# Patient Record
Sex: Female | Born: 1935 | Race: White | Hispanic: No | Smoking: Never smoker
Health system: Southern US, Community
[De-identification: ages and names within clinical notes are randomized; demographics above are authoritative.]

## PROBLEM LIST (undated history)

## (undated) DIAGNOSIS — I471 Supraventricular tachycardia: Secondary | ICD-10-CM

## (undated) DIAGNOSIS — E785 Hyperlipidemia, unspecified: Secondary | ICD-10-CM

## (undated) DIAGNOSIS — N390 Urinary tract infection, site not specified: Secondary | ICD-10-CM

## (undated) DIAGNOSIS — F419 Anxiety disorder, unspecified: Secondary | ICD-10-CM

## (undated) DIAGNOSIS — M19049 Primary osteoarthritis, unspecified hand: Secondary | ICD-10-CM

## (undated) DIAGNOSIS — E039 Hypothyroidism, unspecified: Secondary | ICD-10-CM

## (undated) DIAGNOSIS — C719 Malignant neoplasm of brain, unspecified: Secondary | ICD-10-CM

## (undated) DIAGNOSIS — K635 Polyp of colon: Secondary | ICD-10-CM

## (undated) DIAGNOSIS — A419 Sepsis, unspecified organism: Secondary | ICD-10-CM

## (undated) DIAGNOSIS — M1711 Unilateral primary osteoarthritis, right knee: Secondary | ICD-10-CM

## (undated) HISTORY — PX: KNEE ARTHROSCOPY: SUR90

## (undated) HISTORY — PX: ABDOMINAL HYSTERECTOMY: SHX81

## (undated) HISTORY — PX: CHOLECYSTECTOMY: SHX55

---

## 1999-02-16 ENCOUNTER — Other Ambulatory Visit: Admission: RE | Admit: 1999-02-16 | Discharge: 1999-02-16 | Payer: Self-pay | Admitting: Family Medicine

## 1999-12-04 ENCOUNTER — Encounter: Payer: Self-pay | Admitting: Family Medicine

## 1999-12-04 ENCOUNTER — Ambulatory Visit (HOSPITAL_COMMUNITY): Admission: RE | Admit: 1999-12-04 | Discharge: 1999-12-04 | Payer: Self-pay | Admitting: Family Medicine

## 2000-02-26 ENCOUNTER — Other Ambulatory Visit: Admission: RE | Admit: 2000-02-26 | Discharge: 2000-02-26 | Payer: Self-pay | Admitting: Family Medicine

## 2000-04-10 ENCOUNTER — Encounter: Admission: RE | Admit: 2000-04-10 | Discharge: 2000-04-21 | Payer: Self-pay | Admitting: Orthopedic Surgery

## 2001-01-22 ENCOUNTER — Encounter: Payer: Self-pay | Admitting: Internal Medicine

## 2001-01-22 ENCOUNTER — Ambulatory Visit (HOSPITAL_COMMUNITY): Admission: RE | Admit: 2001-01-22 | Discharge: 2001-01-22 | Payer: Self-pay | Admitting: Internal Medicine

## 2001-04-30 ENCOUNTER — Emergency Department (HOSPITAL_COMMUNITY): Admission: EM | Admit: 2001-04-30 | Discharge: 2001-04-30 | Payer: Self-pay | Admitting: Emergency Medicine

## 2001-05-19 ENCOUNTER — Other Ambulatory Visit: Admission: RE | Admit: 2001-05-19 | Discharge: 2001-05-19 | Payer: Self-pay | Admitting: Internal Medicine

## 2001-05-22 ENCOUNTER — Encounter: Admission: RE | Admit: 2001-05-22 | Discharge: 2001-05-22 | Payer: Self-pay | Admitting: Internal Medicine

## 2001-05-22 ENCOUNTER — Encounter: Payer: Self-pay | Admitting: Internal Medicine

## 2002-03-30 ENCOUNTER — Encounter: Payer: Self-pay | Admitting: Internal Medicine

## 2002-03-30 ENCOUNTER — Ambulatory Visit (HOSPITAL_COMMUNITY): Admission: RE | Admit: 2002-03-30 | Discharge: 2002-03-30 | Payer: Self-pay | Admitting: Internal Medicine

## 2002-08-10 ENCOUNTER — Ambulatory Visit (HOSPITAL_COMMUNITY): Admission: RE | Admit: 2002-08-10 | Discharge: 2002-08-10 | Payer: Self-pay | Admitting: Gastroenterology

## 2002-08-10 ENCOUNTER — Encounter (INDEPENDENT_AMBULATORY_CARE_PROVIDER_SITE_OTHER): Payer: Self-pay | Admitting: Specialist

## 2003-10-20 ENCOUNTER — Ambulatory Visit (HOSPITAL_COMMUNITY): Admission: RE | Admit: 2003-10-20 | Discharge: 2003-10-20 | Payer: Self-pay | Admitting: Internal Medicine

## 2004-05-28 ENCOUNTER — Other Ambulatory Visit: Admission: RE | Admit: 2004-05-28 | Discharge: 2004-05-28 | Payer: Self-pay | Admitting: Internal Medicine

## 2005-07-19 ENCOUNTER — Ambulatory Visit (HOSPITAL_COMMUNITY): Admission: RE | Admit: 2005-07-19 | Discharge: 2005-07-19 | Payer: Self-pay | Admitting: Internal Medicine

## 2006-07-31 ENCOUNTER — Ambulatory Visit (HOSPITAL_COMMUNITY): Admission: RE | Admit: 2006-07-31 | Discharge: 2006-07-31 | Payer: Self-pay | Admitting: Internal Medicine

## 2007-06-19 ENCOUNTER — Other Ambulatory Visit: Admission: RE | Admit: 2007-06-19 | Discharge: 2007-06-19 | Payer: Self-pay | Admitting: Internal Medicine

## 2007-08-04 ENCOUNTER — Ambulatory Visit (HOSPITAL_COMMUNITY): Admission: RE | Admit: 2007-08-04 | Discharge: 2007-08-04 | Payer: Self-pay | Admitting: Internal Medicine

## 2008-08-05 ENCOUNTER — Ambulatory Visit (HOSPITAL_COMMUNITY): Admission: RE | Admit: 2008-08-05 | Discharge: 2008-08-05 | Payer: Self-pay | Admitting: Internal Medicine

## 2009-08-11 ENCOUNTER — Ambulatory Visit (HOSPITAL_COMMUNITY): Admission: RE | Admit: 2009-08-11 | Discharge: 2009-08-11 | Payer: Self-pay | Admitting: Internal Medicine

## 2009-08-16 ENCOUNTER — Encounter: Admission: RE | Admit: 2009-08-16 | Discharge: 2009-08-16 | Payer: Self-pay | Admitting: Internal Medicine

## 2010-08-17 ENCOUNTER — Encounter: Admission: RE | Admit: 2010-08-17 | Discharge: 2010-08-17 | Payer: Self-pay | Admitting: Internal Medicine

## 2010-11-11 ENCOUNTER — Encounter: Payer: Self-pay | Admitting: Internal Medicine

## 2011-03-08 NOTE — Op Note (Signed)
   NAMEHALSEY, PERSAUD                           ACCOUNT NO.:  [id number]   MEDICAL RECORD NO.:  [id number]                   PATIENT TYPE:  AMB   LOCATION:  ENDO                                 FACILITY:  [facility]   PHYSICIAN:  Danise Edge, M.D.                DATE OF BIRTH:  1932-03-17   DATE OF PROCEDURE:  08/10/2002  DATE OF DISCHARGE:                                 OPERATIVE REPORT   PROCEDURE:  Screening colonoscopy.   PROCEDURE INDICATION:  The patient is a 77 year old female born 08/23/1932.  The  patient is scheduled to undergo her first screening colonoscopy with  polypectomy to prevent colon cancer.  She has undergone screening flexible  proctosigmoidoscopies in the past and has no past history of colorectal  neoplasia.   I discussed with the patient the complications associated with colonoscopy  and polypectomy including a 15:1000 risk of bleeding a 4:1000 risk of colon  perforation requiring surgical repair.  The patient has signed the operative  permit.   ENDOSCOPIST:  Danise Edge, M.D.   PREMEDICATION:  Versed 10 mg, fentanyl 50 mcg.   ENDOSCOPE:  Olympus pediatric colonoscope.   DESCRIPTION OF PROCEDURE:  After obtaining informed consent, the patient was  placed in the left lateral decubitus position.  I administered intravenous  Versed and intravenous fentanyl to achieve conscious sedation for the  procedure.  The patient's blood pressure, oxygen saturation, and cardiac  rhythm were monitored throughout the procedure and documented in the medical  record.   Anal inspection was normal.  Digital rectal exam was normal.  The Olympus  pediatric videocolonoscope was introduced into the rectum and advanced to  the cecum.  Colonic preparation for the exam today was excellent.   Rectum:  Normal.  Sigmoid colon and descending colon:  Normal.  Splenic flexure:  Normal.  Transverse colon:  Normal.  Hepatic flexure:  Normal  Ascending colon:  From the proximal  ascending colon, a 0.5-mm sessile polyp  was removed with the cold biopsy forceps.  Cecum and ileocecal valve:  Normal.    ASSESSMENT:  From the proximal ascending colon, a diminutive polyp was  removed with the cold biopsy forceps; otherwise normal proctocolonoscopy to  the cecum.                                                Danise Edge, M.D.    MJ/MEDQ  D:  08/10/2002  T:  08/10/2002  Job:  [id number]   cc:   Georgann Housekeeper, M.D.  [address]  [place]  [place] [address]  Fax: [phone]

## 2011-07-19 ENCOUNTER — Other Ambulatory Visit: Payer: Self-pay | Admitting: Internal Medicine

## 2011-07-19 DIAGNOSIS — Z1231 Encounter for screening mammogram for malignant neoplasm of breast: Secondary | ICD-10-CM

## 2011-08-19 ENCOUNTER — Ambulatory Visit
Admission: RE | Admit: 2011-08-19 | Discharge: 2011-08-19 | Disposition: A | Discharge status: Home / Self Care | Payer: Medicare Other | Source: Ambulatory Visit | Attending: Internal Medicine | Admitting: Internal Medicine

## 2011-08-19 ENCOUNTER — Ambulatory Visit: Payer: Self-pay

## 2011-08-19 DIAGNOSIS — Z1231 Encounter for screening mammogram for malignant neoplasm of breast: Secondary | ICD-10-CM

## 2012-07-29 ENCOUNTER — Other Ambulatory Visit (HOSPITAL_COMMUNITY): Payer: Self-pay | Admitting: Internal Medicine

## 2012-07-29 DIAGNOSIS — Z1231 Encounter for screening mammogram for malignant neoplasm of breast: Secondary | ICD-10-CM

## 2012-08-20 ENCOUNTER — Ambulatory Visit (HOSPITAL_COMMUNITY)
Admission: RE | Admit: 2012-08-20 | Discharge: 2012-08-20 | Disposition: A | Discharge status: Home / Self Care | Payer: Medicare Other | Source: Ambulatory Visit | Attending: Internal Medicine | Admitting: Internal Medicine

## 2012-08-20 DIAGNOSIS — Z1231 Encounter for screening mammogram for malignant neoplasm of breast: Secondary | ICD-10-CM

## 2012-10-21 DIAGNOSIS — N390 Urinary tract infection, site not specified: Secondary | ICD-10-CM

## 2012-10-21 DIAGNOSIS — I471 Supraventricular tachycardia: Secondary | ICD-10-CM

## 2012-10-21 HISTORY — DX: Supraventricular tachycardia: I47.1

## 2012-10-21 HISTORY — DX: Urinary tract infection, site not specified: N39.0

## 2013-03-26 ENCOUNTER — Inpatient Hospital Stay (HOSPITAL_COMMUNITY): Payer: Medicare Other

## 2013-03-26 ENCOUNTER — Inpatient Hospital Stay (HOSPITAL_COMMUNITY)
Admission: EM | Admit: 2013-03-26 | Discharge: 2013-04-02 | DRG: 025 | Disposition: A | Discharge status: Rehabilitation Facility | Payer: Medicare Other | Attending: Internal Medicine | Admitting: Internal Medicine

## 2013-03-26 ENCOUNTER — Other Ambulatory Visit: Payer: Self-pay | Admitting: Neurological Surgery

## 2013-03-26 ENCOUNTER — Emergency Department (HOSPITAL_COMMUNITY): Payer: Medicare Other

## 2013-03-26 ENCOUNTER — Encounter (HOSPITAL_COMMUNITY): Payer: Self-pay | Admitting: *Deleted

## 2013-03-26 DIAGNOSIS — R4182 Altered mental status, unspecified: Secondary | ICD-10-CM

## 2013-03-26 DIAGNOSIS — D496 Neoplasm of unspecified behavior of brain: Secondary | ICD-10-CM

## 2013-03-26 DIAGNOSIS — R000 Tachycardia, unspecified: Secondary | ICD-10-CM | POA: Diagnosis not present

## 2013-03-26 DIAGNOSIS — E785 Hyperlipidemia, unspecified: Secondary | ICD-10-CM | POA: Diagnosis present

## 2013-03-26 DIAGNOSIS — G9389 Other specified disorders of brain: Secondary | ICD-10-CM

## 2013-03-26 DIAGNOSIS — G8190 Hemiplegia, unspecified affecting unspecified side: Secondary | ICD-10-CM | POA: Diagnosis present

## 2013-03-26 DIAGNOSIS — E039 Hypothyroidism, unspecified: Secondary | ICD-10-CM

## 2013-03-26 DIAGNOSIS — R34 Anuria and oliguria: Secondary | ICD-10-CM | POA: Diagnosis not present

## 2013-03-26 DIAGNOSIS — G936 Cerebral edema: Secondary | ICD-10-CM | POA: Diagnosis present

## 2013-03-26 DIAGNOSIS — F411 Generalized anxiety disorder: Secondary | ICD-10-CM | POA: Diagnosis present

## 2013-03-26 DIAGNOSIS — R7309 Other abnormal glucose: Secondary | ICD-10-CM | POA: Diagnosis not present

## 2013-03-26 DIAGNOSIS — K219 Gastro-esophageal reflux disease without esophagitis: Secondary | ICD-10-CM | POA: Diagnosis present

## 2013-03-26 DIAGNOSIS — D320 Benign neoplasm of cerebral meninges: Principal | ICD-10-CM | POA: Diagnosis present

## 2013-03-26 DIAGNOSIS — T380X5A Adverse effect of glucocorticoids and synthetic analogues, initial encounter: Secondary | ICD-10-CM | POA: Diagnosis not present

## 2013-03-26 DIAGNOSIS — G939 Disorder of brain, unspecified: Secondary | ICD-10-CM

## 2013-03-26 DIAGNOSIS — R471 Dysarthria and anarthria: Secondary | ICD-10-CM | POA: Diagnosis present

## 2013-03-26 DIAGNOSIS — I10 Essential (primary) hypertension: Secondary | ICD-10-CM | POA: Diagnosis present

## 2013-03-26 DIAGNOSIS — D649 Anemia, unspecified: Secondary | ICD-10-CM | POA: Diagnosis not present

## 2013-03-26 HISTORY — DX: Unilateral primary osteoarthritis, right knee: M17.11

## 2013-03-26 HISTORY — DX: Hypothyroidism, unspecified: E03.9

## 2013-03-26 HISTORY — DX: Polyp of colon: K63.5

## 2013-03-26 HISTORY — DX: Hyperlipidemia, unspecified: E78.5

## 2013-03-26 HISTORY — DX: Primary osteoarthritis, unspecified hand: M19.049

## 2013-03-26 HISTORY — DX: Anxiety disorder, unspecified: F41.9

## 2013-03-26 LAB — POCT I-STAT, CHEM 8
BUN: 18 mg/dL (ref 6–23)
Calcium, Ion: 1.17 mmol/L (ref 1.13–1.30)
Glucose, Bld: 189 mg/dL — ABNORMAL HIGH (ref 70–99)
HCT: 43 % (ref 36.0–46.0)
Hemoglobin: 14.6 g/dL (ref 12.0–15.0)
Potassium: 4 mEq/L (ref 3.5–5.1)
Sodium: 138 mEq/L (ref 135–145)

## 2013-03-26 LAB — CBC WITH DIFFERENTIAL/PLATELET
Eosinophils Relative: 0 % (ref 0–5)
HCT: 40 % (ref 36.0–46.0)
Lymphocytes Relative: 8 % — ABNORMAL LOW (ref 12–46)
Lymphs Abs: 1 10*3/uL (ref 0.7–4.0)
MCHC: 35 g/dL (ref 30.0–36.0)
Neutro Abs: 10.7 10*3/uL — ABNORMAL HIGH (ref 1.7–7.7)
Neutrophils Relative %: 91 % — ABNORMAL HIGH (ref 43–77)
Platelets: 218 10*3/uL (ref 150–400)
RBC: 4.65 MIL/uL (ref 3.87–5.11)
RDW: 13.2 % (ref 11.5–15.5)

## 2013-03-26 LAB — URINE MICROSCOPIC-ADD ON

## 2013-03-26 LAB — COMPREHENSIVE METABOLIC PANEL
ALT: 26 U/L (ref 0–35)
Alkaline Phosphatase: 64 U/L (ref 39–117)
Chloride: 95 mEq/L — ABNORMAL LOW (ref 96–112)
GFR calc Af Amer: >90 mL/min (ref >90)
GFR calc non Af Amer: 83 mL/min — ABNORMAL LOW (ref >90)
Total Bilirubin: 0.6 mg/dL (ref 0.3–1.2)

## 2013-03-26 LAB — URINALYSIS, ROUTINE W REFLEX MICROSCOPIC
Glucose, UA: 100 mg/dL — AB
Leukocytes, UA: NEGATIVE
Protein, ur: >300 mg/dL — AB
Urobilinogen, UA: 0.2 mg/dL (ref 0.0–1.0)
pH: 7.5 (ref 5.0–8.0)

## 2013-03-26 LAB — TSH: TSH: 1.135 u[IU]/mL (ref 0.350–4.500)

## 2013-03-26 LAB — TROPONIN I: Troponin I: <0.3 ng/mL (ref <0.30)

## 2013-03-26 MED ORDER — PANTOPRAZOLE SODIUM 40 MG PO TBEC
40.0000 mg | DELAYED_RELEASE_TABLET | Freq: Every day | ORAL | Status: DC
  Administered 2013-03-26 – 2013-03-29 (×4): 40 mg via ORAL
  Filled 2013-03-26 (×4): qty 1

## 2013-03-26 MED ORDER — HYDROMORPHONE HCL PF 1 MG/ML IJ SOLN: 1.0000 mg | INTRAMUSCULAR | Status: DC | PRN

## 2013-03-26 MED ORDER — GADOBENATE DIMEGLUMINE 529 MG/ML IV SOLN
13.0000 mL | Freq: Once | INTRAVENOUS | Status: AC
  Administered 2013-03-26: 13 mL via INTRAVENOUS

## 2013-03-26 MED ORDER — ACETAMINOPHEN 325 MG PO TABS
650.0000 mg | ORAL_TABLET | Freq: Four times a day (QID) | ORAL | Status: DC | PRN
  Filled 2013-03-26: qty 2

## 2013-03-26 MED ORDER — SIMVASTATIN 20 MG PO TABS
20.0000 mg | ORAL_TABLET | Freq: Every evening | ORAL | Status: DC
  Administered 2013-03-26 – 2013-03-31 (×6): 20 mg via ORAL
  Filled 2013-03-26 (×7): qty 1

## 2013-03-26 MED ORDER — SODIUM CHLORIDE 0.9 % IJ SOLN
3.0000 mL | Freq: Two times a day (BID) | INTRAMUSCULAR | Status: DC
  Administered 2013-03-27 – 2013-04-01 (×8): 3 mL via INTRAVENOUS

## 2013-03-26 MED ORDER — ONDANSETRON HCL 4 MG/2ML IJ SOLN: 4.0000 mg | Freq: Four times a day (QID) | INTRAMUSCULAR | Status: DC | PRN

## 2013-03-26 MED ORDER — LEVOTHYROXINE SODIUM 75 MCG PO TABS
75.0000 ug | ORAL_TABLET | Freq: Every day | ORAL | Status: DC
  Administered 2013-03-27 – 2013-04-01 (×5): 75 ug via ORAL
  Filled 2013-03-26 (×7): qty 1

## 2013-03-26 MED ORDER — MAGNESIUM CHLORIDE 64 MG PO TBEC
1.0000 | DELAYED_RELEASE_TABLET | Freq: Every day | ORAL | Status: DC
  Administered 2013-03-27 – 2013-04-01 (×5): 64 mg via ORAL
  Filled 2013-03-26 (×7): qty 1

## 2013-03-26 MED ORDER — DEXAMETHASONE SODIUM PHOSPHATE 4 MG/ML IJ SOLN
12.0000 mg | Freq: Four times a day (QID) | INTRAMUSCULAR | Status: DC
  Administered 2013-03-27 – 2013-03-29 (×13): 12 mg via INTRAVENOUS
  Administered 2013-03-30: 10 mg via INTRAVENOUS
  Administered 2013-03-30: 12 mg via INTRAVENOUS
  Filled 2013-03-26 (×15): qty 3
  Filled 2013-03-26: qty 1
  Filled 2013-03-26 (×2): qty 3

## 2013-03-26 MED ORDER — LABETALOL HCL 5 MG/ML IV SOLN
10.0000 mg | INTRAVENOUS | Status: DC | PRN
  Administered 2013-03-30 (×6): 5 mg via INTRAVENOUS
  Administered 2013-03-30: 10 mg via INTRAVENOUS

## 2013-03-26 MED ORDER — ONDANSETRON HCL 4 MG PO TABS: 4.0000 mg | ORAL_TABLET | Freq: Four times a day (QID) | ORAL | Status: DC | PRN

## 2013-03-26 MED ORDER — ONDANSETRON HCL 4 MG/2ML IJ SOLN
4.0000 mg | Freq: Three times a day (TID) | INTRAMUSCULAR | Status: DC | PRN
  Administered 2013-03-26: 4 mg via INTRAVENOUS
  Filled 2013-03-26 (×2): qty 2

## 2013-03-26 MED ORDER — DEXAMETHASONE SODIUM PHOSPHATE 10 MG/ML IJ SOLN
10.0000 mg | Freq: Once | INTRAMUSCULAR | Status: AC
  Administered 2013-03-26: 10 mg via INTRAVENOUS
  Filled 2013-03-26: qty 1

## 2013-03-26 MED ORDER — SODIUM CHLORIDE 0.9 % IV SOLN
INTRAVENOUS | Status: AC
  Administered 2013-03-26: 06:00:00 via INTRAVENOUS

## 2013-03-26 MED ORDER — SODIUM CHLORIDE 0.9 % IV SOLN
INTRAVENOUS | Status: AC
  Administered 2013-03-26: 12:00:00 via INTRAVENOUS

## 2013-03-26 MED ORDER — SODIUM CHLORIDE 0.9 % IV SOLN
500.0000 mg | Freq: Two times a day (BID) | INTRAVENOUS | Status: DC
  Administered 2013-03-26 – 2013-03-30 (×9): 500 mg via INTRAVENOUS
  Filled 2013-03-26 (×10): qty 5

## 2013-03-26 MED ORDER — DEXAMETHASONE SODIUM PHOSPHATE 4 MG/ML IJ SOLN
4.0000 mg | Freq: Four times a day (QID) | INTRAMUSCULAR | Status: DC
  Administered 2013-03-26 (×2): 4 mg via INTRAVENOUS
  Filled 2013-03-26 (×4): qty 1

## 2013-03-26 MED ORDER — SODIUM CHLORIDE 0.9 % IV BOLUS (SEPSIS)
500.0000 mL | Freq: Once | INTRAVENOUS | Status: AC
  Administered 2013-03-26: 500 mL via INTRAVENOUS

## 2013-03-26 MED ORDER — HYDROMORPHONE HCL PF 1 MG/ML IJ SOLN: 0.5000 mg | INTRAMUSCULAR | Status: DC | PRN

## 2013-03-26 MED ORDER — OMEGA-3-ACID ETHYL ESTERS 1 G PO CAPS
2.0000 g | ORAL_CAPSULE | Freq: Every day | ORAL | Status: DC
  Filled 2013-03-26: qty 2

## 2013-03-26 MED ORDER — ALPRAZOLAM 0.5 MG PO TABS
1.0000 mg | ORAL_TABLET | Freq: Two times a day (BID) | ORAL | Status: DC
  Administered 2013-03-26: 1 mg via ORAL
  Filled 2013-03-26 (×2): qty 1

## 2013-03-26 MED ORDER — FAMOTIDINE IN NACL 20-0.9 MG/50ML-% IV SOLN
20.0000 mg | Freq: Two times a day (BID) | INTRAVENOUS | Status: DC
  Administered 2013-03-26 – 2013-03-27 (×2): 20 mg via INTRAVENOUS
  Filled 2013-03-26 (×3): qty 50

## 2013-03-26 MED ORDER — FISH OIL 1000 MG PO CAPS: 1000.0000 mg | ORAL_CAPSULE | Freq: Two times a day (BID) | ORAL | Status: DC

## 2013-03-26 MED ORDER — ACETAMINOPHEN 650 MG RE SUPP: 650.0000 mg | Freq: Four times a day (QID) | RECTAL | Status: DC | PRN

## 2013-03-26 MED ORDER — OMEGA-3-ACID ETHYL ESTERS 1 G PO CAPS
1.0000 g | ORAL_CAPSULE | Freq: Every day | ORAL | Status: DC
  Filled 2013-03-26: qty 1

## 2013-03-26 NOTE — H&P (Signed)
[facility] History and Physical  Earlee Herald NWG:[id number] DOB: January 19, 1932 DOA: 03/26/2013  Referring physician: ER physician. PCP: No primary provider on file. Dr. Tyson Dense.  Chief Complaint: Right extremity weakness and vomiting.  HPI: Meghan Yu is a 79 y.o. female with known history of hyperlipidemia and hypothyroidism was noticed by family that patient was not using her right side since yesterday morning. Patient also had vomiting with headache. Patient had 2-3 falls also. By evening patient's family noticed that patient's symptoms were getting more persistent and was brought to the ER. CT head shows large frontotemporal brain mass with vasogenic edema and midline shift. This time on-call neurosurgeon Dr. Yetta Barre was consulted by ER physician. Dr. Laural Benes as requested Decadron 10 mg IV stat followed by 4 mg IV every 6 and MRI brain with and without contrast. Patient will be admitted for further management.  Review of Systems: As presented in the history of presenting illness, rest negative.  Past Medical History  Diagnosis Date  . Hypothyroidism   . Hyperlipidemia    Past Surgical History  Procedure Laterality Date  . Knee arthroscopy    . Cholecystectomy    . Abdominal hysterectomy     Social History:  reports that she has never smoked. She does not have any smokeless tobacco history on file. She reports that she does not drink alcohol or use illicit drugs. At home. where does patient live-- Can do ADLs. Can patient participate in ADLs?  No Known Allergies  Family History  Problem Relation Age of Onset  . Stroke Mother       Prior to Admission medications   Medication Sig Start Date End Date Taking? Authorizing Provider  ALPRAZolam Prudy Feeler) 1 MG tablet Take 1 mg by mouth 2 (two) times daily.   Yes Historical Provider, MD  calcium carbonate (OS-CAL) 600 MG TABS Take 600 mg by mouth 2 (two) times daily with a meal.   Yes Historical Provider, MD   Cholecalciferol (VITAMIN D) 2000 UNITS CAPS Take 1 capsule by mouth daily.   Yes Historical Provider, MD  esomeprazole (NEXIUM) 40 MG capsule Take 40 mg by mouth daily before breakfast.   Yes Historical Provider, MD  estradiol (ESTRACE) 1 MG tablet Take 1 mg by mouth daily.   Yes Historical Provider, MD  levothyroxine (SYNTHROID, LEVOTHROID) 75 MCG tablet Take 75 mcg by mouth daily before breakfast.   Yes Historical Provider, MD  magnesium chloride (SLOW-MAG) 64 MG TBEC Take 1 tablet by mouth daily.   Yes Historical Provider, MD  Multiple Vitamin (MULTIVITAMIN WITH MINERALS) TABS Take 1 tablet by mouth daily.   Yes Historical Provider, MD  Omega-3 Fatty Acids (FISH OIL) 1000 MG CAPS Take 1,000-2,000 mg by mouth 2 (two) times daily. Takes 2000 mg in the morning and 1000 mg in the evening   Yes Historical Provider, MD  simvastatin (ZOCOR) 20 MG tablet Take 20 mg by mouth every evening.   Yes Historical Provider, MD  sulindac (CLINORIL) 200 MG tablet Take 200 mg by mouth daily.   Yes Historical Provider, MD   Physical Exam: Filed Vitals:   03/26/13 0308 03/26/13 0328 03/26/13 0549 03/26/13 0600  BP: 183/101 161/90 162/92 150/75  Pulse: 137 125 121 119  Temp: 98.7 F (37.1 C)     TempSrc: Oral     Resp:  29 28 27   Height:  5\' 1"  (1.549 m)    Weight:  58.968 kg (130 lb)    SpO2: 95% 95% 95% 93%  General:  Well-developed and nourished.  Eyes: Anicteric no pallor.  ENT: No discharge from the ears eyes nose mouth.  Neck: No mass felt.  Cardiovascular: S1-S2 heard tachycardic.  Respiratory: No rhonchi or crepitations.  Abdomen: Soft nontender bowel sounds present.  Skin: No rash.  Musculoskeletal: No edema.  Psychiatric: Appears normal.  Neurologic: Alert awake oriented to time place and person. Mild weakness of the right upper and lower extremities. Right facial droop. Tongue is midline. PERRLA positive.  Labs on Admission:  Basic Metabolic Panel:  Recent Labs Lab  03/26/13 0327  NA 138  K 4.0  CL 100  GLUCOSE 189*  BUN 18  CREATININE 0.70   Liver Function Tests: No results found for this basename: AST, ALT, ALKPHOS, BILITOT, PROT, ALBUMIN,  in the last 168 hours No results found for this basename: LIPASE, AMYLASE,  in the last 168 hours No results found for this basename: AMMONIA,  in the last 168 hours CBC:  Recent Labs Lab 03/26/13 0320 03/26/13 0327  WBC 11.8*  --   NEUTROABS 10.7*  --   HGB 14.0 14.6  HCT 40.0 43.0  MCV 86.0  --   PLT 218  --    Cardiac Enzymes:  Recent Labs Lab 03/26/13 0340  TROPONINI <0.30    BNP (last 3 results) No results found for this basename: PROBNP,  in the last 8760 hours CBG: No results found for this basename: GLUCAP,  in the last 168 hours  Radiological Exams on Admission: Dg Chest 2 View  03/26/2013   *RADIOLOGY REPORT*  Clinical Data: Nausea and vomiting.  Confusion and right-sided facial droop.  CHEST - 2 VIEW  Comparison: None.  Findings: The lungs are mildly hypoexpanded; minimal left-sided atelectasis is seen.  No definite pleural effusion or pneumothorax is seen.  The heart is normal in size; the mediastinal contour is within normal limits.  No acute osseous abnormalities are seen.  Clips are noted within the right upper quadrant, reflecting prior cholecystectomy.  IMPRESSION: Lungs mildly hypoexpanded, with minimal left-sided atelectasis.   Original Report Authenticated By: Tonia Ghent, M.D.   Ct Head Wo Contrast  03/26/2013   *RADIOLOGY REPORT*  Clinical Data: Nausea and vomiting.  History of recent falls for 2 weeks.  Right-sided weakness, confusion and right-sided facial droop.  CT HEAD WITHOUT CONTRAST  Technique:  Contiguous axial images were obtained from the base of the skull through the vertex without contrast.  Comparison: None.  Findings:   There is a large mildly hyperdense and slightly heterogeneous mass noted at the high left frontoparietal region, measuring approximately 5.0 x  4.4 cm, abutting the left anterior falx cerebri.  Marked surrounding vasogenic edema is noted, extending inferiorly along the left frontal lobe.  The appearance is suspicious for a large meningioma, though MRI would be helpful for further evaluation.  1.3 cm of rightward midline shift is noted, with likely subfalcine herniation, and there is mass effect on the frontal horn of the left lateral ventricle.  No definite hemorrhagic transformation is seen.  Small chronic lacunar infarcts are seen within the left basal ganglia.  The posterior fossa, including the cerebellum, brainstem and fourth ventricle, is within normal limits.  There is no evidence of hydrocephalus.  There is no evidence of fracture; visualized osseous structures are unremarkable in appearance.  The orbits are within normal limits. The paranasal sinuses and mastoid air cells are well-aerated.  No significant soft tissue abnormalities are seen.  IMPRESSION:  1.  Large 5.0 x  4.4 cm mildly hyperdense mass at the high left frontoparietal region, abutting the left anterior falx cerebri. Marked surrounding vasogenic edema noted.  This is suspicious for a large meningioma, though MRI is recommended for further evaluation.  1.3 cm of rightward midline shift noted, with likely subfalcine herniation, and mass effect on the frontal horn of the left lateral ventricle.  No definite hemorrhagic transformation seen.  2.  Small chronic lacunar infarcts within the left basal ganglia.  These results were called by telephone on 03/26/2013 at 04:59 a.m. to Dr. Geoffery Lyons, who verbally acknowledged these results.   Original Report Authenticated By: Tonia Ghent, M.D.     Assessment/Plan Principal Problem:   Brain tumor Active Problems:   Hypothyroidism   Hyperlipidemia   1. Brain tumor with vasogenic edema and midline shift - neurosurgeon on call Dr. Yetta Barre has been consulted and patient has been placed on Decadron IV with neuro checks. MRI brain with and  without contrast has been ordered. Family has requested Dr. Danielle Dess neurosurgeon and have conveyed this message to Dr. Yetta Barre. 2. History of hypothyroidism and hyperlipidemia - continue home medications.    Code Status: Full code.  Family Communication: Patient's husband and daughter at the bedside.  Disposition Plan: Admit to inpatient.    Averyana Pillars N. [facility] Pager [phone].  If 7PM-7AM, please contact night-coverage [email or web address] Password [facility] 03/26/2013, 6:26 AM

## 2013-03-26 NOTE — ED Notes (Signed)
Pt returned from MRI. Failed swallow screen per previous RN. Called 3 W, reporting ready for patient. Will transport patient upstairs.

## 2013-03-26 NOTE — Consult Note (Addendum)
PULMONARY  / CRITICAL CARE MEDICINE  Name: Meghan Yu MRN: [id number] DOB: 12-08-1931    ADMISSION DATE:  03/26/2013 CONSULTATION DATE:  03/26/13 / [name] MD :  Barnett Abu PRIMARY SERVICE: Neurosurgery  CHIEF COMPLAINT:  Right hemiparesis and lethargy   BRIEF PATIENT DESCRIPTION:  79 years old female with PMH relevant for hypothyroidism and dyslipidemia. Admitted with right hemiparesis and lethargy. Brain imaging showed a large left frontotemporal mass with vasogenic edema and midline shift.  SIGNIFICANT EVENTS / STUDIES:  MRI of the brain Findings consistent with a left hemispheric frontal and posterior  frontal extra-axial mass, likely meningioma. There is invasion of  the superior sagittal sinus without occlusion. Moderate associated  edema in the left frontal and posterior frontal white matter with  left to right shift of approximately 1 cm.   LINES / TUBES: Peripheral IV's  CULTURES: Not ordered  ANTIBIOTICS: No antibiotics  HISTORY OF PRESENT ILLNESS:   79 years old female with PMH relevant for hypothyroidism and dyslipidemia. Admitted with right hemiparesis and lethargy. Brain imaging showed a large left frontotemporal mass with vasogenic edema and midline shift. Started on IV decadron by neurosurgery. Over the course of the day she has worsened lethargy and right hemiparesis. Her vital signs are relevant for HTN and tachycardia. At the time of my exam the patient is lethargic but arousable, she is oriented  X 3 and is saturating fine on RA. Per Neurosurgery note, the patient is scheduled for surgical resection on Tuesday.  PAST MEDICAL HISTORY :  Past Medical History  Diagnosis Date  . Hypothyroidism   . Hyperlipidemia    Past Surgical History  Procedure Laterality Date  . Knee arthroscopy    . Cholecystectomy    . Abdominal hysterectomy     Prior to Admission medications   Medication Sig Start Date End Date Taking? Authorizing Provider  ALPRAZolam  Prudy Feeler) 1 MG tablet Take 1 mg by mouth 2 (two) times daily.   Yes Historical Provider, MD  calcium carbonate (OS-CAL) 600 MG TABS Take 600 mg by mouth 2 (two) times daily with a meal.   Yes Historical Provider, MD  Cholecalciferol (VITAMIN D) 2000 UNITS CAPS Take 1 capsule by mouth daily.   Yes Historical Provider, MD  esomeprazole (NEXIUM) 40 MG capsule Take 40 mg by mouth daily before breakfast.   Yes Historical Provider, MD  estradiol (ESTRACE) 1 MG tablet Take 1 mg by mouth daily.   Yes Historical Provider, MD  levothyroxine (SYNTHROID, LEVOTHROID) 75 MCG tablet Take 75 mcg by mouth daily before breakfast.   Yes Historical Provider, MD  magnesium chloride (SLOW-MAG) 64 MG TBEC Take 1 tablet by mouth daily.   Yes Historical Provider, MD  Multiple Vitamin (MULTIVITAMIN WITH MINERALS) TABS Take 1 tablet by mouth daily.   Yes Historical Provider, MD  Omega-3 Fatty Acids (FISH OIL) 1000 MG CAPS Take 1,000-2,000 mg by mouth 2 (two) times daily. Takes 2000 mg in the morning and 1000 mg in the evening   Yes Historical Provider, MD  simvastatin (ZOCOR) 20 MG tablet Take 20 mg by mouth every evening.   Yes Historical Provider, MD  sulindac (CLINORIL) 200 MG tablet Take 200 mg by mouth daily.   Yes Historical Provider, MD   No Known Allergies  FAMILY HISTORY:  Family History  Problem Relation Age of Onset  . Stroke Mother    SOCIAL HISTORY:  reports that she has never smoked. She does not have any smokeless tobacco history on file. She reports  that she does not drink alcohol or use illicit drugs.  REVIEW OF SYSTEMS:   All systems reviewed and found negative except for what I mentioned in the HPI.  SUBJECTIVE:   VITAL SIGNS: Temp:  [98.5 F (36.9 C)-99.8 F (37.7 C)] 99.6 F (37.6 C) (06/06 2000) Pulse Rate:  [117-137] 120 (06/06 2000) Resp:  [16-29] 16 (06/06 2000) BP: (150-183)/(75-101) 170/101 mmHg (06/06 2000) SpO2:  [92 %-96 %] 92 % (06/06 2000) Weight:  [130 lb (58.968 kg)-134 lb  (60.782 kg)] 134 lb (60.782 kg) (06/06 0835) HEMODYNAMICS:   VENTILATOR SETTINGS:   INTAKE / OUTPUT: Intake/Output     06/06 0701 - 06/07 0700   P.O. 360   Total Intake(mL/kg) 360 (5.9)   Urine (mL/kg/hr) 475 (0.5)   Total Output 475   Net -115         PHYSICAL EXAMINATION: General: No apparent distress. Lethargic but arousable Eyes: Anicteric sclerae. ENT: Oropharynx clear. Moist mucous membranes. No thrush Lymph: No cervical, supraclavicular, or axillary lymphadenopathy. Heart: Normal S1, S2. No murmurs, rubs, or gallops appreciated. No bruits, equal pulses. Lungs: Normal excursion, no dullness to percussion. Good air movement bilaterally, without wheezes or crackles. Normal upper airway sounds without evidence of stridor. Abdomen: Abdomen soft, non-tender and not distended, normoactive bowel sounds. No hepatosplenomegaly or masses. Musculoskeletal: No clubbing or synovitis. Skin: No rashes or lesions Neuro: Right facial droop, Dense right hemiparesis. Lethargic. Able to protect her airway. Oriented x 3  LABS:  Recent Labs Lab 03/26/13 0320 03/26/13 0327 03/26/13 0340 03/26/13 1052  HGB 14.0 14.6  --   --   WBC 11.8*  --   --   --   PLT 218  --   --   --   NA  --  138  --  136  K  --  4.0  --  3.5  CL  --  100  --  95*  CO2  --   --   --  26  GLUCOSE  --  189*  --  202*  BUN  --  18  --  15  CREATININE  --  0.70  --  0.64  CALCIUM  --   --   --  9.8  AST  --   --   --  28  ALT  --   --   --  26  ALKPHOS  --   --   --  64  BILITOT  --   --   --  0.6  PROT  --   --   --  8.4*  ALBUMIN  --   --   --  4.6  TROPONINI  --   --  <0.30  --    No results found for this basename: GLUCAP,  in the last 168 hours  CXR:  No acute lung infiltrates.  EKG: Sinus tachycardia. QTc 391  ASSESSMENT / PLAN:  PULMONARY A: 1) No issues. Saturating 92 to 96% on RA.  2) Adequate airway protection P:   - Close monitoring in the ICU for inability to protect her  airway.  CARDIOVASCULAR A:  1) Hypertension and tachycardia of unclear etiology P:  - We will allow SBP up to 180 to maintain brain perfusion.  RENAL A:   1) No issues P:   - Will follow BMP  GASTROINTESTINAL A:   1) No issues. Passed swallow evaluation. P:   - Continue protonix  HEMATOLOGIC A:   1) No issues P:  -  Will follow CBC  INFECTIOUS A:   1) No issues   ENDOCRINE A:   1) Hypothyroidism  P:   - Continue synthroid  NEUROLOGIC A:   1) Right frontal mass with vasogenic edema and midline shift P:   - Continue decadron per neurosurgery - Plan for surgery next Tuesday - AMS  likely related to high dose steroids + mass effect + benzos. Will treat with Haldol PRN.  - Will avoid benzodiazepines    I have personally obtained a history, examined the patient, evaluated laboratory and imaging results, formulated the assessment and plan and placed orders. CRITICAL CARE: Critical Care Time devoted to patient care services described in this note is 45 minutes.   Overton Mam, MD Pulmonary and Critical Care Medicine [facility] Pager: [phone]  03/26/2013, 9:47 PM

## 2013-03-26 NOTE — Progress Notes (Signed)
Patient ID: Meghan Yu, female   DOB: [birth year], 79 y.o.   MRN: [id number] Patient was evaluated today and demonstrates that she has decreased level of consciousness. She is any paretic on the right worse than she was this morning. She is hypertensive and tachycardic. I discussed situation Dr. Pete Glatter and I am transferring the patient to the intensive care unit will ask critical care medicine to see the patient and evaluate her. Surgery is planned for Tuesday. I discussed situation with the family and they're agreeable.

## 2013-03-26 NOTE — Progress Notes (Signed)
Utilization review completed. Nawaf Strange, RN, BSN. 

## 2013-03-26 NOTE — ED Provider Notes (Signed)
History     CSN: [id number]  Arrival date & time 03/26/13  0258   First MD Initiated Contact with Patient 03/26/13 [phone]      Chief Complaint  Patient presents with  . Extremity Weakness  . Stroke Symptoms  . Emesis    (Consider location/radiation/quality/duration/timing/severity/associated sxs/prior treatment) HPI Comments: Patient brought by family for evaluation of progressive weakness, falling for the past three weeks.  It became worse today and she has apparently fallen three times.  She seemed more confused this evening and vomited once.  Family reports a right sided facial droop, unsteady gait, and difficulty speaking that was noticed prior to coming here.  She was last seen without this at 20:30.  Patient is a 79 y.o. female presenting with extremity weakness. The history is provided by the patient.  Extremity Weakness This is a new problem. Episode onset: 3 weeks ago. The problem occurs constantly. The problem has been gradually worsening. Pertinent negatives include no chest pain and no shortness of breath. Nothing aggravates the symptoms. Nothing relieves the symptoms. She has tried nothing for the symptoms. The treatment provided no relief.    History reviewed. No pertinent past medical history.  Past Surgical History  Procedure Laterality Date  . Knee arthroscopy    . Cholecystectomy    . Abdominal hysterectomy      No family history on file.  History  Substance Use Topics  . Smoking status: Never Smoker   . Smokeless tobacco: Not on file  . Alcohol Use: No    OB History   Grav Para Term Preterm Abortions TAB SAB Ect Mult Living                  Review of Systems  Respiratory: Negative for shortness of breath.   Cardiovascular: Negative for chest pain.  Musculoskeletal: Positive for extremity weakness.  All other systems reviewed and are negative.    Allergies  Review of patient's allergies indicates no known allergies.  Home Medications  No  current outpatient prescriptions on file.  BP 161/90  Pulse 125  Temp(Src) 98.7 F (37.1 C) (Oral)  Resp 29  Ht 5\' 1"  (1.549 m)  Wt 130 lb (58.968 kg)  BMI 24.58 kg/m2  SpO2 95%  Physical Exam  Nursing note and vitals reviewed. Constitutional: She is oriented to person, place, and time.  Elderly female, no acute distress.    HENT:  Head: Normocephalic and atraumatic.  Mouth/Throat: Oropharynx is clear and moist.  Eyes: EOM are normal. Pupils are equal, round, and reactive to light.  Neck: Normal range of motion. Neck supple.  Cardiovascular: Normal rate and regular rhythm.   No murmur heard. Pulmonary/Chest: Effort normal and breath sounds normal. No respiratory distress. She has no wheezes.  Abdominal: Soft. Bowel sounds are normal. She exhibits no distension. There is no tenderness.  Musculoskeletal: Normal range of motion. She exhibits no edema.  Lymphadenopathy:    She has no cervical adenopathy.  Neurological: She is alert and oriented to person, place, and time.  There is a right-sided facial droop noted.  There is weakness in the right hand grip.  She is slow to respond to questions and appears to have a fine tremor noted with rest.  Skin: Skin is warm and dry.    ED Course  Procedures (including critical care time)  Labs Reviewed  CBC WITH DIFFERENTIAL - Abnormal; Notable for the following:    WBC 11.8 (*)    Neutrophils Relative % 91 (*)  Neutro Abs 10.7 (*)    Lymphocytes Relative 8 (*)    Monocytes Relative 1 (*)    All other components within normal limits  POCT I-STAT, CHEM 8 - Abnormal; Notable for the following:    Glucose, Bld 189 (*)    All other components within normal limits  TROPONIN I  URINALYSIS, ROUTINE W REFLEX MICROSCOPIC   No results found.   No diagnosis found.   Date: 03/26/2013  Rate: 136  Rhythm: sinus tachycardia  QRS Axis: normal  Intervals: normal  ST/T Wave abnormalities: normal  Conduction Disutrbances:none  Narrative  Interpretation:   Old EKG Reviewed: none available    MDM  The workup unfortunately reveals a large frontal meningioma with surrounding edema and midline shift.  I have spoken with Dr. Yetta Barre from neurosurgery who recommends decadron and admission to medicine.  Decadron and mri ordered.  Dr. Toniann Fail to see and admit patient.        Geoffery Lyons, MD 03/26/13 [phone]

## 2013-03-26 NOTE — ED Notes (Signed)
Pt. Woke up n/v.  Pt. Took xanax ~ MN, pain pill 2000, and ibuprofen 30 minutes ago. Pt. Falling frequently x 2 weeks ago. Inc. Weakness on rt. Side.  Received cortisone shot yesterday. Inc. Confusion. Rt. Facial droop, slight rt. Tongue deviation, weak rt. Grip, unsteady gait, dysarthric speech;

## 2013-03-26 NOTE — Progress Notes (Signed)
Addressed BP 170/101 HR 120 with Dr. Pete Glatter and Dr. Danielle Dess.  Awaiting orders for hypertension and ICU transfer.  Patient resting comfortably at this time.  Meghan Yu 9:12 PM 03/26/2013

## 2013-03-26 NOTE — Consult Note (Signed)
Reason for Consult: Brain tumor, meningioma left frontal Referring Physician: Leanne Sisler is an 79 y.o. female.  HPI: Patient is 79 year old lady who's had 2 weeks of progressive weakness in the right upper extremity with facial weakness when her daughter noted that she thought she had a stroke the weakness continued it worse to the point that she couldn't walk she also had apraxia and right upper extremity which is brought into the emergency department or CT and MRI demonstrated the presence of is large frontal tumor that appears en plaque to the dura and is consistent with meningioma there is significant shift and mass effect and significant edema in the left peritumoral region.  Past Medical History  Diagnosis Date  . Hypothyroidism   . Hyperlipidemia     Past Surgical History  Procedure Laterality Date  . Knee arthroscopy    . Cholecystectomy    . Abdominal hysterectomy      Family History  Problem Relation Age of Onset  . Stroke Mother     Social History:  reports that she has never smoked. She does not have any smokeless tobacco history on file. She reports that she does not drink alcohol or use illicit drugs.  Allergies: No Known Allergies  Medications: I have reviewed the patient's current medications.  Results for orders placed during the hospital encounter of 03/26/13 (from the past 48 hour(s))  CBC WITH DIFFERENTIAL     Status: Abnormal   Collection Time    03/26/13  3:20 AM      Result Value Range   WBC 11.8 (*) 4.0 - 10.5 K/uL   RBC 4.65  3.87 - 5.11 MIL/uL   Hemoglobin 14.0  12.0 - 15.0 g/dL   HCT 16.1  09.6 - 04.5 %   MCV 86.0  78.0 - 100.0 fL   MCH 30.1  26.0 - 34.0 pg   MCHC 35.0  30.0 - 36.0 g/dL   RDW 40.9  81.1 - 91.4 %   Platelets 218  150 - 400 K/uL   Neutrophils Relative % 91 (*) 43 - 77 %   Neutro Abs 10.7 (*) 1.7 - 7.7 K/uL   Lymphocytes Relative 8 (*) 12 - 46 %   Lymphs Abs 1.0  0.7 - 4.0 K/uL   Monocytes Relative 1 (*) 3 - 12 %    Monocytes Absolute 0.1  0.1 - 1.0 K/uL   Eosinophils Relative 0  0 - 5 %   Eosinophils Absolute 0.0  0.0 - 0.7 K/uL   Basophils Relative 0  0 - 1 %   Basophils Absolute 0.0  0.0 - 0.1 K/uL  POCT I-STAT, CHEM 8     Status: Abnormal   Collection Time    03/26/13  3:27 AM      Result Value Range   Sodium 138  135 - 145 mEq/L   Potassium 4.0  3.5 - 5.1 mEq/L   Chloride 100  96 - 112 mEq/L   BUN 18  6 - 23 mg/dL   Creatinine, Ser 7.82  0.50 - 1.10 mg/dL   Glucose, Bld 956 (*) 70 - 99 mg/dL   Calcium, Ion 2.13  0.86 - 1.30 mmol/L   TCO2 29  0 - 100 mmol/L   Hemoglobin 14.6  12.0 - 15.0 g/dL   HCT 57.8  46.9 - 62.9 %  TROPONIN I     Status: None   Collection Time    03/26/13  3:40 AM      Result  Value Range   Troponin I <0.30  <0.30 ng/mL   Comment:            Due to the release kinetics of cTnI,     a negative result within the first hours     of the onset of symptoms does not rule out     myocardial infarction with certainty.     If myocardial infarction is still suspected,     repeat the test at appropriate intervals.  URINALYSIS, ROUTINE W REFLEX MICROSCOPIC     Status: Abnormal   Collection Time    03/26/13  5:03 AM      Result Value Range   Color, Urine YELLOW  YELLOW   APPearance CLEAR  CLEAR   Specific Gravity, Urine 1.016  1.005 - 1.030   pH 7.5  5.0 - 8.0   Glucose, UA 100 (*) NEGATIVE mg/dL   Hgb urine dipstick MODERATE (*) NEGATIVE   Bilirubin Urine NEGATIVE  NEGATIVE   Ketones, ur 15 (*) NEGATIVE mg/dL   Protein, ur >161 (*) NEGATIVE mg/dL   Urobilinogen, UA 0.2  0.0 - 1.0 mg/dL   Nitrite NEGATIVE  NEGATIVE   Leukocytes, UA NEGATIVE  NEGATIVE  URINE MICROSCOPIC-ADD ON     Status: Abnormal   Collection Time    03/26/13  5:03 AM      Result Value Range   Squamous Epithelial / LPF RARE  RARE   WBC, UA 0-2  <3 WBC/hpf   RBC / HPF 11-20  <3 RBC/hpf   Bacteria, UA RARE  RARE   Casts HYALINE CASTS (*) NEGATIVE   Urine-Other MUCOUS PRESENT      Dg Chest 2  View  03/26/2013   *RADIOLOGY REPORT*  Clinical Data: Nausea and vomiting.  Confusion and right-sided facial droop.  CHEST - 2 VIEW  Comparison: None.  Findings: The lungs are mildly hypoexpanded; minimal left-sided atelectasis is seen.  No definite pleural effusion or pneumothorax is seen.  The heart is normal in size; the mediastinal contour is within normal limits.  No acute osseous abnormalities are seen.  Clips are noted within the right upper quadrant, reflecting prior cholecystectomy.  IMPRESSION: Lungs mildly hypoexpanded, with minimal left-sided atelectasis.   Original Report Authenticated By: Tonia Ghent, M.D.   Ct Head Wo Contrast  03/26/2013   *RADIOLOGY REPORT*  Clinical Data: Nausea and vomiting.  History of recent falls for 2 weeks.  Right-sided weakness, confusion and right-sided facial droop.  CT HEAD WITHOUT CONTRAST  Technique:  Contiguous axial images were obtained from the base of the skull through the vertex without contrast.  Comparison: None.  Findings:   There is a large mildly hyperdense and slightly heterogeneous mass noted at the high left frontoparietal region, measuring approximately 5.0 x 4.4 cm, abutting the left anterior falx cerebri.  Marked surrounding vasogenic edema is noted, extending inferiorly along the left frontal lobe.  The appearance is suspicious for a large meningioma, though MRI would be helpful for further evaluation.  1.3 cm of rightward midline shift is noted, with likely subfalcine herniation, and there is mass effect on the frontal horn of the left lateral ventricle.  No definite hemorrhagic transformation is seen.  Small chronic lacunar infarcts are seen within the left basal ganglia.  The posterior fossa, including the cerebellum, brainstem and fourth ventricle, is within normal limits.  There is no evidence of hydrocephalus.  There is no evidence of fracture; visualized osseous structures are unremarkable in appearance.  The orbits are  within normal limits.  The paranasal sinuses and mastoid air cells are well-aerated.  No significant soft tissue abnormalities are seen.  IMPRESSION:  1.  Large 5.0 x 4.4 cm mildly hyperdense mass at the high left frontoparietal region, abutting the left anterior falx cerebri. Marked surrounding vasogenic edema noted.  This is suspicious for a large meningioma, though MRI is recommended for further evaluation.  1.3 cm of rightward midline shift noted, with likely subfalcine herniation, and mass effect on the frontal horn of the left lateral ventricle.  No definite hemorrhagic transformation seen.  2.  Small chronic lacunar infarcts within the left basal ganglia.  These results were called by telephone on 03/26/2013 at 04:59 a.m. to Dr. Geoffery Lyons, who verbally acknowledged these results.   Original Report Authenticated By: Tonia Ghent, M.D.   Mr [name] Wo Contrast  03/26/2013   *RADIOLOGY REPORT*  Clinical Data: Right-sided weakness for 1 day.  MRI HEAD WITHOUT AND WITH CONTRAST  Technique:  Multiplanar, multiecho pulse sequences of the brain and surrounding structures were obtained according to standard protocol without and with intravenous contrast  Contrast: 13mL MULTIHANCE GADOBENATE DIMEGLUMINE 529 MG/ML IV SOLN  Comparison: CT head earlier in the day.  Findings: There is a left hemispheric frontal and posterior frontal extra-axial mass based on the falx and parasagittal convexity. Right-left dimension as measured on coronal imaging was 52 mm, anterior posterior dimension as measured on sagittal imaging is 56 mm, and craniocaudad imaging as measured on sagittal imaging is 35 mm.  The mass invades the superior sagittal sinus at the junction of the anterior and middle thirds of that structure, but does not occlude the sinus (image 50 series 11, image 15 series 12). There is no calvarial invasion. A small component of the tumor extends through the falx projecting into the right  posterior frontal parasagittal region.  A smaller  tumor which is adjacent to the large mass lies along the left frontal convexity, measuring 11 x 23 mm on image 44 series 11.  An additional small incidental meningioma, subcentimeter in size, projects inward from the right temporal squama and without significant mass effect or associated edema (image 22 series 11).  There is moderate associated left frontal and posterior frontal white matter vasogenic edema with left to right shift of approximately 9 mm.  The homogeneous enhancement, dural tail, slight restricted diffusion, and extra- axial location all favor the diagnosis of meningioma.  Moderate atrophy is present.  There is no significant white matter disease.  There is a remote slit-like lacunar infarct affecting the left putamen.  Mildly prominent perivascular spaces are noted. Negative orbits.  No sinus disease.  Pituitary and upper cervical region unremarkable.  Flow voids are maintained in the carotid and basilar arteries.  No acute mastoid disease is present.  IMPRESSION: Findings consistent with a left hemispheric frontal and posterior frontal extra-axial mass, likely meningioma.  There is invasion of the superior sagittal sinus without occlusion. Moderate associated edema in the left frontal and posterior frontal white matter with left to right shift of approximately 1 cm. See discussion above.   Original Report Authenticated By: Davonna Belling, M.D.    Review of Systems  HENT: Negative.   Eyes: Negative.   Respiratory: Negative.   Cardiovascular: Negative.   Gastrointestinal: Negative.   Genitourinary: Negative.   Musculoskeletal: Negative.   Skin: Negative.   Neurological: Positive for weakness.       Patient with right lower extremity greater than right upper extremity weakness confusion  lethargy facial weakness also noted for past 2 weeks  Endo/Heme/Allergies: Negative.   Psychiatric/Behavioral: Negative.    Blood pressure 182/85, pulse 117, temperature 98.5 F (36.9 C), temperature  source Oral, resp. rate 20, height 5\' 1"  (1.549 m), weight 60.782 kg (134 lb), SpO2 95.00%. Physical Exam  Constitutional: She appears well-nourished.  HENT:  Head: Normocephalic and atraumatic.  Eyes: Conjunctivae and EOM are normal. Pupils are equal, round, and reactive to light.  Neck: Normal range of motion. Neck supple.  Cardiovascular: Regular rhythm.   Respiratory: Breath sounds normal.  GI: Soft. Bowel sounds are normal.  Musculoskeletal: Normal range of motion.  Neurological:  Luz Brazen right facial weakness right upper extremity weakness positive drift right lower extremity weakness 3/5 marked apraxia on the right.  Skin: Skin is warm.  Psychiatric:  Depressed affect    Assessment/Plan: Patient has a large left frontal meningioma with significant shift and mass effect.  I discussed briefly with the patient's daughter that she ultimately will require some surgical resection of this lesion concern is a dislocation near the motor strip and potential risks were significant weakness in that right side that may be further propagated. Nonetheless because of the degree of mass effect she will ultimately require surgical resection of this lesion. We'll follow along and schedule. We'll see if the acute administration of Decadron will help her.  Javarion Douty J 03/26/2013, 10:46 AM

## 2013-03-27 DIAGNOSIS — E039 Hypothyroidism, unspecified: Secondary | ICD-10-CM

## 2013-03-27 DIAGNOSIS — E785 Hyperlipidemia, unspecified: Secondary | ICD-10-CM

## 2013-03-27 DIAGNOSIS — G939 Disorder of brain, unspecified: Secondary | ICD-10-CM

## 2013-03-27 LAB — CBC WITH DIFFERENTIAL/PLATELET
Basophils Absolute: 0 10*3/uL (ref 0.0–0.1)
Basophils Relative: 0 % (ref 0–1)
Eosinophils Absolute: 0 10*3/uL (ref 0.0–0.7)
MCH: 29.8 pg (ref 26.0–34.0)
MCHC: 34.5 g/dL (ref 30.0–36.0)
Monocytes Relative: 2 % — ABNORMAL LOW (ref 3–12)
Neutrophils Relative %: 91 % — ABNORMAL HIGH (ref 43–77)
Platelets: 203 10*3/uL (ref 150–400)
RDW: 13.5 % (ref 11.5–15.5)

## 2013-03-27 LAB — MRSA PCR SCREENING: MRSA by PCR: NEGATIVE

## 2013-03-27 LAB — BASIC METABOLIC PANEL
BUN: 18 mg/dL (ref 6–23)
GFR calc Af Amer: >90 mL/min (ref >90)
GFR calc non Af Amer: 79 mL/min — ABNORMAL LOW (ref >90)
Potassium: 3.9 mEq/L (ref 3.5–5.1)
Sodium: 136 mEq/L (ref 135–145)

## 2013-03-27 MED ORDER — INSULIN ASPART 100 UNIT/ML ~~LOC~~ SOLN
0.0000 [IU] | Freq: Three times a day (TID) | SUBCUTANEOUS | Status: DC
Start: 2013-03-27 — End: 2013-04-01
  Administered 2013-03-27 – 2013-03-28 (×3): 2 [IU] via SUBCUTANEOUS
  Administered 2013-03-28: 3 [IU] via SUBCUTANEOUS
  Administered 2013-03-28 – 2013-03-31 (×6): 2 [IU] via SUBCUTANEOUS
  Administered 2013-03-31 (×2): 1 [IU] via SUBCUTANEOUS
  Administered 2013-04-01: 2 [IU] via SUBCUTANEOUS
  Administered 2013-04-01: 1 [IU] via SUBCUTANEOUS

## 2013-03-27 MED ORDER — BIOTENE DRY MOUTH MT LIQD
15.0000 mL | Freq: Two times a day (BID) | OROMUCOSAL | Status: DC
  Administered 2013-03-27 – 2013-03-31 (×6): 15 mL via OROMUCOSAL

## 2013-03-27 NOTE — Progress Notes (Signed)
PULMONARY  / CRITICAL CARE MEDICINE  Name: Meghan Yu MRN: [id number] DOB: [birth year]    ADMISSION DATE:  03/26/2013 CONSULTATION DATE:  03/26/13 / Meghan Carbon MD :  Meghan Yu PRIMARY SERVICE: Neurosurgery  CHIEF COMPLAINT:  Right hemiparesis and lethargy   BRIEF PATIENT DESCRIPTION:  79 years old female with PMH relevant for hypothyroidism and dyslipidemia. Admitted with right hemiparesis and lethargy. Brain imaging showed a large left frontotemporal mass with vasogenic edema and midline shift.  SIGNIFICANT EVENTS / STUDIES:  MRI of the brain Findings consistent with a left hemispheric frontal and posterior  frontal extra-axial mass, likely meningioma. There is invasion of  the superior sagittal sinus without occlusion. Moderate associated  edema in the left frontal and posterior frontal white matter with  left to right shift of approximately 1 cm.   LINES / TUBES: Peripheral IV's  SUBJECTIVE:  Denies chest congestion.  VITAL SIGNS: Temp:  [98.2 F (36.8 C)-99.8 F (37.7 C)] 98.2 F (36.8 C) (06/07 0800) Pulse Rate:  [75-126] 75 (06/07 1000) Resp:  [16-26] 17 (06/07 1000) BP: (93-179)/(53-105) 130/53 mmHg (06/07 1000) SpO2:  [92 %-99 %] 98 % (06/07 1000) 2 liters [place]  INTAKE / OUTPUT: Intake/Output     06/06 0701 - 06/07 0700 06/07 0701 - 06/08 0700   P.O. 360    I.V. (mL/kg) 960 (15.8) 150 (2.5)   IV Piggyback 155 50   Total Intake(mL/kg) 1475 (24.3) 200 (3.3)   Urine (mL/kg/hr) 1270 (0.9) 175 (0.8)   Total Output 1270 175   Net +205 +25          PHYSICAL EXAMINATION: General: No distress HEENT: no oral lesions Cardiac: regular Chest: decreased breath sounds, no wheeze, weak cough Abd: soft, non tender Ext: no edema Skin: no rashes Neuro: Awake, follows commands, Rt facial drop, speech appropriate  LABS:  Recent Labs Lab 03/26/13 0320  03/26/13 0327 03/26/13 0340 03/26/13 1052 03/27/13 0545  HGB 14.0  --  14.6  --   --  13.6  WBC 11.8*  --    --   --   --  11.4*  PLT 218  --   --   --   --  203  NA  --   --  138  --  136 136  K  --   < > 4.0  --  3.5 3.9  CL  --   --  100  --  95* 97  CO2  --   --   --   --  26 26  GLUCOSE  --   --  189*  --  202* 202*  BUN  --   --  18  --  15 18  CREATININE  --   --  0.70  --  0.64 0.74  CALCIUM  --   --   --   --  9.8 9.3  AST  --   --   --   --  28  --   ALT  --   --   --   --  26  --   ALKPHOS  --   --   --   --  64  --   BILITOT  --   --   --   --  0.6  --   PROT  --   --   --   --  8.4*  --   ALBUMIN  --   --   --   --  4.6  --  TROPONINI  --   --   --  <0.30  --   --   < > = values in this interval not displayed.  CBG (last 3)  No results found for this basename: GLUCAP,  in the last 72 hours   Imaging: Dg Chest 2 View  03/26/2013   *RADIOLOGY REPORT*  Clinical Data: Nausea and vomiting.  Confusion and right-sided facial droop.  CHEST - 2 VIEW  Comparison: None.  Findings: The lungs are mildly hypoexpanded; minimal left-sided atelectasis is seen.  No definite pleural effusion or pneumothorax is seen.  The heart is normal in size; the mediastinal contour is within normal limits.  No acute osseous abnormalities are seen.  Clips are noted within the right upper quadrant, reflecting prior cholecystectomy.  IMPRESSION: Lungs mildly hypoexpanded, with minimal left-sided atelectasis.   Original Report Authenticated By: Meghan Yu, M.D.   Ct Head Wo Contrast  03/26/2013   *RADIOLOGY REPORT*  Clinical Data: Nausea and vomiting.  History of recent falls for 2 weeks.  Right-sided weakness, confusion and right-sided facial droop.  CT HEAD WITHOUT CONTRAST  Technique:  Contiguous axial images were obtained from the base of the skull through the vertex without contrast.  Comparison: None.  Findings:   There is a large mildly hyperdense and slightly heterogeneous mass noted at the high left frontoparietal region, measuring approximately 5.0 x 4.4 cm, abutting the left anterior falx cerebri.  Marked  surrounding vasogenic edema is noted, extending inferiorly along the left frontal lobe.  The appearance is suspicious for a large meningioma, though MRI would be helpful for further evaluation.  1.3 cm of rightward midline shift is noted, with likely subfalcine herniation, and there is mass effect on the frontal horn of the left lateral ventricle.  No definite hemorrhagic transformation is seen.  Small chronic lacunar infarcts are seen within the left basal ganglia.  The posterior fossa, including the cerebellum, brainstem and fourth ventricle, is within normal limits.  There is no evidence of hydrocephalus.  There is no evidence of fracture; visualized osseous structures are unremarkable in appearance.  The orbits are within normal limits. The paranasal sinuses and mastoid air cells are well-aerated.  No significant soft tissue abnormalities are seen.  IMPRESSION:  1.  Large 5.0 x 4.4 cm mildly hyperdense mass at the high left frontoparietal region, abutting the left anterior falx cerebri. Marked surrounding vasogenic edema noted.  This is suspicious for a large meningioma, though MRI is recommended for further evaluation.  1.3 cm of rightward midline shift noted, with likely subfalcine herniation, and mass effect on the frontal horn of the left lateral ventricle.  No definite hemorrhagic transformation seen.  2.  Small chronic lacunar infarcts within the left basal ganglia.  These results were called by telephone on 03/26/2013 at 04:59 a.m. to Dr. Geoffery Yu, who verbally acknowledged these results.   Original Report Authenticated By: Meghan Yu, M.D.   Mr Meghan Yu Wo Contrast  03/26/2013   *RADIOLOGY REPORT*  Clinical Data: Right-sided weakness for 1 day.  MRI HEAD WITHOUT AND WITH CONTRAST  Technique:  Multiplanar, multiecho pulse sequences of the brain and surrounding structures were obtained according to standard protocol without and with intravenous contrast  Contrast: 13mL MULTIHANCE GADOBENATE  DIMEGLUMINE 529 MG/ML IV SOLN  Comparison: CT head earlier in the day.  Findings: There is a left hemispheric frontal and posterior frontal extra-axial mass based on the falx and parasagittal convexity. Right-left dimension as measured on coronal imaging was 52 mm,  anterior posterior dimension as measured on sagittal imaging is 56 mm, and craniocaudad imaging as measured on sagittal imaging is 35 mm.  The mass invades the superior sagittal sinus at the junction of the anterior and middle thirds of that structure, but does not occlude the sinus (image 50 series 11, image 15 series 12). There is no calvarial invasion. A small component of the tumor extends through the falx projecting into the right  posterior frontal parasagittal region.  A smaller tumor which is adjacent to the large mass lies along the left frontal convexity, measuring 11 x 23 mm on image 44 series 11.  An additional small incidental meningioma, subcentimeter in size, projects inward from the right temporal squama and without significant mass effect or associated edema (image 22 series 11).  There is moderate associated left frontal and posterior frontal white matter vasogenic edema with left to right shift of approximately 9 mm.  The homogeneous enhancement, dural tail, slight restricted diffusion, and extra- axial location all favor the diagnosis of meningioma.  Moderate atrophy is present.  There is no significant white matter disease.  There is a remote slit-like lacunar infarct affecting the left putamen.  Mildly prominent perivascular spaces are noted. Negative orbits.  No sinus disease.  Pituitary and upper cervical region unremarkable.  Flow voids are maintained in the carotid and basilar arteries.  No acute mastoid disease is present.  IMPRESSION: Findings consistent with a left hemispheric frontal and posterior frontal extra-axial mass, likely meningioma.  There is invasion of the superior sagittal sinus without occlusion. Moderate  associated edema in the left frontal and posterior frontal white matter with left to right shift of approximately 1 cm. See discussion above.   Original Report Authenticated By: Davonna Belling, M.D.     ASSESSMENT / PLAN: NEUROLOGIC A:  Right frontal mass with vasogenic edema and midline shift P:   - Continue decadron per neurosurgery - Plan for surgery later this week - Keppra for seizure prevention  PULMONARY A: Weak cough with concern for airway protection >> respiratory status adequate. P:   - Close monitoring in the ICU for inability to protect her airway.  CARDIOVASCULAR A: Elevated blood pressure. Hx of hyperlipidemia. P:  - Treat if SBP > 180 - continue simvastatin  RENAL A:  No issues. P:   - Will follow BMP  GASTROINTESTINAL A: Nutrition. P:   - change to CHO mod diet while on decadron - pepcid for SUP  HEMATOLOGIC A:  No issues P:  - Follow CBC - SCD for DVT prevention  INFECTIOUS A: Concern for aspiration potential. P: - monitor clinically  ENDOCRINE A: Hx of hypothyroidism. Steroid induced hyperglycemia. P:   - Continue synthroid - SSI while on decadron  PCCM will f/u again on 6/09 >> call if help needed sooner.  Coralyn Helling, MD [facility] Pulmonary/Critical Care 03/27/2013, 10:56 AM Pager:  [phone] After 3pm call: [phone]

## 2013-03-27 NOTE — Progress Notes (Signed)
Patient ID: Meghan Yu, female   DOB: [birth year], 79 y.o.   MRN: [id number] BP 93/65  Pulse 113  Temp(Src) 98.2 F (36.8 C) (Oral)  Resp 22  Ht 5\' 1"  (1.549 m)  Wt 60.782 kg (134 lb)  BMI 25.33 kg/m2  SpO2 96% Alert and oriented x 4, dysarthric, fluent Perrl, full eom Right facial droop +shoulder shrug Right sided plegia For OR next week.  BP is much better.

## 2013-03-27 NOTE — Procedures (Signed)
[name] notified due to concern with two decadron orders.  Told to give 12 mg of Decadron IV. Other duplicate order discontinued by M.D.  See MAR. Patient stable with family at bedside.  Will continue to monitor. Meghan Yu

## 2013-03-27 NOTE — Progress Notes (Signed)
Gave telephonic report to [place], RN on unit 3100.  No questions or concerns at this time.  Transferred to room 3111.  Patient A&O x4.  Vitals stable.  Tele: sinus tachycardia.  Patient denies pain.  Barrie Lyme RN II 03/26/13 2240

## 2013-03-27 NOTE — Progress Notes (Signed)
Subjective: She is alert does c/o headache  Objective: Vital signs in last 24 hours: Temp:  [98.5 F (36.9 C)-99.8 F (37.7 C)] 98.7 F (37.1 C) (06/07 0428) Pulse Rate:  [79-126] 126 (06/07 0700) Resp:  [16-26] 20 (06/07 0700) BP: (127-182)/(55-105) 160/64 mmHg (06/07 0603) SpO2:  [92 %-99 %] 96 % (06/07 0700) Weight:  [60.782 kg (134 lb)] 60.782 kg (134 lb) (06/06 0835) Weight change: 1.814 kg (4 lb)    Intake/Output from previous day: 06/06 0701 - 06/07 0700 In: 1425 [P.O.:360; I.V.:910; IV Piggyback:155] Out: 1270 [Urine:1270] Intake/Output this shift:    Resp: clear to auscultation bilaterally Cardio: regularly irregular rhythm Neurologic: Mental status: Alert, oriented, thought content appropriate, alertness: lethargic, speech garbled, incomplete answers to questions Motor: grip strength right 3/5 and 4/5 left mild right facial droop  Lab Results:  Recent Labs  03/26/13 0320 03/26/13 0327 03/27/13 0545  WBC 11.8*  --  11.4*  HGB 14.0 14.6 13.6  HCT 40.0 43.0 39.4  PLT 218  --  203   BMET  Recent Labs  03/26/13 1052 03/27/13 0545  NA 136 136  K 3.5 3.9  CL 95* 97  CO2 26 26  GLUCOSE 202* 202*  BUN 15 18  CREATININE 0.64 0.74  CALCIUM 9.8 9.3    Studies/Results: Dg Chest 2 View  03/26/2013   *RADIOLOGY REPORT*  Clinical Data: Nausea and vomiting.  Confusion and right-sided facial droop.  CHEST - 2 VIEW  Comparison: None.  Findings: The lungs are mildly hypoexpanded; minimal left-sided atelectasis is seen.  No definite pleural effusion or pneumothorax is seen.  The heart is normal in size; the mediastinal contour is within normal limits.  No acute osseous abnormalities are seen.  Clips are noted within the right upper quadrant, reflecting prior cholecystectomy.  IMPRESSION: Lungs mildly hypoexpanded, with minimal left-sided atelectasis.   Original Report Authenticated By: Tonia Ghent, M.D.   Ct Head Wo Contrast  03/26/2013   *RADIOLOGY REPORT*   Clinical Data: Nausea and vomiting.  History of recent falls for 2 weeks.  Right-sided weakness, confusion and right-sided facial droop.  CT HEAD WITHOUT CONTRAST  Technique:  Contiguous axial images were obtained from the base of the skull through the vertex without contrast.  Comparison: None.  Findings:   There is a large mildly hyperdense and slightly heterogeneous mass noted at the high left frontoparietal region, measuring approximately 5.0 x 4.4 cm, abutting the left anterior falx cerebri.  Marked surrounding vasogenic edema is noted, extending inferiorly along the left frontal lobe.  The appearance is suspicious for a large meningioma, though MRI would be helpful for further evaluation.  1.3 cm of rightward midline shift is noted, with likely subfalcine herniation, and there is mass effect on the frontal horn of the left lateral ventricle.  No definite hemorrhagic transformation is seen.  Small chronic lacunar infarcts are seen within the left basal ganglia.  The posterior fossa, including the cerebellum, brainstem and fourth ventricle, is within normal limits.  There is no evidence of hydrocephalus.  There is no evidence of fracture; visualized osseous structures are unremarkable in appearance.  The orbits are within normal limits. The paranasal sinuses and mastoid air cells are well-aerated.  No significant soft tissue abnormalities are seen.  IMPRESSION:  1.  Large 5.0 x 4.4 cm mildly hyperdense mass at the high left frontoparietal region, abutting the left anterior falx cerebri. Marked surrounding vasogenic edema noted.  This is suspicious for a large meningioma, though MRI is recommended  for further evaluation.  1.3 cm of rightward midline shift noted, with likely subfalcine herniation, and mass effect on the frontal horn of the left lateral ventricle.  No definite hemorrhagic transformation seen.  2.  Small chronic lacunar infarcts within the left basal ganglia.  These results were called by telephone  on 03/26/2013 at 04:59 a.m. to Dr. Geoffery Lyons, who verbally acknowledged these results.   Original Report Authenticated By: Tonia Ghent, M.D.   Mr [name] Wo Contrast  03/26/2013   *RADIOLOGY REPORT*  Clinical Data: Right-sided weakness for 1 day.  MRI HEAD WITHOUT AND WITH CONTRAST  Technique:  Multiplanar, multiecho pulse sequences of the brain and surrounding structures were obtained according to standard protocol without and with intravenous contrast  Contrast: 13mL MULTIHANCE GADOBENATE DIMEGLUMINE 529 MG/ML IV SOLN  Comparison: CT head earlier in the day.  Findings: There is a left hemispheric frontal and posterior frontal extra-axial mass based on the falx and parasagittal convexity. Right-left dimension as measured on coronal imaging was 52 mm, anterior posterior dimension as measured on sagittal imaging is 56 mm, and craniocaudad imaging as measured on sagittal imaging is 35 mm.  The mass invades the superior sagittal sinus at the junction of the anterior and middle thirds of that structure, but does not occlude the sinus (image 50 series 11, image 15 series 12). There is no calvarial invasion. A small component of the tumor extends through the falx projecting into the right  posterior frontal parasagittal region.  A smaller tumor which is adjacent to the large mass lies along the left frontal convexity, measuring 11 x 23 mm on image 44 series 11.  An additional small incidental meningioma, subcentimeter in size, projects inward from the right temporal squama and without significant mass effect or associated edema (image 22 series 11).  There is moderate associated left frontal and posterior frontal white matter vasogenic edema with left to right shift of approximately 9 mm.  The homogeneous enhancement, dural tail, slight restricted diffusion, and extra- axial location all favor the diagnosis of meningioma.  Moderate atrophy is present.  There is no significant white matter disease.  There is a  remote slit-like lacunar infarct affecting the left putamen.  Mildly prominent perivascular spaces are noted. Negative orbits.  No sinus disease.  Pituitary and upper cervical region unremarkable.  Flow voids are maintained in the carotid and basilar arteries.  No acute mastoid disease is present.  IMPRESSION: Findings consistent with a left hemispheric frontal and posterior frontal extra-axial mass, likely meningioma.  There is invasion of the superior sagittal sinus without occlusion. Moderate associated edema in the left frontal and posterior frontal white matter with left to right shift of approximately 1 cm. See discussion above.   Original Report Authenticated By: Davonna Belling, M.D.    Medications:  Scheduled: . antiseptic oral rinse  15 mL Mouth Rinse BID  . dexamethasone  12 mg Intravenous Q6H  . famotidine (PEPCID) IV  20 mg Intravenous Q12H  . levETIRAcetam  500 mg Intravenous Q12H  . levothyroxine  75 mcg Oral QAC breakfast  . magnesium chloride  1 tablet Oral Daily  . pantoprazole  40 mg Oral Daily  . simvastatin  20 mg Oral QPM  . sodium chloride  3 mL Intravenous Q12H    Assessment/Plan: 1. mass left brain, appreciate neurosurgical care likely for surgery next week 2. Elevated bp coming down 140/106 without medication likely due to steroids and cns tumor appreciate critical care evaluation 3. Tachycardia, appears  euvolemic will continue to observe check lab tomorrow  LOS: 1 day   [facility] 03/27/2013, 8:08 AM

## 2013-03-27 NOTE — Evaluation (Addendum)
Clinical/Bedside Swallow Evaluation Patient Details  Name: Meghan Yu MRN: [id number] Date of Birth: 07-06-1932  Today's Date: 03/27/2013 Time: 1000-1030 SLP Time Calculation (min): 30 min  Past Medical History:  Past Medical History  Diagnosis Date  . Hypothyroidism   . Hyperlipidemia    Past Surgical History:  Past Surgical History  Procedure Laterality Date  . Knee arthroscopy    . Cholecystectomy    . Abdominal hysterectomy     HPI:  79 years old female with PMH relevant for hypothyroidism and dyslipidemia. Admitted with right hemiparesis and lethargy. Brain imaging showed a large left frontotemporal mass with vasogenic edema and midline shift. BSE ordered per Stroke Protocol.    Assessment / Plan / Recommendation Clinical Impression  Min oral dysphagia due to right sided lingual and labial weakness.  Pharyngeal swallow functional with no outward s/s of aspiration.  Initially, mastication slow, deliberate, and ineffective but improved as LOA improved at end of evaluation.  Continue current diet consistency with full supervision due to present cognitive status to ensure safety.  ST to follow in acute care setting for diet tolerance and modify diet as warranted.     Aspiration Risk  Mild    Diet Recommendation Regular;Thin liquid   Liquid Administration via: Cup;Straw Medication Administration: Whole meds with liquid Supervision: Patient able to self feed;Full supervision/cueing for compensatory strategies Compensations: Slow rate;Small sips/bites;Check for pocketing Postural Changes and/or Swallow Maneuvers: Seated upright 90 degrees;Upright 30-60 min after meal    Other  Recommendations Oral Care Recommendations: Oral care QID   Follow Up Recommendations   (TBD)    Frequency and Duration min 2x/week  2 weeks       SLP Swallow Goals Patient will utilize recommended strategies during swallow to increase swallowing safety with: Minimal assistance   Swallow  Study Prior Functional Status   Lived at home     General Date of Onset: 03/26/13 HPI: 79 years old female with PMH relevant for hypothyroidism and dyslipidemia. Admitted with right hemiparesis and lethargy. Brain imaging showed a large left frontotemporal mass with vasogenic edema and midline shift. Type of Study: Bedside swallow evaluation Diet Prior to this Study: Regular;Thin liquids Temperature Spikes Noted: No Respiratory Status: Supplemental O2 delivered via (comment) (nasal cannula) History of Recent Intubation: No Behavior/Cognition: Confused;Lethargic;Distractible;Requires cueing Oral Cavity - Dentition: Dentures, top;Dentures, bottom Self-Feeding Abilities: Able to feed self;Needs set up;Needs assist Patient Positioning: Upright in bed Baseline Vocal Quality: Clear Volitional Cough: Cognitively unable to elicit Volitional Swallow: Unable to elicit    Oral/Motor/Sensory Function Overall Oral Motor/Sensory Function: Impaired Labial ROM: Reduced right Labial Symmetry: Abnormal symmetry right Labial Strength: Reduced Labial Sensation: Reduced Lingual ROM: Reduced right Lingual Symmetry: Abnormal symmetry right Lingual Strength: Reduced Lingual Sensation: Reduced Facial ROM: Reduced right Facial Symmetry: Right droop Facial Strength: Reduced Facial Sensation: Reduced Velum: Within Functional Limits Mandible: Within Functional Limits   Ice Chips Ice chips: Not tested   Thin Liquid Thin Liquid: Within functional limits Presentation: Cup;Straw    Nectar Thick Nectar Thick Liquid: Not tested   Honey Thick Honey Thick Liquid: Not tested   Puree Puree: Within functional limits   Solid   GO    Solid: Impaired Oral Phase Impairments: Impaired anterior to posterior transit;Reduced lingual movement/coordination Oral Phase Functional Implications: Oral holding;Oral residue      Moreen Fowler MS, CCC-SLP [phone] [facility] 03/27/2013,10:57 AM

## 2013-03-28 LAB — CBC
HCT: 36 % (ref 36.0–46.0)
Hemoglobin: 12.3 g/dL (ref 12.0–15.0)
MCH: 29.7 pg (ref 26.0–34.0)
MCHC: 34.2 g/dL (ref 30.0–36.0)
MCV: 87 fL (ref 78.0–100.0)

## 2013-03-28 LAB — BASIC METABOLIC PANEL
BUN: 29 mg/dL — ABNORMAL HIGH (ref 6–23)
Calcium: 8.4 mg/dL (ref 8.4–10.5)
GFR calc non Af Amer: 80 mL/min — ABNORMAL LOW (ref >90)
Glucose, Bld: 204 mg/dL — ABNORMAL HIGH (ref 70–99)

## 2013-03-28 MED ORDER — SODIUM CHLORIDE 0.9 % IV SOLN
INTRAVENOUS | Status: AC
  Administered 2013-03-28: 03:00:00 via INTRAVENOUS

## 2013-03-28 MED ORDER — LORAZEPAM 1 MG PO TABS
1.0000 mg | ORAL_TABLET | ORAL | Status: DC | PRN
  Administered 2013-03-28: 1 mg via ORAL
  Filled 2013-03-28: qty 1

## 2013-03-28 MED ORDER — SODIUM CHLORIDE 0.9 % IV SOLN
INTRAVENOUS | Status: DC
  Administered 2013-03-29 – 2013-03-30 (×3): via INTRAVENOUS

## 2013-03-28 MED ORDER — LORAZEPAM 2 MG/ML PO CONC: 1.0000 mg | ORAL | Status: DC | PRN

## 2013-03-28 MED ORDER — LORAZEPAM 1 MG PO TABS
2.0000 mg | ORAL_TABLET | ORAL | Status: DC | PRN
  Administered 2013-03-28 – 2013-03-30 (×4): 2 mg via ORAL
  Filled 2013-03-28 (×4): qty 2

## 2013-03-28 NOTE — Progress Notes (Signed)
Subjective: Very anxious to leave the hospital, very little sleep  Objective: Vital signs in last 24 hours: Temp:  [97.2 F (36.2 C)-98.2 F (36.8 C)] 97.4 F (36.3 C) (06/08 0800) Pulse Rate:  [64-102] 67 (06/08 0800) Resp:  [14-23] 17 (06/08 0800) BP: (96-154)/(40-108) 114/70 mmHg (06/08 0800) SpO2:  [92 %-99 %] 97 % (06/08 0839) Weight change:     Intake/Output from previous day: 06/07 0701 - 06/08 0700 In: 1463.3 [P.O.:330; I.V.:873.3; IV Piggyback:260] Out: 1020 [Urine:1020] Intake/Output this shift: Total I/O In: 100 [I.V.:100] Out: 400 [Urine:400]  Resp: clear to auscultation bilaterally Cardio: regular rate and rhythm, S1, S2 normal, no murmur, click, rub or gallop  Lab Results:  Recent Labs  03/27/13 0545 03/28/13 0512  WBC 11.4* 9.6  HGB 13.6 12.3  HCT 39.4 36.0  PLT 203 185   BMET  Recent Labs  03/27/13 0545 03/28/13 0512  NA 136 138  K 3.9 4.0  CL 97 103  CO2 26 27  GLUCOSE 202* 204*  BUN 18 29*  CREATININE 0.74 0.71  CALCIUM 9.3 8.4    Studies/Results: No results found.  Medications:  Scheduled: . antiseptic oral rinse  15 mL Mouth Rinse BID  . dexamethasone  12 mg Intravenous Q6H  . insulin aspart  0-9 Units Subcutaneous TID WC  . levETIRAcetam  500 mg Intravenous Q12H  . levothyroxine  75 mcg Oral QAC breakfast  . magnesium chloride  1 tablet Oral Daily  . pantoprazole  40 mg Oral Daily  . simvastatin  20 mg Oral QPM  . sodium chloride  3 mL Intravenous Q12H    Assessment/Plan: 1. Hypertension bp much  2. Glucose intolerance continue sliding scale 3. Brain tumor with planned surgery in 2 days, steroids helping much more alert and speech clear 4. I have recommended to her to remain here until surgery esp with hx vaiable bp control and hyperglycemia   LOS: 2 days   [facility] 03/28/2013, 8:44 AM

## 2013-03-28 NOTE — Progress Notes (Signed)
Speech Language Pathology Dysphagia Treatment Patient Details Name: Meghan Yu MRN: [id number] DOB: [birth year] Today's Date: 03/28/2013 Time: 1530-1600 SLP Time Calculation (min): 30 min  Assessment / Plan / Recommendation Clinical Impression  F/u for diet tolerance of regular consistency and thin liquids from initial BSE.  Improvement in oral prep and oral stage as well as improved lingual and labial ROM and strength in comparison to prior date.   Patient and RN report patient coughing on thin liquids by cup and straw this am  X2.  Patient observed directly with thin water by cup and straw and regular consistency.   No outward s/s of aspiration even after multiple trials. Continue current diet consistency with full supervision with all meals due to continued noted confusion.  ST to sign off as all goals met and education complete.  Please reconsult ST if warranted s/p surgery.     Diet Recommendation  Continue with Current Diet: Regular;Thin liquid    SLP Plan All goals met;Discharge SLP treatment due to (comment);Other (Comment) (met all goals and education complete. )      Swallowing Goals  SLP Swallowing Goals Swallow Study Goal #2 - Progress: Met  General Temperature Spikes Noted: No Respiratory Status: Room air Behavior/Cognition: Alert;Cooperative;Pleasant mood;Confused Oral Cavity - Dentition: Dentures, top;Dentures, bottom Patient Positioning: Upright in bed  Oral Cavity - Oral Hygiene Does patient have any of the following "at risk" factors?: Tongue - coated Patient is HIGH RISK - Oral Care Protocol followed (see row info): Yes Patient is AT RISK - Oral Care Protocol followed (see row info): Yes   Dysphagia Treatment Treatment focused on: Skilled observation of diet tolerance;Facilitation of pharyngeal phase Treatment Methods/Modalities: Skilled observation;Differential diagnosis Patient observed directly with PO's: Yes Type of PO's observed: Regular Feeding: Able to  feed self;Needs assist Liquids provided via: Cup;Straw   GO    Moreen Fowler MS, CCC-SLP [phone] [facility] 03/28/2013, 4:48 PM

## 2013-03-28 NOTE — Progress Notes (Signed)
eLink Physician-Brief Progress Note Patient Name: Meghan Yu DOB: July 01, 1932 MRN: [id number]  Date of Service  03/28/2013   HPI/Events of Note   oliguria  eICU Interventions  500cc over 5 hours      [name] 03/28/2013, 3:12 AM

## 2013-03-28 NOTE — Progress Notes (Signed)
Patient ID: Meghan Yu, female   DOB: 22-Mar-1932, 79 y.o.   MRN: [id number] BP 152/69  Pulse 86  Temp(Src) 97.4 F (36.3 C) (Oral)  Resp 14  Ht 5\' 1"  (1.549 m)  Wt 60.782 kg (134 lb)  BMI 25.33 kg/m2  SpO2 98%  Alert and oriented x 4 Speech is less dysarthric Facial droop much improved Strength on right side is better Following commands. Better with steroids.

## 2013-03-28 NOTE — Progress Notes (Signed)
eLink Physician-Brief Progress Note Patient Name: Flo Berroa DOB: Dec 15, 1931 MRN: [id number]  Date of Service  03/28/2013   HPI/Events of Note  Not drinking enough per RN   eICU Interventions  NS 75/h for oliguris   Intervention Category Intermediate Interventions: Oliguria - evaluation and management  Olen Eaves V. 03/28/2013, 6:50 PM

## 2013-03-29 LAB — GLUCOSE, CAPILLARY: Glucose-Capillary: 171 mg/dL — ABNORMAL HIGH (ref 70–99)

## 2013-03-29 MED ORDER — CEFAZOLIN SODIUM-DEXTROSE 2-3 GM-% IV SOLR
2.0000 g | INTRAVENOUS | Status: AC
  Administered 2013-03-30: 2 g via INTRAVENOUS
  Filled 2013-03-29: qty 50

## 2013-03-29 NOTE — Progress Notes (Signed)
Subjective: Patient reports Feels much better than she did the other day  Objective: Vital signs in last 24 hours: Temp:  [97.1 F (36.2 C)-97.8 F (36.6 C)] 97.4 F (36.3 C) (06/09 0400) Pulse Rate:  [68-96] 78 (06/09 0900) Resp:  [12-22] 18 (06/09 0800) BP: (107-165)/(52-124) 151/124 mmHg (06/09 0900) SpO2:  [93 %-98 %] 96 % (06/09 0900)  Intake/Output from previous day: 06/08 0701 - 06/09 0700 In: 1500 [P.O.:280; I.V.:1010; IV Piggyback:210] Out: 1280 [Urine:1280] Intake/Output this shift: Total I/O In: 150 [I.V.:150] Out: -   Right side is much stronger however patient has an obvious right cortical drift right lower extremity is stronger however also this remains weak to 4/5 compared to left and there is substantial apraxia of the right side in the arm and leg facial asymmetry is resolved.  Lab Results:  Recent Labs  03/27/13 0545 03/28/13 0512  WBC 11.4* 9.6  HGB 13.6 12.3  HCT 39.4 36.0  PLT 203 185   BMET  Recent Labs  03/27/13 0545 03/28/13 0512  NA 136 138  K 3.9 4.0  CL 97 103  CO2 26 27  GLUCOSE 202* 204*  BUN 18 29*  CREATININE 0.74 0.71  CALCIUM 9.3 8.4    Studies/Results: No results found.  Assessment/Plan: Improve neurologic function with large left frontal  LOS: 3 days  Surgery in am   Taria Castrillo J 03/29/2013, 9:41 AM

## 2013-03-29 NOTE — Progress Notes (Signed)
Subjective: Pt eating Bkfast Speech improved, right.  No c/o  Objective: Vital signs in last 24 hours: Temp:  [97.1 F (36.2 C)-97.8 F (36.6 C)] 97.4 F (36.3 C) (06/09 0400) Pulse Rate:  [68-108] 75 (06/09 0600) Resp:  [12-22] 17 (06/09 0600) BP: (107-165)/(52-99) 153/84 mmHg (06/09 0600) SpO2:  [94 %-98 %] 97 % (06/09 0600) Weight change:     Intake/Output from previous day: 06/08 0701 - 06/09 0700 In: 1500 [P.O.:280; I.V.:1010; IV Piggyback:210] Out: 1280 [Urine:1280] Intake/Output this shift:    General appearance: alert Resp: clear to auscultation bilaterally Cardio: regular rate and rhythm GI: soft, non-tender; bowel sounds normal; no masses,  no organomegaly Neurologic: Grossly normal  Lab Results:  Recent Labs  03/27/13 0545 03/28/13 0512  WBC 11.4* 9.6  HGB 13.6 12.3  HCT 39.4 36.0  PLT 203 185   BMET  Recent Labs  03/27/13 0545 03/28/13 0512  NA 136 138  K 3.9 4.0  CL 97 103  CO2 26 27  GLUCOSE 202* 204*  BUN 18 29*  CREATININE 0.74 0.71  CALCIUM 9.3 8.4    Studies/Results: No results found.  Medications: I have reviewed the patient's current medications.  Assessment/Plan: Hypertension: due to med- continue BP control- ok 2. Hyperglycemia- due to steriods- continue SS insulin  3. Brain tumor with planned surgery  tomorrow, steroids helping much more alert and speech clear, Right sided weakness better. 4. hypothyriodsim- continue Synthriod 5. Mild anxiety- ok 6: ok to be on Neurosurgery service after operation;  I will follow for medical issue with Dr Danielle Dess. 7. May need rehab after surgery. Ms Liwanag aware.   LOS: 3 days   Makyla Bye 03/29/2013, 8:29 AM

## 2013-03-29 NOTE — Progress Notes (Signed)
PULMONARY  / CRITICAL CARE MEDICINE  Name: Meghan Yu MRN: [id number] DOB: [birth year]    ADMISSION DATE:  03/26/2013 CONSULTATION DATE:  03/26/13 / [name] MD :  Barnett Abu PRIMARY SERVICE: Neurosurgery  CHIEF COMPLAINT:  Right hemiparesis and lethargy   BRIEF PATIENT DESCRIPTION:  79 years old female with PMH relevant for hypothyroidism and dyslipidemia. Admitted with right hemiparesis and lethargy. Brain imaging showed a large left frontotemporal mass with vasogenic edema and midline shift.  SIGNIFICANT EVENTS / STUDIES:  MRI of the brain Findings consistent with a left hemispheric frontal and posterior  frontal extra-axial mass, likely meningioma. There is invasion of  the superior sagittal sinus without occlusion. Moderate associated  edema in the left frontal and posterior frontal white matter with  left to right shift of approximately 1 cm.   LINES / TUBES: Peripheral IV's  SUBJECTIVE:  Feels much better.  Denies cough.  Tolerating Diet.  VITAL SIGNS: Temp:  [97.1 F (36.2 C)-97.8 F (36.6 C)] 97.4 F (36.3 C) (06/09 0400) Pulse Rate:  [68-108] 75 (06/09 0600) Resp:  [12-22] 17 (06/09 0600) BP: (107-165)/(52-99) 153/84 mmHg (06/09 0600) SpO2:  [94 %-98 %] 97 % (06/09 0600) Room air  INTAKE / OUTPUT: Intake/Output     06/08 0701 - 06/09 0700 06/09 0701 - 06/10 0700   P.O. 280    I.V. (mL/kg) 1010 (16.6)    IV Piggyback 210    Total Intake(mL/kg) 1500 (24.7)    Urine (mL/kg/hr) 1280 (0.9)    Total Output 1280     Net +220            PHYSICAL EXAMINATION: General: No distress HEENT: no oral lesions Cardiac: regular Chest: clear, good cough Abd: soft, non tender Ext: no edema Skin: no rashes Neuro: Awake, more alert, follows commands, speech clear/appropriate  LABS:  Recent Labs Lab 03/26/13 0320  03/26/13 0327 03/26/13 0340 03/26/13 1052 03/27/13 0545 03/28/13 0512  HGB 14.0  --  14.6  --   --  13.6 12.3  WBC 11.8*  --   --   --   --   11.4* 9.6  PLT 218  --   --   --   --  203 185  NA  --   < > 138  --  136 136 138  K  --   < > 4.0  --  3.5 3.9 4.0  CL  --   < > 100  --  95* 97 103  CO2  --   --   --   --  26 26 27   GLUCOSE  --   < > 189*  --  202* 202* 204*  BUN  --   < > 18  --  15 18 29*  CREATININE  --   < > 0.70  --  0.64 0.74 0.71  CALCIUM  --   --   --   --  9.8 9.3 8.4  AST  --   --   --   --  28  --   --   ALT  --   --   --   --  26  --   --   ALKPHOS  --   --   --   --  64  --   --   BILITOT  --   --   --   --  0.6  --   --   PROT  --   --   --   --  8.4*  --   --   ALBUMIN  --   --   --   --  4.6  --   --   TROPONINI  --   --   --  <0.30  --   --   --   < > = values in this interval not displayed.  CBG (last 3)   Recent Labs  03/28/13 1708 03/28/13 2216 03/29/13 0749  GLUCAP 176* 198* 171*     Imaging: No results found.   ASSESSMENT / PLAN: NEUROLOGIC A:  Right frontal mass with vasogenic edema and midline shift >> clinically improved with decadron. P:   - Continue decadron per neurosurgery - Plan for surgery 6/10 - Keppra for seizure prevention  PULMONARY A: Weak cough with concern for airway protection >> respiratory status adequate. P:   - Monitor clinically  CARDIOVASCULAR A: Elevated blood pressure. Hx of hyperlipidemia. P:  - Treat if SBP > 180 - continue simvastatin  RENAL A:  Oliguria. P:   - continue IV fluids - monitor renal fx, urine outpt  GASTROINTESTINAL A: Nutrition. P:   - change to CHO mod diet while on decadron - pepcid for SUP  HEMATOLOGIC A:  No issues P:  - Follow CBC - SCD for DVT prevention  INFECTIOUS A: Concern for aspiration potential >> no evidence for infection. P: - monitor clinically  ENDOCRINE A: Hx of hypothyroidism. Steroid induced hyperglycemia. P:   - Continue synthroid - SSI while on decadron  PCCM will f/u sign off.  Defer further medical management to Dr. Donette Larry.  Please call if additional help needed after she has  surgery.  Coralyn Helling, MD [facility] Pulmonary/Critical Care 03/29/2013, 8:53 AM Pager:  [phone] After 3pm call: [phone]

## 2013-03-30 ENCOUNTER — Encounter (HOSPITAL_COMMUNITY): Payer: Self-pay | Admitting: Certified Registered"

## 2013-03-30 ENCOUNTER — Encounter (HOSPITAL_COMMUNITY)
Admission: EM | Disposition: A | Discharge status: Rehabilitation Facility | Payer: Self-pay | Source: Home / Self Care | Attending: Internal Medicine

## 2013-03-30 ENCOUNTER — Inpatient Hospital Stay (HOSPITAL_COMMUNITY): Payer: Medicare Other

## 2013-03-30 ENCOUNTER — Encounter (HOSPITAL_COMMUNITY): Payer: Self-pay | Admitting: Anesthesiology

## 2013-03-30 ENCOUNTER — Inpatient Hospital Stay (HOSPITAL_COMMUNITY): Payer: Medicare Other | Admitting: Anesthesiology

## 2013-03-30 DIAGNOSIS — C719 Malignant neoplasm of brain, unspecified: Secondary | ICD-10-CM

## 2013-03-30 HISTORY — PX: CRANIOTOMY: SHX93

## 2013-03-30 HISTORY — DX: Malignant neoplasm of brain, unspecified: C71.9

## 2013-03-30 LAB — POCT I-STAT 4, (NA,K, GLUC, HGB,HCT)
Glucose, Bld: 220 mg/dL — ABNORMAL HIGH (ref 70–99)
HCT: 26 % — ABNORMAL LOW (ref 36.0–46.0)
Hemoglobin: 8.8 g/dL — ABNORMAL LOW (ref 12.0–15.0)
Potassium: 3.9 mEq/L (ref 3.5–5.1)

## 2013-03-30 LAB — GLUCOSE, CAPILLARY
Glucose-Capillary: 195 mg/dL — ABNORMAL HIGH (ref 70–99)
Glucose-Capillary: 198 mg/dL — ABNORMAL HIGH (ref 70–99)

## 2013-03-30 LAB — CBC
HCT: 34.2 % — ABNORMAL LOW (ref 36.0–46.0)
Hemoglobin: 11.8 g/dL — ABNORMAL LOW (ref 12.0–15.0)
MCH: 29.6 pg (ref 26.0–34.0)
MCHC: 34.5 g/dL (ref 30.0–36.0)
MCV: 85.7 fL (ref 78.0–100.0)
RBC: 3.99 MIL/uL (ref 3.87–5.11)

## 2013-03-30 LAB — ABO/RH: ABO/RH(D): A POS

## 2013-03-30 LAB — POCT I-STAT 7, (LYTES, BLD GAS, ICA,H+H)
Bicarbonate: 20.4 mEq/L (ref 20.0–24.0)
Calcium, Ion: 1.17 mmol/L (ref 1.13–1.30)
HCT: 31 % — ABNORMAL LOW (ref 36.0–46.0)
Hemoglobin: 10.5 g/dL — ABNORMAL LOW (ref 12.0–15.0)
Patient temperature: 35.9
pCO2 arterial: 36.4 mmHg (ref 35.0–45.0)
pO2, Arterial: 181 mmHg — ABNORMAL HIGH (ref 80.0–100.0)

## 2013-03-30 SURGERY — CRANIOTOMY TUMOR EXCISION
Anesthesia: General | Laterality: Left | Wound class: Clean

## 2013-03-30 MED ORDER — ONDANSETRON HCL 4 MG/2ML IJ SOLN: 4.0000 mg | INTRAMUSCULAR | Status: DC | PRN

## 2013-03-30 MED ORDER — PROMETHAZINE HCL 25 MG/ML IJ SOLN: 6.2500 mg | INTRAMUSCULAR | Status: DC | PRN

## 2013-03-30 MED ORDER — ONDANSETRON HCL 4 MG PO TABS: 4.0000 mg | ORAL_TABLET | ORAL | Status: DC | PRN

## 2013-03-30 MED ORDER — MEPERIDINE HCL 25 MG/ML IJ SOLN: 6.2500 mg | INTRAMUSCULAR | Status: DC | PRN

## 2013-03-30 MED ORDER — FENTANYL CITRATE 0.05 MG/ML IJ SOLN: 25.0000 ug | INTRAMUSCULAR | Status: DC | PRN

## 2013-03-30 MED ORDER — SODIUM CHLORIDE 0.9 % IV SOLN
INTRAVENOUS | Status: AC
  Administered 2013-03-30 (×2): via INTRAVENOUS
  Filled 2013-03-30: qty 500

## 2013-03-30 MED ORDER — LABETALOL HCL 5 MG/ML IV SOLN
INTRAVENOUS | Status: AC
  Filled 2013-03-30: qty 4

## 2013-03-30 MED ORDER — HEMOSTATIC AGENTS (NO CHARGE) OPTIME
TOPICAL | Status: DC | PRN
  Administered 2013-03-30 (×2): 1 via TOPICAL

## 2013-03-30 MED ORDER — MICROFIBRILLAR COLL HEMOSTAT EX PADS
MEDICATED_PAD | CUTANEOUS | Status: DC | PRN
  Administered 2013-03-30: 1 via TOPICAL

## 2013-03-30 MED ORDER — BACITRACIN ZINC 500 UNIT/GM EX OINT
TOPICAL_OINTMENT | CUTANEOUS | Status: DC | PRN
  Administered 2013-03-30: 1 via TOPICAL

## 2013-03-30 MED ORDER — DEXAMETHASONE 2 MG PO TABS
2.0000 mg | ORAL_TABLET | Freq: Three times a day (TID) | ORAL | Status: DC
  Administered 2013-03-30 – 2013-04-01 (×6): 2 mg via ORAL
  Filled 2013-03-30 (×9): qty 1

## 2013-03-30 MED ORDER — BACITRACIN 50000 UNITS IM SOLR
INTRAMUSCULAR | Status: AC
  Filled 2013-03-30: qty 1

## 2013-03-30 MED ORDER — PHENYLEPHRINE HCL 10 MG/ML IJ SOLN
10.0000 mg | INTRAVENOUS | Status: DC | PRN
  Administered 2013-03-30: 25 ug/min via INTRAVENOUS

## 2013-03-30 MED ORDER — PROMETHAZINE HCL 25 MG PO TABS: 12.5000 mg | ORAL_TABLET | ORAL | Status: DC | PRN

## 2013-03-30 MED ORDER — NEOSTIGMINE METHYLSULFATE 1 MG/ML IJ SOLN
INTRAMUSCULAR | Status: DC | PRN
  Administered 2013-03-30: 3 mg via INTRAVENOUS

## 2013-03-30 MED ORDER — GLYCOPYRROLATE 0.2 MG/ML IJ SOLN
INTRAMUSCULAR | Status: DC | PRN
  Administered 2013-03-30: 0.4 mg via INTRAVENOUS

## 2013-03-30 MED ORDER — PROPOFOL 10 MG/ML IV BOLUS
INTRAVENOUS | Status: DC | PRN
  Administered 2013-03-30: 30 mg via INTRAVENOUS
  Administered 2013-03-30: 20 mg via INTRAVENOUS
  Administered 2013-03-30: 50 mg via INTRAVENOUS
  Administered 2013-03-30: 20 mg via INTRAVENOUS
  Administered 2013-03-30: 30 mg via INTRAVENOUS

## 2013-03-30 MED ORDER — 0.9 % SODIUM CHLORIDE (POUR BTL) OPTIME
TOPICAL | Status: DC | PRN
  Administered 2013-03-30 (×2): 1000 mL

## 2013-03-30 MED ORDER — PHENOL 1.4 % MT LIQD
OROMUCOSAL | Status: AC
  Filled 2013-03-30: qty 177

## 2013-03-30 MED ORDER — VECURONIUM BROMIDE 10 MG IV SOLR
INTRAVENOUS | Status: DC | PRN
  Administered 2013-03-30 (×2): 1 mg via INTRAVENOUS
  Administered 2013-03-30: 2 mg via INTRAVENOUS
  Administered 2013-03-30: 1 mg via INTRAVENOUS
  Administered 2013-03-30: 2 mg via INTRAVENOUS

## 2013-03-30 MED ORDER — PHENOL 1.4 % MT LIQD
1.0000 | OROMUCOSAL | Status: DC | PRN
  Administered 2013-03-30: 1 via OROMUCOSAL

## 2013-03-30 MED ORDER — SODIUM CHLORIDE 0.9 % IV SOLN
500.0000 mg | INTRAVENOUS | Status: AC
  Filled 2013-03-30 (×2): qty 5

## 2013-03-30 MED ORDER — OXYCODONE HCL 5 MG PO TABS: 5.0000 mg | ORAL_TABLET | Freq: Once | ORAL | Status: DC | PRN

## 2013-03-30 MED ORDER — LABETALOL HCL 5 MG/ML IV SOLN
10.0000 mg | INTRAVENOUS | Status: DC | PRN
  Administered 2013-03-30: 20 mg via INTRAVENOUS

## 2013-03-30 MED ORDER — SODIUM CHLORIDE 0.9 % IV SOLN: 500.0000 mg | Freq: Two times a day (BID) | INTRAVENOUS | Status: DC

## 2013-03-30 MED ORDER — ROCURONIUM BROMIDE 100 MG/10ML IV SOLN
INTRAVENOUS | Status: DC | PRN
  Administered 2013-03-30: 50 mg via INTRAVENOUS

## 2013-03-30 MED ORDER — MIDAZOLAM HCL 2 MG/2ML IJ SOLN: 0.5000 mg | Freq: Once | INTRAMUSCULAR | Status: DC | PRN

## 2013-03-30 MED ORDER — THROMBIN 20000 UNITS EX SOLR
CUTANEOUS | Status: DC | PRN
  Administered 2013-03-30 (×2): via TOPICAL

## 2013-03-30 MED ORDER — PANTOPRAZOLE SODIUM 40 MG IV SOLR
40.0000 mg | Freq: Every day | INTRAVENOUS | Status: DC
  Administered 2013-03-30: 40 mg via INTRAVENOUS
  Filled 2013-03-30 (×2): qty 40

## 2013-03-30 MED ORDER — BUPIVACAINE HCL (PF) 0.5 % IJ SOLN
INTRAMUSCULAR | Status: DC | PRN
  Administered 2013-03-30: 8 mL

## 2013-03-30 MED ORDER — MORPHINE SULFATE 2 MG/ML IJ SOLN: 1.0000 mg | INTRAMUSCULAR | Status: DC | PRN

## 2013-03-30 MED ORDER — LIDOCAINE-EPINEPHRINE 1 %-1:100000 IJ SOLN
INTRAMUSCULAR | Status: DC | PRN
  Administered 2013-03-30: 8 mL

## 2013-03-30 MED ORDER — OXYCODONE HCL 5 MG/5ML PO SOLN: 5.0000 mg | Freq: Once | ORAL | Status: DC | PRN

## 2013-03-30 MED ORDER — ALBUMIN HUMAN 5 % IV SOLN
INTRAVENOUS | Status: DC | PRN
  Administered 2013-03-30: 11:00:00 via INTRAVENOUS

## 2013-03-30 MED ORDER — SODIUM CHLORIDE 0.9 % IR SOLN
Status: DC | PRN
  Administered 2013-03-30: 09:00:00

## 2013-03-30 MED ORDER — HYDROCODONE-ACETAMINOPHEN 5-325 MG PO TABS
1.0000 | ORAL_TABLET | ORAL | Status: DC | PRN
  Administered 2013-03-30 – 2013-03-31 (×2): 1 via ORAL
  Filled 2013-03-30 (×2): qty 1

## 2013-03-30 MED ORDER — ONDANSETRON HCL 4 MG/2ML IJ SOLN
INTRAMUSCULAR | Status: DC | PRN
  Administered 2013-03-30: 4 mg via INTRAVENOUS

## 2013-03-30 MED ORDER — FENTANYL CITRATE 0.05 MG/ML IJ SOLN
INTRAMUSCULAR | Status: DC | PRN
  Administered 2013-03-30: 25 ug via INTRAVENOUS
  Administered 2013-03-30: 100 ug via INTRAVENOUS
  Administered 2013-03-30: 25 ug via INTRAVENOUS
  Administered 2013-03-30: 500 ug via INTRAVENOUS

## 2013-03-30 MED ORDER — LIDOCAINE HCL 4 % MT SOLN
OROMUCOSAL | Status: DC | PRN
  Administered 2013-03-30: 4 mL via TOPICAL

## 2013-03-30 MED ORDER — SODIUM CHLORIDE 0.9 % IV SOLN
INTRAVENOUS | Status: DC
  Administered 2013-03-30 – 2013-03-31 (×3): via INTRAVENOUS

## 2013-03-30 SURGICAL SUPPLY — 87 items
BAG DECANTER FOR FLEXI CONT (MISCELLANEOUS) ×2 IMPLANT
BANDAGE GAUZE ELAST BULKY 4 IN (GAUZE/BANDAGES/DRESSINGS) ×2 IMPLANT
BIT DRILL WIRE PASS 1.3MM (BIT) IMPLANT
BLADE SURG ROTATE 9660 (MISCELLANEOUS) ×6 IMPLANT
BRUSH SCRUB EZ 1% IODOPHOR (MISCELLANEOUS) ×2 IMPLANT
BRUSH SCRUB EZ PLAIN DRY (MISCELLANEOUS) ×2 IMPLANT
BUR ACORN 6.0 PRECISION (BURR) ×2 IMPLANT
BUR ROUTER D-58 CRANI (BURR) IMPLANT
CANISTER SUCTION 2500CC (MISCELLANEOUS) ×2 IMPLANT
CLIP TI MEDIUM 6 (CLIP) IMPLANT
CLOTH BEACON ORANGE TIMEOUT ST (SAFETY) ×2 IMPLANT
CONT SPEC 4OZ CLIKSEAL STRL BL (MISCELLANEOUS) ×4 IMPLANT
CORDS BIPOLAR (ELECTRODE) ×2 IMPLANT
COTTONBALL LRG STERILE PKG (GAUZE/BANDAGES/DRESSINGS) IMPLANT
COVER TABLE BACK 60X90 (DRAPES) ×4 IMPLANT
DECANTER SPIKE VIAL GLASS SM (MISCELLANEOUS) ×2 IMPLANT
DRAIN CHANNEL 10M FLAT 3/4 FLT (DRAIN) IMPLANT
DRAIN SUBARACHNOID (WOUND CARE) IMPLANT
DRAPE LONG LASER MIC (DRAPES) IMPLANT
DRAPE MICROSCOPE LEICA (MISCELLANEOUS) ×2 IMPLANT
DRAPE SURG IRRIG POUCH 19X23 (DRAPES) IMPLANT
DRAPE WARM FLUID 44X44 (DRAPE) ×2 IMPLANT
DRESSING TELFA 8X3 (GAUZE/BANDAGES/DRESSINGS) ×2 IMPLANT
DRILL WIRE PASS 1.3MM (BIT)
DRSG ADAPTIC 3X8 NADH LF (GAUZE/BANDAGES/DRESSINGS) ×2 IMPLANT
DURAPREP 6ML APPLICATOR 50/CS (WOUND CARE) ×2 IMPLANT
ELECT CAUTERY BLADE 6.4 (BLADE) ×2 IMPLANT
ELECT REM PT RETURN 9FT ADLT (ELECTROSURGICAL) ×2
ELECTRODE REM PT RTRN 9FT ADLT (ELECTROSURGICAL) ×1 IMPLANT
EVACUATOR 1/8 PVC DRAIN (DRAIN) IMPLANT
EVACUATOR SILICONE 100CC (DRAIN) IMPLANT
GAUZE SPONGE 4X4 16PLY XRAY LF (GAUZE/BANDAGES/DRESSINGS) IMPLANT
GLOVE BIO SURGEON STRL SZ7.5 (GLOVE) IMPLANT
GLOVE BIOGEL M 8.0 STRL (GLOVE) ×2 IMPLANT
GLOVE BIOGEL PI IND STRL 7.0 (GLOVE) ×1 IMPLANT
GLOVE BIOGEL PI IND STRL 7.5 (GLOVE) IMPLANT
GLOVE BIOGEL PI IND STRL 8.5 (GLOVE) ×1 IMPLANT
GLOVE BIOGEL PI INDICATOR 7.0 (GLOVE) ×1
GLOVE BIOGEL PI INDICATOR 7.5 (GLOVE)
GLOVE BIOGEL PI INDICATOR 8.5 (GLOVE) ×1
GLOVE ECLIPSE 8.5 STRL (GLOVE) ×2 IMPLANT
GLOVE EXAM NITRILE LRG STRL (GLOVE) IMPLANT
GLOVE EXAM NITRILE MD LF STRL (GLOVE) IMPLANT
GLOVE EXAM NITRILE XL STR (GLOVE) IMPLANT
GLOVE EXAM NITRILE XS STR PU (GLOVE) IMPLANT
GLOVE SS BIOGEL STRL SZ 6.5 (GLOVE) ×3 IMPLANT
GLOVE SUPERSENSE BIOGEL SZ 6.5 (GLOVE) ×3
GOWN BRE IMP SLV AUR LG STRL (GOWN DISPOSABLE) ×2 IMPLANT
GOWN BRE IMP SLV AUR XL STRL (GOWN DISPOSABLE) ×2 IMPLANT
GOWN STRL REIN 2XL LVL4 (GOWN DISPOSABLE) ×2 IMPLANT
HEMOSTAT SURGICEL 2X14 (HEMOSTASIS) IMPLANT
HOOK DURA (MISCELLANEOUS) ×2 IMPLANT
KIT BASIN OR (CUSTOM PROCEDURE TRAY) ×2 IMPLANT
KIT ROOM TURNOVER OR (KITS) ×2 IMPLANT
NEEDLE HYPO 22GX1.5 SAFETY (NEEDLE) ×2 IMPLANT
NS IRRIG 1000ML POUR BTL (IV SOLUTION) ×2 IMPLANT
PACK CRANIOTOMY (CUSTOM PROCEDURE TRAY) ×2 IMPLANT
PAD ARMBOARD 7.5X6 YLW CONV (MISCELLANEOUS) ×6 IMPLANT
PATTIES SURGICAL .25X.25 (GAUZE/BANDAGES/DRESSINGS) ×2 IMPLANT
PATTIES SURGICAL .5 X.5 (GAUZE/BANDAGES/DRESSINGS) ×2 IMPLANT
PATTIES SURGICAL .5 X3 (DISPOSABLE) ×2 IMPLANT
PATTIES SURGICAL 1/4 X 3 (GAUZE/BANDAGES/DRESSINGS) IMPLANT
PATTIES SURGICAL 1X1 (DISPOSABLE) ×2 IMPLANT
PATTIES SURGICAL 3 X3 (GAUZE/BANDAGES/DRESSINGS)
PATTIES SURGICAL 3X3 (GAUZE/BANDAGES/DRESSINGS) IMPLANT
PIN MAYFIELD SKULL DISP (PIN) IMPLANT
PLATE 1.5  2HOLE LNG NEURO (Plate) ×3 IMPLANT
PLATE 1.5 2HOLE LNG NEURO (Plate) ×3 IMPLANT
PLATE 1.5/0.5 18.5MM BURR HOLE (Plate) ×2 IMPLANT
SCREW SELF DRILL HT 1.5/4MM (Screw) ×22 IMPLANT
SPONGE GAUZE 4X4 12PLY (GAUZE/BANDAGES/DRESSINGS) ×2 IMPLANT
SPONGE NEURO XRAY DETECT 1X3 (DISPOSABLE) IMPLANT
SPONGE SURGIFOAM ABS GEL 100 (HEMOSTASIS) ×8 IMPLANT
STAPLER SKIN PROX WIDE 3.9 (STAPLE) ×2 IMPLANT
SUT ETHILON 3 0 FSL (SUTURE) ×2 IMPLANT
SUT NURALON 4 0 TR CR/8 (SUTURE) ×6 IMPLANT
SUT VIC AB 2-0 CP2 18 (SUTURE) ×6 IMPLANT
SYR 20ML ECCENTRIC (SYRINGE) ×2 IMPLANT
SYR CONTROL 10ML LL (SYRINGE) ×2 IMPLANT
TAPE SURG TRANSPORE 1 IN (GAUZE/BANDAGES/DRESSINGS) ×1 IMPLANT
TAPE SURGICAL TRANSPORE 1 IN (GAUZE/BANDAGES/DRESSINGS) ×1
TIP SONASTAR STD MISONIX 1.9 (TRAY / TRAY PROCEDURE) IMPLANT
TOWEL OR 17X24 6PK STRL BLUE (TOWEL DISPOSABLE) ×2 IMPLANT
TOWEL OR 17X26 10 PK STRL BLUE (TOWEL DISPOSABLE) ×2 IMPLANT
TRAY FOLEY CATH 14FRSI W/METER (CATHETERS) IMPLANT
UNDERPAD 30X30 INCONTINENT (UNDERPADS AND DIAPERS) IMPLANT
WATER STERILE IRR 1000ML POUR (IV SOLUTION) ×2 IMPLANT

## 2013-03-30 NOTE — Anesthesia Procedure Notes (Signed)
Performed by: Coralee Rud Pre-anesthesia Checklist: Patient identified, Timeout performed, Emergency Drugs available, Suction available and Patient being monitored Patient Re-evaluated:Patient Re-evaluated prior to inductionPreoxygenation: Pre-oxygenation with 100% oxygen Intubation Type: IV induction Ventilation: Mask ventilation without difficulty and Oral airway inserted - appropriate to patient size Laryngoscope Size: Hyacinth Meeker and 2 Grade View: Grade I Tube type: Oral Tube size: 7.5 mm Number of attempts: 1 Airway Equipment and Method: Stylet and LTA kit utilized Placement Confirmation: ETT inserted through vocal cords under direct vision,  breath sounds checked- equal and bilateral and positive ETCO2 Secured at: 21 cm Tube secured with: Tape Dental Injury: Teeth and Oropharynx as per pre-operative assessment

## 2013-03-30 NOTE — Preoperative (Signed)
Beta Blockers   Reason not to administer Beta Blockers:Not Applicable 

## 2013-03-30 NOTE — Op Note (Signed)
Date of surgery: 03/30/2013 Preoperative diagnosis: Left frontal meningioma Postoperative diagnosis: Left frontal meningioma Procedure: Left frontal craniotomy and gross total resection of meningioma with operating microscope microdissection technique Surgeon: Bard Herbert First assistant: Hilda Lias Anesthesia: Gen. endotracheal Indications: Patient is a 79 year old individual who started to develop significant right-sided weakness and lethargy in addition to confusion she was found to have a large left frontal meningioma the patient was started on high-dose steroids this helped peritumoral edema and clinically she improved however she still had substantial right-sided weakness particularly in the arm and the leg. She was advised regarding surgical removal of this tumor. The tumor on scan involves the parasagittal region attachment to the [facility] it hasn't en plaque portion that is attached to the dura measures 6 cm in greatest length 5  cm in greatest width and extends beyond the depth of the Falx.  Procedure: Patient was brought to the operating room supine on a stretcher. After the smooth induction of general endotracheal anesthesia, she was placed in a 3 pin headrest. The head was secured turned slightly to the right side. The scalp was then prepped by shaving a portion over the region of the coronal suture to create a bifrontal incision. The wound was cleansed with Hibiclens alcohol and DuraPrep. He was then draped sterilely. A bifrontal incision was created with the greater extension off to the left side. The scalp was dissected anteriorly and the periosteum was cleaned from the outer table of the bone. The midline was checked in the region of the sagittal sinus in the coronal suture was identified. A flap was based on these 2 landmarks and centered on the coronal suture. 3 bur holes were then placed one at the anterior portion across the midline one at the posterior extent across the midline and  one at the left lateral portion and the lateral frontal region. These were then connected with the craniotomy soft. The bone flap was elevated. Substantial bleeding from the dura was tamped knotted using a sheet of Gelfoam and a number of sponges. The posterior portion of the sinus had the greatest bleeding. This at the knotted after a few minutes of direct pressure. Then the circumference of the dural exposure was secured with tach up sutures. Hemostasis was ultimately achieved from the dura with a cautious use of bipolar cautery and the tack up sutures. The dura was then opened from the lateral margin based anteriorly and posteriorly. A meningioma was immediately encountered. Dissection from the cortical plane was achieved with some moderate difficulty as there were some small pial vessels that fed into the tumor. These were carefully cauterized and dissected and the tumor was exposed. The tumor was then dissected from the dura itself and this allowed peeling the dura back to the region of the sagittal sinus. The tumor was dissected from the sagittal sinus. There was a point of attachment at the midportion of the sagittal sinus that was dissected free with some tumor left attached to the sagittal sinus. Hemostasis was carefully and meticulously maintained all the time. As the dissection continued portion of the deep frontal tumor was noted to be attached to the pericallosal branches of the anterior cerebral artery. The operating microscope was used at this point and with Dr. Jeral Fruit retracting the tumor was able to dissect the pericallosal arteries on the t attachments and removed tumor that was grown beyond the pericallosal arteries. Ultimately a circumferential dissection in various planes in front of the medial aspect and on the lateral aspect  and the posterior aspect of the tumor we were able to gradually dissect the largest portion of the tumor. This was amputated at the attachments to the Falx. Then by  dissecting the residual tumor we are able to achieve her gross total resection. Inspection of the tumor bed and the subfrontal region using loupe magnification and the operating microscope yielded no residual tumor. With the dissection being completed and hemostasis already achieved the dura was then closed primarily with 4-0 Nurolon sutures. The bone flap was secured with several plates and burr hole cover anteriorly tack up sutures were placed through the craniotomy plate secure the dura to the bone flap and ultimately the temporalis muscle which was dissected from the lateral portion of the plate was tacked onto the bone flap itself. The galea was closed 2-0 Vicryl and surgical staples in 3-0 nylon were used to close the scalp. Blood loss for the procedure was estimated at 500 cc.

## 2013-03-30 NOTE — Anesthesia Postprocedure Evaluation (Signed)
  Anesthesia Post-op Note  Patient: [name]  Procedure(s) Performed: Procedure(s) with comments: LEFT FRONTAL CRANIOTOMY FOR RESECTION OF MENINGIOMA (Left) - Left Frontal Craniotomy for tumor excision  Patient Location: PACU  Anesthesia Type:General  Level of Consciousness: awake, alert , oriented and patient cooperative  Airway and Oxygen Therapy: Patient Spontanous Breathing and Patient connected to nasal cannula oxygen  Post-op Pain: none  Post-op Assessment: Post-op Vital signs reviewed, Patient's Cardiovascular Status Stable, Respiratory Function Stable, Patent Airway, No signs of Nausea or vomiting and Pain level controlled  Post-op Vital Signs: Reviewed and stable  Complications: No apparent anesthesia complications

## 2013-03-30 NOTE — Anesthesia Preprocedure Evaluation (Addendum)
Anesthesia Evaluation  Patient identified by MRN, date of birth, ID band Patient awake    Reviewed: Allergy & Precautions, H&P , NPO status , Patient's Chart, lab work & pertinent test results  History of Anesthesia Complications Negative for: history of anesthetic complications  Airway Mallampati: I TM Distance: >3 FB Neck ROM: Full    Dental  (+) Edentulous Upper, Partial Lower and Dental Advisory Given   Pulmonary neg pulmonary ROS,  breath sounds clear to auscultation  Pulmonary exam normal       Cardiovascular negative cardio ROS  Rhythm:Regular Rate:Normal     Neuro/Psych Large R frontal meningioma with 2 satellite lesions, presented with R facial droop and hemiparesis, largely resolved with steroid treatment  negative psych ROS   GI/Hepatic Neg liver ROS, GERD-  Medicated and Controlled,  Endo/Other  Hypothyroidism   Renal/GU negative Renal ROS     Musculoskeletal   Abdominal   Peds  Hematology negative hematology ROS (+)   Anesthesia Other Findings   Reproductive/Obstetrics                          Anesthesia Physical Anesthesia Plan  ASA: III  Anesthesia Plan: General   Post-op Pain Management:    Induction: Intravenous  Airway Management Planned: Oral ETT  Additional Equipment: Arterial line  Intra-op Plan:   Post-operative Plan: Possible Post-op intubation/ventilation  Informed Consent: I have reviewed the patients History and Physical, chart, labs and discussed the procedure including the risks, benefits and alternatives for the proposed anesthesia with the patient or authorized representative who has indicated his/her understanding and acceptance.   Dental advisory given  Plan Discussed with: CRNA and Surgeon  Anesthesia Plan Comments: (Plan routine monitors, A line, GETA with possible post op ventiltation)        Anesthesia Quick Evaluation

## 2013-03-30 NOTE — Progress Notes (Signed)
Patient ID: Meghan Yu, female   DOB: Jul 29, 1932, 79 y.o.   MRN: [id number] Alert, moving right arm fairly well,no facial weakness appreciated. Right leg weak about 3/5. Receiving one unit prbc. To icu. Stable, Dr. Jeral Fruit will follow in my absence. Marland Kitchen

## 2013-03-30 NOTE — Transfer of Care (Signed)
Immediate Anesthesia Transfer of Care Note  Patient: Meghan Yu  Procedure(s) Performed: Procedure(s) with comments: LEFT FRONTAL CRANIOTOMY FOR RESECTION OF MENINGIOMA (Left) - Left Frontal Craniotomy for tumor excision  Patient Location: PACU  Anesthesia Type:General  Level of Consciousness: awake, sedated, patient cooperative and responds to stimulation  Airway & Oxygen Therapy: Patient Spontanous Breathing and Patient connected to nasal cannula oxygen  Post-op Assessment: Report given to PACU RN, Post -op Vital signs reviewed and stable, Patient moving all extremities and Patient able to stick tongue midline  Post vital signs: Reviewed and stable  Complications: No apparent anesthesia complications

## 2013-03-31 ENCOUNTER — Encounter (HOSPITAL_COMMUNITY): Payer: Self-pay | Admitting: Physical Medicine and Rehabilitation

## 2013-03-31 DIAGNOSIS — G8110 Spastic hemiplegia affecting unspecified side: Secondary | ICD-10-CM

## 2013-03-31 LAB — BASIC METABOLIC PANEL
Calcium: 7.6 mg/dL — ABNORMAL LOW (ref 8.4–10.5)
GFR calc Af Amer: >90 mL/min (ref >90)
GFR calc non Af Amer: 84 mL/min — ABNORMAL LOW (ref >90)
Glucose, Bld: 159 mg/dL — ABNORMAL HIGH (ref 70–99)
Sodium: 139 mEq/L (ref 135–145)

## 2013-03-31 LAB — GLUCOSE, CAPILLARY
Glucose-Capillary: 142 mg/dL — ABNORMAL HIGH (ref 70–99)
Glucose-Capillary: 136 mg/dL — ABNORMAL HIGH (ref 70–99)
Glucose-Capillary: 150 mg/dL — ABNORMAL HIGH (ref 70–99)
Glucose-Capillary: 159 mg/dL — ABNORMAL HIGH (ref 70–99)

## 2013-03-31 LAB — CBC
MCH: 29.8 pg (ref 26.0–34.0)
Platelets: 161 10*3/uL (ref 150–400)
RBC: 3.79 MIL/uL — ABNORMAL LOW (ref 3.87–5.11)
WBC: 10.4 10*3/uL (ref 4.0–10.5)

## 2013-03-31 MED ORDER — LEVETIRACETAM 500 MG PO TABS
500.0000 mg | ORAL_TABLET | Freq: Two times a day (BID) | ORAL | Status: DC
  Administered 2013-03-31 – 2013-04-01 (×3): 500 mg via ORAL
  Filled 2013-03-31 (×4): qty 1

## 2013-03-31 MED ORDER — PANTOPRAZOLE SODIUM 40 MG PO TBEC
40.0000 mg | DELAYED_RELEASE_TABLET | Freq: Every day | ORAL | Status: DC
  Administered 2013-03-31 – 2013-04-01 (×2): 40 mg via ORAL
  Filled 2013-03-31 (×2): qty 1

## 2013-03-31 NOTE — Progress Notes (Signed)
Subjective: S/p Resection of large Meningioma Awake , alert Right leg weakness BP ok  Objective: Vital signs in last 24 hours: Temp:  [97 F (36.1 C)-98.7 F (37.1 C)] 98.1 F (36.7 C) (06/11 0400) Pulse Rate:  [54-91] 61 (06/11 0700) Resp:  [10-25] 14 (06/11 0700) BP: (110-158)/(40-92) 144/49 mmHg (06/11 0700) SpO2:  [93 %-100 %] 95 % (06/11 0700) Arterial Line BP: (78-142)/(65-123) 83/75 mmHg (06/11 0500) Weight change:     Intake/Output from previous day: 06/10 0701 - 06/11 0700 In: 4935 [P.O.:170; I.V.:4200; IV Piggyback:565] Out: 2935 [Urine:2435; Blood:500] Intake/Output this shift:    General appearance: alert Resp: clear to auscultation bilaterally Cardio: regular rate and rhythm Neurologic: Motor: right arm 3/5; leg- 2/5  Lab Results:  Recent Labs  03/30/13 0350  03/30/13 1141 03/31/13 0545  WBC 6.1  --   --  10.4  HGB 11.8*  < > 8.8* 11.3*  HCT 34.2*  < > 26.0* 32.3*  PLT 180  --   --  161  < > = values in this interval not displayed. BMET  Recent Labs  03/30/13 1141 03/31/13 0545  NA 143 139  K 3.9 3.6  CL  --  107  CO2  --  24  GLUCOSE 220* 159*  BUN  --  20  CREATININE  --  0.62  CALCIUM  --  7.6*    Studies/Results: Ct Head Wo Contrast  03/30/2013   *RADIOLOGY REPORT*  Clinical Data: Follow-up left frontal craniotomy.  CT HEAD WITHOUT CONTRAST  Technique:  Contiguous axial images were obtained from the base of the skull through the vertex without contrast.  Comparison: MRI brain 03/26/2013.  CT head 03/26/2013.  Findings: Since the previous study, there are interval postoperative changes with left frontal craniotomy and resection of the previously identified extra-axial mass.  There is pneumocephalus consistent with postoperative change.  There is residual low attenuation change consistent with edema in the cortex and deep white matter in the left frontal and parietal region. This appears generally stable since the previous study.  Mild  hyperintensity along the left side of the anterior falx probably representing focal postoperative hemorrhage.  There is improved mass effect with decreased midline shift, now measuring about 4 mm. Ventricles are not dilated.  Mild effacement of the left anterior horn of the lateral ventricle.  Focal calcification in the right temporal region consistent with small calcified meningioma, measuring about 11 mm diameter, stable since previous study.  IMPRESSION: Postoperative changes in the left frontal region as described. Residual edema in the left frontal parietal region.  4 mm left to right midline shift represents significant improvement.   Original Report Authenticated By: Burman Nieves, M.D.    Medications: I have reviewed the patient's current medications.  Assessment/Plan: Hypertension: due to med- continue BP control- ok  2. Hyperglycemia- Better-  due to steriods- continue SS insulin  3. Brain tumor -s/p Resection of large meningioma continue care per neurosurgery- Decadron, keppra- ? Change to PO 4. hypothyriodsim- continue Synthriod  5. Mild anxiety- ok 6. PT consult/ d/c foley 7. Socail work consult - for inpatient rehab vs SNF-rehab 8. Anemia better after 2 unit PRBC.   LOS: 5 days   Cam Dauphin 03/31/2013, 7:36 AM

## 2013-03-31 NOTE — Evaluation (Signed)
Physical Therapy Evaluation Patient Details Name: Meghan Yu MRN: [id number] DOB: [birth year] Today's Date: 03/31/2013 Time: 4098-1191 PT Time Calculation (min): 45 min  PT Assessment / Plan / Recommendation Clinical Impression  Pt is a 79 yo female s/p craniotomy tumor excision. Pt is alert and oriented x4 but presents with significantly delayed processing. Pt with noted R UE/LE extensor tone limiting functional use of UE/LE. In addition pt with mild apraxia in R UE/LE. Pt is able to follow 1 step commands consistently however requires significant increase in time to process and sequence. Pt with noted visual deficits as demo'd by inability to track to R side side however does respond to auditory stimuli. Pt with noted R sided neglect during ambulation as well. Pt is an excellent canidate for CIR to address above deficits and achieve maximal functional recovery to return to mod I function for safe transition home.      PT Assessment  Patient needs continued PT services    Follow Up Recommendations  CIR    Does the patient have the potential to tolerate intense rehabilitation      Barriers to Discharge Decreased caregiver support (daughter works during day, unclear husband availability)      Teacher, adult education for Other Services Rehab consult;OT consult;Speech consult   Frequency Min 4X/week    Precautions / Restrictions Precautions Precautions: Fall Restrictions Weight Bearing Restrictions: No   Pertinent Vitals/Pain Vision assessed informally, pt with apparent R sided neglect, unable to visually track across midline towards the Right. Questionable nystagmus       Mobility  Transfers Transfers: Sit to Stand;Stand to Sit Sit to Stand: With upper extremity assist;1: +2 Total assist;From chair/3-in-1 Sit to Stand: Patient Percentage: 60% Stand to Sit: 1: +2 Total assist;To chair/3-in-1;With upper extremity assist Stand to Sit: Patient  Percentage: 70% Details for Transfer Assistance: Pt with sequencing difficulty with transfers. Pt initiated sit to stand and then stopped after moving hips forward and weightshifting forward. Pt required repeated verbal and tactile cues to complete the transition and assistance lifting from the chair. Pt then required assistance weight shifting forward to bring body of R foot for balanced standing. With sitting, pt was able to initiate turning to put hips in front of the chair but again paused mid-way through the activity and required v/c's to sit before completing the turn. The pt then required assistance to place hands on armrests and lower weight into the chair.  Ambulation/Gait Ambulation/Gait Assistance: 1: +1 Total assist Ambulation/Gait: Patient Percentage: 70% Ambulation Distance (Feet): 150 Feet Assistive device: 1 person hand held assist Ambulation/Gait Assistance Details: Pt with lateral lean to R requiring facilitation to sacrum to initiate weightshift to take steps forward. Pt did not attend to obstacles on her right side, pt required verbal cues and facilitation to sacrum to avoid obstacles. When facilitation was removed from pt's sacrum, pt immediately stopped ambulating. Gait Pattern: Decreased weight shift to left;Step-through pattern;Lateral trunk lean to right;Trunk flexed;Decreased step length - right Gait velocity: decreased    Exercises     PT Diagnosis: Difficulty walking;Abnormality of gait;Generalized weakness;Hemiplegia dominant side  PT Problem List: Decreased strength;Decreased range of motion;Decreased activity tolerance;Decreased balance;Decreased coordination;Decreased mobility;Decreased knowledge of use of DME;Decreased cognition;Decreased safety awareness;Impaired tone PT Treatment Interventions: DME instruction;Gait training;Functional mobility training;Stair training;Therapeutic activities;Therapeutic exercise;Balance training;Neuromuscular re-education;Cognitive  remediation   PT Goals Acute Rehab PT Goals PT Goal Formulation: With patient Time For Goal Achievement: 04/14/13 Potential to Achieve Goals: Good Pt will  go Supine/Side to Sit: with min assist;with cues (comment type and amount) PT Goal: Supine/Side to Sit - Progress: Goal set today Pt will go Sit to Stand: with min assist;with upper extremity assist PT Goal: Sit to Stand - Progress: Goal set today Pt will Transfer Bed to Chair/Chair to Bed: with mod assist PT Transfer Goal: Bed to Chair/Chair to Bed - Progress: Goal set today Pt will Ambulate: >150 feet;with modified independence;with least restrictive assistive device PT Goal: Ambulate - Progress: Goal set today Pt will Go Up / Down Stairs: 3-5 stairs;with min assist;with least restrictive assistive device PT Goal: Up/Down Stairs - Progress: Goal set today  Visit Information  Last PT Received On: 03/31/13 Assistance Needed: +2    Subjective Data  Subjective: Pt recieved in chair, eating lunch, agreeable to PT Patient Stated Goal: to go home   Prior Functioning  Home Living Lives With: Daughter (pt may live with husband at times) Available Help at Discharge: Available 24 hours/day (daughter works during the day, husband could stay) Type of Home: House Home Access: Stairs to enter Secretary/administrator of Steps: 5 Entrance Stairs-Rails: Left Home Layout: One level Bathroom Shower/Tub: Health visitor: Standard Home Adaptive Equipment: Hand-held shower hose;Built-in shower seat;Grab bars in shower;Walker - standard Prior Function Level of Independence: Independent Able to Take Stairs?: Yes Driving: Yes (pt drives when she needs to) Vocation: Retired Comments: Pt reports she helped clean up some but her daughter did most of it    Copywriter, advertising Arousal/Alertness: Awake/alert Behavior During Therapy: WFL for tasks assessed/performed Overall Cognitive Status: Impaired/Different from baseline Area  of Impairment: Following commands;Problem solving Following Commands: ;Follows one step commands with increased time (significant increase in time) Problem Solving: Slow processing;Difficulty sequencing;Requires verbal cues;Requires tactile cues;Decreased initiation General Comments: Pt with significantly delayed processing. Pt requires simple 1-step commands and increased time to complete tasks. Pt initiated standing and then stopped, requiring repeated verbal cues to continue. Pt responding more consistently to functional tasks (external focus) rather than motor commands    Extremity/Trunk Assessment Right Upper Extremity Assessment RUE ROM/Strength/Tone: Deficits RUE ROM/Strength/Tone Deficits: Pt with increased tone and R sided neglect making MMT inconclusive RUE Sensation: WFL - Light Touch RUE Coordination: Deficits RUE Coordination Deficits: Pt with difficulty initiating coordinated movement, pt requiring multiple attempts to reach for and touch a target Left Upper Extremity Assessment LUE ROM/Strength/Tone: Deficits LUE ROM/Strength/Tone Deficits: generalized weakness LUE Sensation: WFL - Light Touch LUE Coordination: WFL - gross/fine motor Right Lower Extremity Assessment RLE ROM/Strength/Tone: Deficits RLE ROM/Strength/Tone Deficits: Pt with increased extensor tone in R LE making knee flexion difficult. Pt with decreased awareness of R LE and R neglect making MMT inconclusive RLE Sensation: WFL - Light Touch RLE Coordination: Deficits RLE Coordination Deficits: Pt's ton making heel-shin slide difficult. After repeated flexion/extension, pt able to initiate the movement but unable to complete the activity Left Lower Extremity Assessment LLE ROM/Strength/Tone: Deficits LLE ROM/Strength/Tone Deficits: generalized weakness LLE Sensation: WFL - Light Touch LLE Coordination: WFL - gross/fine motor (able to complete heel-shin slide) Trunk Assessment Trunk Assessment: Other  exceptions Trunk Exceptions: Pt with R lateral lean with fatigue, able to recognize and correct with v/c's.    Balance    End of Session PT - End of Session Equipment Utilized During Treatment: Gait belt Activity Tolerance: Patient tolerated treatment well Patient left: in chair;with call bell/phone within reach Nurse Communication: Mobility status (delayed processing)  GP     03/31/2013, 2:56 PM Marvis Moeller, Consulting civil engineer  Physical Therapist Office #: [phone]  Agree with above assessment.  Lewis Shock, PT, DPT Pager #: [phone] Office #: [phone]

## 2013-03-31 NOTE — Progress Notes (Signed)
Patient ID: Meghan Yu, female   DOB: 11/24/1931, 79 y.o.   MRN: [id number] Doing well. Some swelling in the left face. Speech better. Some weakness of left leg. Continue pt/ot. A line and foley out

## 2013-03-31 NOTE — Plan of Care (Signed)
Problem: Consults Goal: Diagnosis - Craniotomy Tumor     

## 2013-03-31 NOTE — Consult Note (Signed)
Physical Medicine and Rehabilitation Consult  Reason for Consult: Right sided weakness with confusion and slurred speech Referring Physician: Dr. Isaac Laud   HPI: Meghan Yu is a 79 y.o. female with history of hypothyroid, OA; admitted on 03/26/13 with lethargy, two week history of progressive right sided weakness with right facial droop, difficulty walking, lethargy and apraxia. MRI brain done CT head revealed large left hemispheric frontal and posterior frontal extra-axial mass likely meningioma with significant vasogenic edema and 1 cm left to right shift. She was started on IV decadron. On 03/30/13, she underwent L-frontal craniotomy with gross total resection of tumor by Dr. Danielle Dess with improvement in RUE movement.  ABLA treated with 2 units PRBC. For PT/OT evaluations today. MD recommending CIR.   Patient is awake alert. No complaints.  Review of Systems  HENT: Negative for hearing loss.   Eyes: Positive for blurred vision. Negative for double vision.  Respiratory: Negative for cough and shortness of breath.   Cardiovascular: Negative for chest pain and palpitations.  Gastrointestinal:       Bowel urgency/dumping syndrome? Since GB surgery.   Genitourinary: Negative for urgency and frequency.  Musculoskeletal: Positive for joint pain (right knee pain-recent injection. ).  Neurological: Positive for focal weakness, weakness and headaches.  Psychiatric/Behavioral: Nervous/anxious: does not drive much due to anxiety.    Past Medical History  Diagnosis Date  . Hypothyroidism   . Hyperlipidemia   . Anxiety disorder   . Arthritis of knee, right     Needs replacement/Dr. Lajoyce Corners  . Colon polyps     colonoscopy by Dr. Laural Benes  . Degenerative arthritis of hand     bilateral   Past Surgical History  Procedure Laterality Date  . Knee arthroscopy    . Cholecystectomy    . Abdominal hysterectomy     Family History  Problem Relation Age of Onset  . Stroke Mother   . Cirrhosis Father    . Emphysema Sister    Social History:  Divorced. Homemaker. Lives alone--daughter stays with her in the winter. Daughter works but plans on being there at nights past discharge. Ex-husband and daughter in law can provide supervision past discharge. She reports that she has never smoked. She does not have any smokeless tobacco history on file. She reports that she does not drink alcohol or use illicit drugs.  Allergies: No Known Allergies  Medications Prior to Admission  Medication Sig Dispense Refill  . ALPRAZolam (XANAX) 1 MG tablet Take 1 mg by mouth 2 (two) times daily.      . calcium carbonate (OS-CAL) 600 MG TABS Take 600 mg by mouth 2 (two) times daily with a meal.      . Cholecalciferol (VITAMIN D) 2000 UNITS CAPS Take 1 capsule by mouth daily.      Marland Kitchen esomeprazole (NEXIUM) 40 MG capsule Take 40 mg by mouth daily before breakfast.      . estradiol (ESTRACE) 1 MG tablet Take 1 mg by mouth daily.      Marland Kitchen levothyroxine (SYNTHROID, LEVOTHROID) 75 MCG tablet Take 75 mcg by mouth daily before breakfast.      . magnesium chloride (SLOW-MAG) 64 MG TBEC Take 1 tablet by mouth daily.      . Multiple Vitamin (MULTIVITAMIN WITH MINERALS) TABS Take 1 tablet by mouth daily.      . Omega-3 Fatty Acids (FISH OIL) 1000 MG CAPS Take 1,000-2,000 mg by mouth 2 (two) times daily. Takes 2000 mg in the morning and 1000 mg in the evening      .  simvastatin (ZOCOR) 20 MG tablet Take 20 mg by mouth every evening.      . sulindac (CLINORIL) 200 MG tablet Take 200 mg by mouth daily.        Home:    Functional History:   Functional Status:  Mobility:          ADL:    Cognition: Cognition Orientation Level: Oriented X4    Blood pressure 144/49, pulse 66, temperature 98.1 F (36.7 C), temperature source Oral, resp. rate 15, height 5\' 1"  (1.549 m), weight 60.782 kg (134 lb), SpO2 95.00%.  Physical Exam  Nursing note and vitals reviewed. Constitutional: She is oriented to person, place, and time.  She appears well-developed and well-nourished.  HENT:  Bandage on head with dry blood on left. Boggy edema left temple area.   Eyes: Pupils are equal, round, and reactive to light.  Neck: Normal range of motion.  Cardiovascular: Normal rate and regular rhythm.   Pulmonary/Chest: Effort normal and breath sounds normal. No respiratory distress.  Abdominal: Soft. Bowel sounds are normal. She exhibits no distension. There is no tenderness.  Musculoskeletal: She exhibits edema (right knee).  Neurological: She is alert and oriented to person, place, and time.  Reports blurred vision. Flat affect, looks straight ahead but able to turn to left with cues. Denies  dizziness. No nystagmus noted.  Mild left facial weakness. No dysarthria. Delayed output with measured movements.  Able to follow basic commands. Oriented to self, place, situation, age/DOB. Needed cues for month and city. Decreased awareness of deficits. Right hemiparesis with spasticity LE>UE.  Skin: Skin is warm and dry.   motor strength 2 minus in the right deltoid, biceps, triceps, grip 3 minus the right hip flexors knee extensor 2 minus in ankle dorsiflexor plantar flexor 5/5 in the left deltoid, biceps, triceps, grip, hip flexor, knee extensors, ankle dorsiflexor plantar flexor Sensation reduced to light touch in the right upper and right lower extremity normal in the left upper and left lower extremity  Results for orders placed during the hospital encounter of 03/26/13 (from the past 24 hour(s))  POCT I-STAT 7, (LYTES, BLD GAS, ICA,H+H)     Status: Abnormal   Collection Time    03/30/13  9:22 AM      Result Value Range   pH, Arterial 7.351  7.350 - 7.450   pCO2 arterial 36.4  35.0 - 45.0 mmHg   pO2, Arterial 181.0 (*) 80.0 - 100.0 mmHg   Bicarbonate 20.4  20.0 - 24.0 mEq/L   TCO2 22  0 - 100 mmol/L   O2 Saturation 100.0     Acid-base deficit 5.0 (*) 0.0 - 2.0 mmol/L   Sodium 139  135 - 145 mEq/L   Potassium 3.7  3.5 - 5.1 mEq/L    Calcium, Ion 1.17  1.13 - 1.30 mmol/L   HCT 31.0 (*) 36.0 - 46.0 %   Hemoglobin 10.5 (*) 12.0 - 15.0 g/dL   Patient temperature 16.1 C     Sample type ARTERIAL    POCT I-STAT 4, (NA,K, GLUC, HGB,HCT)     Status: Abnormal   Collection Time    03/30/13 10:19 AM      Result Value Range   Sodium 141  135 - 145 mEq/L   Potassium 3.9  3.5 - 5.1 mEq/L   Glucose, Bld 213 (*) 70 - 99 mg/dL   HCT 09.6 (*) 04.5 - 40.9 %   Hemoglobin 10.2 (*) 12.0 - 15.0 g/dL  POCT I-STAT 4, (NA,K,  GLUC, HGB,HCT)     Status: Abnormal   Collection Time    03/30/13 11:41 AM      Result Value Range   Sodium 143  135 - 145 mEq/L   Potassium 3.9  3.5 - 5.1 mEq/L   Glucose, Bld 220 (*) 70 - 99 mg/dL   HCT 16.1 (*) 09.6 - 04.5 %   Hemoglobin 8.8 (*) 12.0 - 15.0 g/dL  GLUCOSE, CAPILLARY     Status: Abnormal   Collection Time    03/30/13  5:20 PM      Result Value Range   Glucose-Capillary 195 (*) 70 - 99 mg/dL  GLUCOSE, CAPILLARY     Status: Abnormal   Collection Time    03/30/13 10:07 PM      Result Value Range   Glucose-Capillary 198 (*) 70 - 99 mg/dL   Comment 1 Notify RN    BASIC METABOLIC PANEL     Status: Abnormal   Collection Time    03/31/13  5:45 AM      Result Value Range   Sodium 139  135 - 145 mEq/L   Potassium 3.6  3.5 - 5.1 mEq/L   Chloride 107  96 - 112 mEq/L   CO2 24  19 - 32 mEq/L   Glucose, Bld 159 (*) 70 - 99 mg/dL   BUN 20  6 - 23 mg/dL   Creatinine, Ser 4.09  0.50 - 1.10 mg/dL   Calcium 7.6 (*) 8.4 - 10.5 mg/dL   GFR calc non Af Amer 84 (*) >90 mL/min   GFR calc Af Amer >90  >90 mL/min  CBC     Status: Abnormal   Collection Time    03/31/13  5:45 AM      Result Value Range   WBC 10.4  4.0 - 10.5 K/uL   RBC 3.79 (*) 3.87 - 5.11 MIL/uL   Hemoglobin 11.3 (*) 12.0 - 15.0 g/dL   HCT 81.1 (*) 91.4 - 78.2 %   MCV 85.2  78.0 - 100.0 fL   MCH 29.8  26.0 - 34.0 pg   MCHC 35.0  30.0 - 36.0 g/dL   RDW 95.6  21.3 - 08.6 %   Platelets 161  150 - 400 K/uL  GLUCOSE, CAPILLARY      Status: Abnormal   Collection Time    03/31/13  7:37 AM      Result Value Range   Glucose-Capillary 142 (*) 70 - 99 mg/dL   Ct Head Wo Contrast  03/30/2013   *RADIOLOGY REPORT*  Clinical Data: Follow-up left frontal craniotomy.  CT HEAD WITHOUT CONTRAST  Technique:  Contiguous axial images were obtained from the base of the skull through the vertex without contrast.  Comparison: MRI brain 03/26/2013.  CT head 03/26/2013.  Findings: Since the previous study, there are interval postoperative changes with left frontal craniotomy and resection of the previously identified extra-axial mass.  There is pneumocephalus consistent with postoperative change.  There is residual low attenuation change consistent with edema in the cortex and deep white matter in the left frontal and parietal region. This appears generally stable since the previous study.  Mild hyperintensity along the left side of the anterior falx probably representing focal postoperative hemorrhage.  There is improved mass effect with decreased midline shift, now measuring about 4 mm. Ventricles are not dilated.  Mild effacement of the left anterior horn of the lateral ventricle.  Focal calcification in the right temporal region consistent with small calcified meningioma, measuring about  11 mm diameter, stable since previous study.  IMPRESSION: Postoperative changes in the left frontal region as described. Residual edema in the left frontal parietal region.  4 mm left to right midline shift represents significant improvement.   Original Report Authenticated By: Burman Nieves, M.D.    Assessment/Plan: Diagnosis: Right spastic hemiparesis secondary to left frontoparietal meningioma status post resection postoperative day #1 1. Does the need for close, 24 hr/day medical supervision in concert with the patient's rehab needs make it unreasonable for this patient to be served in a less intensive setting? Yes 2. Co-Morbidities requiring  supervision/potential complications: Pain control, postoperative  wound care 3. Due to bladder management, bowel management, safety, skin/wound care, disease management, medication administration, pain management and patient education, does the patient require 24 hr/day rehab nursing? Yes 4. Does the patient require coordinated care of a physician, rehab nurse, PT (1-2 hrs/day, 5 days/week), OT (1-2 hrs/day, 5 days/week) and SLP (0.5-1 hrs/day, 5 days/week) to address physical and functional deficits in the context of the above medical diagnosis(es)? Yes Addressing deficits in the following areas: balance, endurance, locomotion, strength, transferring, bowel/bladder control, bathing, dressing, feeding, grooming, toileting and speech 5. Can the patient actively participate in an intensive therapy program of at least 3 hrs of therapy per day at least 5 days per week? Yes 6. The potential for patient to make measurable gains while on inpatient rehab is good 7. Anticipated functional outcomes upon discharge from inpatient rehab are supervision min assist mobility with PT, supervision min assist ADLs with OT, 100% orientation and speech intelligibility with SLP. 8. Estimated rehab length of stay to reach the above functional goals is: 2 weeks 9. Does the patient have adequate social supports to accommodate these discharge functional goals? Potentially 10. Anticipated D/C setting: Home 11. Anticipated post D/C treatments: HH therapy 12. Overall Rehab/Functional Prognosis: good  RECOMMENDATIONS: This patient's condition is appropriate for continued rehabilitative care in the following setting: CIR Patient has agreed to participate in recommended program. Yes Note that insurance prior authorization may be required for reimbursement for recommended care.  Comment: Should be ready on postoperative day 2 or 3 if she is tolerating therapy    03/31/2013

## 2013-03-31 NOTE — Progress Notes (Signed)
UR completed 

## 2013-03-31 NOTE — Clinical Social Work Note (Signed)
Clinical Social Worker received standing order referral for possible ST-SNF placement.  Chart reviewed.  PT/OT currently working with patient and recommending inpatient rehab at discharge.  Spoke with inpatient rehab admissions coordinator who plans to follow up with patient regarding potential placement.  Patient insurance will require prior authorization.  CSW will continue to follow for possible SNF needs at discharge.  Macario Golds, [place] [phone]

## 2013-04-01 ENCOUNTER — Encounter (HOSPITAL_COMMUNITY): Payer: Self-pay | Admitting: Neurological Surgery

## 2013-04-01 LAB — GLUCOSE, CAPILLARY: Glucose-Capillary: 137 mg/dL — ABNORMAL HIGH (ref 70–99)

## 2013-04-01 LAB — CBC
MCV: 84.3 fL (ref 78.0–100.0)
Platelets: 141 10*3/uL — ABNORMAL LOW (ref 150–400)
RBC: 3.62 MIL/uL — ABNORMAL LOW (ref 3.87–5.11)
WBC: 9.9 10*3/uL (ref 4.0–10.5)

## 2013-04-01 MED ORDER — LEVETIRACETAM 500 MG PO TABS: 500.0000 mg | ORAL_TABLET | Freq: Two times a day (BID) | ORAL | Status: DC

## 2013-04-01 MED ORDER — SENNOSIDES-DOCUSATE SODIUM 8.6-50 MG PO TABS
1.0000 | ORAL_TABLET | Freq: Every day | ORAL | Status: DC
  Filled 2013-04-01: qty 1

## 2013-04-01 MED ORDER — ALPRAZOLAM 1 MG PO TABS: 1.0000 mg | ORAL_TABLET | Freq: Two times a day (BID) | ORAL | Status: DC | PRN

## 2013-04-01 MED ORDER — INSULIN ASPART 100 UNIT/ML ~~LOC~~ SOLN: 0.0000 [IU] | Freq: Three times a day (TID) | SUBCUTANEOUS | Status: DC

## 2013-04-01 MED ORDER — DEXAMETHASONE 2 MG PO TABS
2.0000 mg | ORAL_TABLET | Freq: Three times a day (TID) | ORAL | Status: DC
  Administered 2013-04-01 – 2013-04-02 (×3): 2 mg via ORAL
  Filled 2013-04-01 (×5): qty 1

## 2013-04-01 MED ORDER — DEXAMETHASONE 2 MG PO TABS: 2.0000 mg | ORAL_TABLET | Freq: Three times a day (TID) | ORAL | Status: DC

## 2013-04-01 MED ORDER — HYDROCODONE-ACETAMINOPHEN 5-325 MG PO TABS: 30.0000 | ORAL_TABLET | ORAL | Status: DC | PRN

## 2013-04-01 MED ORDER — SODIUM CHLORIDE 0.9 % IJ SOLN
3.0000 mL | Freq: Two times a day (BID) | INTRAMUSCULAR | Status: DC
Start: 2013-04-01 — End: 2013-04-02
  Administered 2013-04-01 – 2013-04-02 (×2): 3 mL via INTRAVENOUS

## 2013-04-01 MED ORDER — MAGNESIUM CHLORIDE 64 MG PO TBEC
1.0000 | DELAYED_RELEASE_TABLET | Freq: Every day | ORAL | Status: DC
  Administered 2013-04-02: 64 mg via ORAL
  Filled 2013-04-01: qty 1

## 2013-04-01 MED ORDER — SENNOSIDES-DOCUSATE SODIUM 8.6-50 MG PO TABS: 1.0000 | ORAL_TABLET | Freq: Every day | ORAL | Status: DC

## 2013-04-01 MED ORDER — POLYETHYLENE GLYCOL 3350 17 G PO PACK
17.0000 g | PACK | Freq: Every day | ORAL | Status: DC
  Administered 2013-04-01: 17 g via ORAL
  Filled 2013-04-01: qty 1

## 2013-04-01 MED ORDER — SIMVASTATIN 20 MG PO TABS
20.0000 mg | ORAL_TABLET | Freq: Every day | ORAL | Status: DC
  Administered 2013-04-01: 20 mg via ORAL
  Filled 2013-04-01 (×2): qty 1

## 2013-04-01 MED ORDER — POLYETHYLENE GLYCOL 3350 17 G PO PACK: 17.0000 g | PACK | Freq: Every day | ORAL | Status: DC

## 2013-04-01 MED ORDER — POLYETHYLENE GLYCOL 3350 17 G PO PACK
17.0000 g | PACK | Freq: Every day | ORAL | Status: DC
  Filled 2013-04-01: qty 1

## 2013-04-01 MED ORDER — LEVOTHYROXINE SODIUM 75 MCG PO TABS
75.0000 ug | ORAL_TABLET | Freq: Every day | ORAL | Status: DC
  Administered 2013-04-02: 75 ug via ORAL
  Filled 2013-04-01 (×2): qty 1

## 2013-04-01 MED ORDER — INSULIN ASPART 100 UNIT/ML ~~LOC~~ SOLN
0.0000 [IU] | Freq: Three times a day (TID) | SUBCUTANEOUS | Status: DC
  Administered 2013-04-01: 2 [IU] via SUBCUTANEOUS
  Administered 2013-04-02: 1 [IU] via SUBCUTANEOUS

## 2013-04-01 MED ORDER — LEVETIRACETAM 500 MG PO TABS
500.0000 mg | ORAL_TABLET | Freq: Two times a day (BID) | ORAL | Status: DC
  Administered 2013-04-01 – 2013-04-02 (×2): 500 mg via ORAL
  Filled 2013-04-01 (×3): qty 1

## 2013-04-01 MED ORDER — SENNOSIDES-DOCUSATE SODIUM 8.6-50 MG PO TABS
1.0000 | ORAL_TABLET | Freq: Every day | ORAL | Status: DC
  Administered 2013-04-01: 1 via ORAL
  Filled 2013-04-01 (×2): qty 1

## 2013-04-01 MED ORDER — ONDANSETRON HCL 4 MG PO TABS: 4.0000 mg | ORAL_TABLET | ORAL | Status: DC | PRN

## 2013-04-01 NOTE — Progress Notes (Addendum)
Met with patient and her daughter at bedside to evaluate for and discuss possible CIR admission. Pt would benefit from inpatient rehab prior to returning home and they are in agreement with plan. Pt lives with her husband in [place] and stays with daughter in [place] at times for a long visit. Pt was at her daughter's here when she fell. D/C plan is for pt's husband to come to stay at their daughter's home and provide 24/7 supervision/assistance after d/c.  CIR bed not available at this time, possibly later today. Dr Donette Larry reports pt is ready for d/c to CIR. Will keep team informed. For questions, call [phone].

## 2013-04-01 NOTE — Progress Notes (Signed)
Physical Therapy Treatment Patient Details Name: Meghan Yu MRN: [id number] DOB: Jan 06, 1932 Today's Date: 04/01/2013 Time: 4098-1191 PT Time Calculation (min): 29 min  PT Assessment / Plan / Recommendation Comments on Treatment Session  Patient progressing with mobility, but fatigued working at the end of the day.  Felt she had been up multiple times today and fatigued quickly with mobility and transfer training.  Right side weakness and decreased attention limiting independence and safety.  Will benefit from CIR level therapies prior to d/c home.    Follow Up Recommendations  CIR     Does the patient have the potential to tolerate intense rehabilitation   Yes  Barriers to Discharge  None      Equipment Recommendations  Other (comment) (TBA)    Recommendations for Other Services  None  Frequency Min 4X/week   Plan Discharge plan remains appropriate    Precautions / Restrictions Precautions Precautions: Fall   Pertinent Vitals/Pain No pain complaints; HR max 123, SpO2 lowest 88% on room air    Mobility  Bed Mobility Bed Mobility: Sit to Supine;Sitting - Scoot to Edge of Bed;Supine to Sit Supine to Sit: 2: Max assist Sitting - Scoot to [place] of Bed: 3: Mod assist Sit to Supine: 3: Mod assist Details for Bed Mobility Assistance: assisted legs to edge of bed and to lift trunk, unable to sequence to scoot to edge of bed unassisted, to supine assisted feet in bed Transfers Sit to Stand: 3: Mod assist;With upper extremity assist;From bed;From chair/3-in-1 Stand to Sit: 4: Min assist;With upper extremity assist;To bed;To chair/3-in-1 Details for Transfer Assistance: More automatic with transitions today, limited by right LE weakness and fatigue Ambulation/Gait Ambulation/Gait Assistance: 3: Mod assist Ambulation Distance (Feet): 200 Feet Assistive device: 1 person hand held assist Ambulation/Gait Assistance Details: loss of balance on turns and if looking to left, demonstrates  right hip retraction and keeps right foot turned out, geve hand held assist on right side to allow increasd attention to this side. Gait Pattern: Step-through pattern;Trunk rotated posteriorly on right;Decreased stride length;Decreased hip/knee flexion - right    Exercises Other Exercises Other Exercises: performed sit<>stand x 3 then stopped due to HR up to 123 and SpO2 88%    PT Goals Acute Rehab PT Goals Pt will go Supine/Side to Sit: with min assist;with cues (comment type and amount) PT Goal: Supine/Side to Sit - Progress: Progressing toward goal Pt will go Sit to Stand: with min assist;with upper extremity assist PT Goal: Sit to Stand - Progress: Progressing toward goal Pt will Ambulate: >150 feet;with modified independence;with least restrictive assistive device PT Goal: Ambulate - Progress: Progressing toward goal  Visit Information  Last PT Received On: 04/01/13 Assistance Needed: +1    Subjective Data  Subjective: Just was up to bedside commode with daughter assist   Cognition  Cognition Arousal/Alertness: Awake/alert Behavior During Therapy: Flat affect Overall Cognitive Status: Impaired/Different from baseline Following Commands: Follows one step commands with increased time Problem Solving: Slow processing    Balance  Balance Balance Assessed: Yes Static Sitting Balance Static Sitting - Balance Support: Feet supported;No upper extremity supported Static Sitting - Level of Assistance: 5: Stand by assistance  End of Session PT - End of Session Equipment Utilized During Treatment: Gait belt Activity Tolerance: Patient limited by fatigue Patient left: in bed;with call bell/phone within reach   GP     [facility] 04/01/2013, 4:25 PM [place], [place] [phone] 04/01/2013

## 2013-04-01 NOTE — Progress Notes (Addendum)
CIR bed is not available for today. Will f/u in AM and anticipate admission to CIR tomorrow or Saturday. Roderic Palau, RN Admission Coordinator will be following pt tomorrow. Pt's RN and CSW aware. [phone])

## 2013-04-01 NOTE — Evaluation (Signed)
Speech Language Pathology Evaluation Patient Details Name: Meghan Yu MRN: [id number] DOB: 04-15-1932 Today's Date: 04/01/2013 Time: 6213-0865 SLP Time Calculation (min): 22 min  Problem List:  Patient Active Problem List   Diagnosis Date Noted  . Brain tumor 03/26/2013  . Hypothyroidism 03/26/2013  . Hyperlipidemia 03/26/2013   Past Medical History:  Past Medical History  Diagnosis Date  . Hypothyroidism   . Hyperlipidemia   . Anxiety disorder   . Arthritis of knee, right     Needs replacement/Dr. Lajoyce Corners  . Colon polyps     colonoscopy by Dr. Laural Benes  . Degenerative arthritis of hand     bilateral   Past Surgical History:  Past Surgical History  Procedure Laterality Date  . Knee arthroscopy    . Cholecystectomy    . Abdominal hysterectomy     HPI:  79 years old female with PMH relevant for hypothyroidism and dyslipidemia. Admitted with right hemiparesis and lethargy. Brain imaging showed a large left frontotemporal mass with vasogenic edema and midline shift. Meghan Yu. underwent left frontal craniotomy and gross total resection of meningioma 6/10.   Assessment / Plan / Recommendation Clinical Impression  Meghan Yu. seen for speech-language-cognitive assessment.  Basic comprehension and expression, orientation, pragmatics was functional for verbal items assessed.  She exhibited delayed processing, mild right inattention while reading, decreased emergent and anticipatory awareness.  Daughter stated Meghan Yu. reminded daughter to pay bills, give her dog water so she is aware of ADL's at home.  Mild-moderate verbal cues needed to initiate activities.  Meghan Yu. will benefit from continued ST to facilitate moderate to higher level information and increase performance with least level of assist required.  Good inpatient rehab candidate.     SLP Assessment  Patient needs continued Speech Lanaguage Pathology Services    Follow Up Recommendations  Inpatient Rehab    Frequency and Duration min 2x/week   2 weeks   Pertinent Vitals/Pain none   SLP Goals  SLP Goals Potential to Achieve Goals: Good SLP Goal #1: Meghan Yu. will increase attention to items on right side of text, environment with min cues for strategies. SLP Goal #2: Meghan Yu. will demonstrate emergent awareness to deficits with min verbal cues. SLP Goal #3: Meghan Yu. will initiate activities with min verbal cues.  SLP Evaluation Prior Functioning  Cognitive/Linguistic Baseline: Within functional limits Type of Home: House Available Help at Discharge: Available 24 hours/day Vocation: Retired   IT consultant  Overall Cognitive Status: Impaired/Different from baseline Arousal/Alertness: Awake/alert Orientation Level: Oriented to person;Disoriented to place (reason for surgery needed cues, cue for 1/4 temporal orienta) Attention: Sustained;Selective Sustained Attention: Appears intact Selective Attention: Impaired Selective Attention Impairment: Verbal basic;Functional basic Memory: Impaired Memory Impairment: Prospective memory;Decreased recall of new information Awareness: Impaired Awareness Impairment: Anticipatory impairment Problem Solving:  (to be assessed further) Executive Function: Organizing;Decision Making;Self Monitoring;Self Correcting Safety/Judgment:  (TBD)    Comprehension  Auditory Comprehension Overall Auditory Comprehension: Appears within functional limits for tasks assessed Yes/No Questions: Within Functional Limits Commands: Within Functional Limits (one step) Conversation: Simple Interfering Components: Attention;Processing speed Visual Recognition/Discrimination Discrimination: Not tested Reading Comprehension Reading Status: Impaired Functional Environmental (signs, name badge): Impaired    Expression Expression Primary Mode of Expression: Verbal Verbal Expression Overall Verbal Expression: Appears within functional limits for tasks assessed Initiation: No impairment Level of Generative/Spontaneous  Verbalization: Sentence Naming: Not tested Pragmatics: No impairment Written Expression Dominant Hand: Right Written Expression: Not tested   Oral / Motor Oral Motor/Sensory Function Overall Oral Motor/Sensory Function: Impaired Labial ROM: Reduced right Labial Symmetry:  Abnormal symmetry right Labial Strength: Reduced Labial Sensation: Reduced Motor Speech Overall Motor Speech: Appears within functional limits for tasks assessed Respiration: Within functional limits Phonation: Low vocal intensity Resonance: Within functional limits Articulation: Within functional limitis Intelligibility: Intelligible Motor Planning: Witnin functional limits   GO     Breck Coons [employer] [phone]  04/01/2013

## 2013-04-01 NOTE — Discharge Summary (Signed)
Physician Discharge Summary  Patient ID: Meghan Yu MRN: [id number] DOB/AGE: 06-03-1932 79 y.o.  Admit date: 03/26/2013 Discharge date: 04/01/2013  Admission Diagnoses:  Discharge Diagnoses:  Principal Problem:   Brain tumor-status post resection of meningioma Elevated blood sugar because of the steroids-no history of diabetes Hypertension- due to steroids Postop anemia Active Problems:   Hypothyroidism   Hyperlipidemia Anxiety GERD  Discharged Condition: good  Hospital Course: 79 years old female admitted with right-sided weakness and slurred speech underlying history of hypothyroid and dyslipidemia Found to have a brain mass on the CT scan with left shift and edema Problem #1: brain tumor patient had a neurosurgical consultation she had an MRI suggestive of large meningioma with a left sided shift and brain edema he was started on IV Decadron- her symptoms of right-sided weakness and speech improved; she had brain tumor resection; found to have a meningioma with improvement in her symptoms She continues to have some weakness on the right leg worse than the right arm. Postop care and neurosurgery followup Continue Decadron as per the neurosurgery- may need to taper off Currently on Keppra for seizure prophylaxis Followup with neurosurgery Problem #2: elevated blood pressures patient had elevated blood pressure requiring IV blood pressure medications during the hospitalization thought to be secondary to the brain tumor as well as Decadron; current blood pressures in the 150s to 160 systolic- continue monitoring blood pressure if needed may need to start on p.o. Medication Elevated blood sugars no history of diabetes required some sliding scale insulin blood sugars are in the 130s to 150s; may need some sugar monitoring while on Decadron. No need for oral agent at this point Anemia. Hemoglobin was 8.8 receive 2 units of packed red blood cells; hemoglobin stabilized at 10.8 History of  hypothyroid continue Synthroid clinically euthyroid History of dyslipidemia continue Zocor Mild anxiety disorder Xanax p.r.n. Constipation continue on Senokot and MiraLax started. Physical therapy occupational therapy and speech Neurosurgery followup  Consults: rehabilitation medicine and neurosurgery  Significant Diagnostic Studies: labs: [place] today to white count of 9.9 hemoglobin 10.8 sodium 139 progression 3.6 creatinine of 0.6 glucose 159, calcium 7.6 and radiology: CXR: normal, MRI: last brain tumor on the left frontal parietal area, meningioma and CT scan: brain tumor, subsequent CT after resection improved  Treatments: IV hydration, analgesia: Morphine and steroids:Decadron Discharge Exam: Blood pressure 154/61, pulse 78, temperature 98 F (36.7 C), temperature source Oral, resp. rate 20, height 5\' 1"  (1.549 m), weight 60.782 kg (134 lb), SpO2 95.00%. General appearance: alert Resp: clear to auscultation bilaterally Cardio: regular rate and rhythm GI: soft, non-tender; bowel sounds normal; no masses,  no organomegaly Neurologic: Motor: right leg weakness 2/5;right arm3/5  Disposition: inpatient rehabilitation  Discharge Orders   Future Orders Complete By Expires     Diet - low sodium heart healthy  As directed     Discharge instructions  As directed     Comments:      Monitor blood pressure and blood sugars Follow up with neurosurgery Wound care- as per neurosurgery    Increase activity slowly  As directed         Medication List    STOP taking these medications       ESTRACE 1 MG tablet  Generic drug:  estradiol     sulindac 200 MG tablet  Commonly known as:  CLINORIL      TAKE these medications       ALPRAZolam 1 MG tablet  Commonly known as:  Prudy Feeler  Take 1 tablet (1 mg total) by mouth 2 (two) times daily between meals as needed for sleep.     calcium carbonate 600 MG Tabs  Commonly known as:  OS-CAL  Take 600 mg by mouth 2 (two) times daily with a  meal.     dexamethasone 2 MG tablet  Commonly known as:  DECADRON  Take 1 tablet (2 mg total) by mouth every 8 (eight) hours.     esomeprazole 40 MG capsule  Commonly known as:  NEXIUM  Take 40 mg by mouth daily before breakfast.     Fish Oil 1000 MG Caps  Take 1,000-2,000 mg by mouth 2 (two) times daily. Takes 2000 mg in the morning and 1000 mg in the evening     HYDROcodone-acetaminophen 5-325 MG per tablet  Commonly known as:  NORCO/VICODIN  Take 30 tablets by mouth every 4 (four) hours as needed.     insulin aspart 100 UNIT/ML injection  Commonly known as:  novoLOG  Inject 0-9 Units into the skin 3 (three) times daily with meals.     levETIRAcetam 500 MG tablet  Commonly known as:  KEPPRA  Take 1 tablet (500 mg total) by mouth 2 (two) times daily.     levothyroxine 75 MCG tablet  Commonly known as:  SYNTHROID, LEVOTHROID  Take 75 mcg by mouth daily before breakfast.     magnesium chloride 64 MG Tbec  Commonly known as:  SLOW-MAG  Take 1 tablet by mouth daily.     multivitamin with minerals Tabs  Take 1 tablet by mouth daily.     ondansetron 4 MG tablet  Commonly known as:  ZOFRAN  Take 1 tablet (4 mg total) by mouth every 4 (four) hours as needed.     polyethylene glycol packet  Commonly known as:  MIRALAX / GLYCOLAX  Take 17 g by mouth daily.     senna-docusate 8.6-50 MG per tablet  Commonly known as:  Senokot-S  Take 1 tablet by mouth at bedtime.     simvastatin 20 MG tablet  Commonly known as:  ZOCOR  Take 20 mg by mouth every evening.     Vitamin D 2000 UNITS Caps  Take 1 capsule by mouth daily.       discharge planning time total 45 minutes  Signed: Lus Kriegel 04/01/2013, 12:39 PM

## 2013-04-01 NOTE — Progress Notes (Signed)
Saw her earlier this am. Some weakness of right leg. Less swelling of face . To floor in am

## 2013-04-01 NOTE — Clinical Social Work Note (Signed)
Clinical Social Worker will sign off for now as social work intervention is no longer needed.  Patient has been accepted by inpatient rehab and should have a bed available late today or hopefully tomorrow.   Please consult [place] again if new need arises.  Macario Golds, [place] [phone]

## 2013-04-01 NOTE — PMR Pre-admission (Signed)
PMR Admission Coordinator Pre-Admission Assessment  Patient: Meghan Yu is an 79 y.o., female MRN: [id number] DOB: Dec 10, 1931 Height: 5\' 1"  (154.9 cm) Weight: 60.782 kg (134 lb)              Insurance Information Primary: Medicare Policy#: [id number] Subscriber: Patient Benefits: [place] Effective Date: 11/21/1998 Deductible: $1216 OOP Max: None Life Max: Unlimited CIR: 100% SNF: 100 days LBD:  Outpatient: 80% Copay: 20%  Home Health: 100% DME: 80%  Copay: 20% Providers: Patient's choice  SECONDARY:[employer]      Policy#: [id number]      Subscriber: self  Also [facility] insurance for medication Policy #[id number]  Emergency Contact Information Contact Information   Name Relation Home Work Mobile   [place] Husband [phone]  [phone]   Smith,Varetta Daughter [phone]       Current Medical History  Patient Admitting Diagnosis: Right spastic hemiparesis secondary to left frontoparietal meningioma status post resection   History of Present Illness: 79 y.o. female with history of hypothyroid, OA; admitted on 03/26/13 with lethargy, two week history of progressive right sided weakness with right facial droop, difficulty walking, lethargy and apraxia. MRI brain done CT head revealed large left hemispheric frontal and posterior frontal extra-axial mass likely meningioma with significant vasogenic edema and 1 cm left to right shift. She was started on IV decadron. On 03/30/13, she underwent L-frontal craniotomy with gross total resection of tumor by Dr. Danielle Dess with improvement in RUE movement. ABLA treated with 2 units PRBC.   Past Medical History  Past Medical History  Diagnosis Date  . Hypothyroidism   . Hyperlipidemia   . Anxiety disorder   . Arthritis of knee, right     Needs replacement/Dr. Lajoyce Corners  . Colon polyps     colonoscopy by Dr. Laural Benes  . Degenerative arthritis of hand     bilateral   Family History  family history includes Cirrhosis in her father;  Emphysema in her sister; and Stroke in her mother.  Prior Rehab/Hospitalizations: none  Current Medications  Current facility-administered medications:dexamethasone (DECADRON) tablet 2 mg, 2 mg, Oral, TID, Georgann Housekeeper, MD, 2 mg at 04/01/13 2208;  insulin aspart (novoLOG) injection 0-9 Units, 0-9 Units, Subcutaneous, TID WC, Georgann Housekeeper, MD, 2 Units at 04/01/13 1702;  levETIRAcetam (KEPPRA) tablet 500 mg, 500 mg, Oral, BID, Georgann Housekeeper, MD, 500 mg at 04/01/13 2208 levothyroxine (SYNTHROID, LEVOTHROID) tablet 75 mcg, 75 mcg, Oral, QAC breakfast, Georgann Housekeeper, MD, 75 mcg at 04/02/13 4259;  magnesium chloride (SLOW-MAG) 64 MG SR tablet 64 mg, 1 tablet, Oral, Daily, Georgann Housekeeper, MD;  polyethylene glycol (MIRALAX / GLYCOLAX) packet 17 g, 17 g, Oral, Daily, Georgann Housekeeper, MD;  senna-docusate (Senokot-S) tablet 1 tablet, 1 tablet, Oral, QHS, Georgann Housekeeper, MD, 1 tablet at 04/01/13 2209 simvastatin (ZOCOR) tablet 20 mg, 20 mg, Oral, Q supper, Georgann Housekeeper, MD, 20 mg at 04/01/13 1701;  sodium chloride 0.9 % injection 3 mL, 3 mL, Intravenous, Q12H, Georgann Housekeeper, MD, 3 mL at 04/01/13 2209  Patients Current Diet: Carb Control  Precautions / Restrictions Precautions Precautions: Fall Restrictions Weight Bearing Restrictions: No   Prior Activity Level Community (5-7x/wk): Active daily  Home Assistive Devices / Equipment Home Assistive Devices/Equipment: Environmental consultant (specify type) (occasionally, doesn't like to use it) Home Adaptive Equipment: Hand-held shower hose;Built-in shower seat;Grab bars in shower;Walker - standard  Prior Functional Level Prior Function Level of Independence: Independent Able to Take Stairs?: Yes Driving: Yes (pt drives when she needs to) Vocation: Retired Comments: Pt reports she  helped clean up some but her daughter did most of it  Current Functional Level Cognition  Arousal/Alertness: Awake/alert Overall Cognitive Status: Impaired/Different from  baseline Orientation Level: Oriented X4 Following Commands: Follows one step commands with increased time General Comments: Pt with significantly delayed processing. Pt requires simple 1-step commands and increased time to complete tasks. Pt initiated standing and then stopped, requiring repeated verbal cues to continue. Pt responding more consistently to functional tasks (external focus) rather than motor commands Attention: Sustained;Selective Sustained Attention: Appears intact Selective Attention: Impaired Selective Attention Impairment: Verbal basic;Functional basic Memory: Impaired Memory Impairment: Prospective memory;Decreased recall of new information Awareness: Impaired Awareness Impairment: Anticipatory impairment Problem Solving:  (to be assessed further) Executive Function: Organizing;Decision Making;Self Monitoring;Self Correcting Safety/Judgment:  (TBD)    Extremity Assessment (includes Sensation/Coordination)  RUE ROM/Strength/Tone: Deficits RUE ROM/Strength/Tone Deficits: Pt with increased tone and R sided neglect making MMT inconclusive RUE Sensation: WFL - Light Touch RUE Coordination: Deficits RUE Coordination Deficits: Pt with difficulty initiating coordinated movement, pt requiring multiple attempts to reach for and touch a target  RLE ROM/Strength/Tone: Deficits RLE ROM/Strength/Tone Deficits: Pt with increased extensor tone in R LE making knee flexion difficult. Pt with decreased awareness of R LE and R neglect making MMT inconclusive RLE Sensation: WFL - Light Touch RLE Coordination: Deficits RLE Coordination Deficits: Pt's ton making heel-shin slide difficult. After repeated flexion/extension, pt able to initiate the movement but unable to complete the activity    ADLs       Mobility  Bed Mobility: Sit to Supine;Sitting - Scoot to Edge of Bed;Supine to Sit Supine to Sit: 2: Max assist Sitting - Scoot to [place] of Bed: 3: Mod assist Sit to Supine: 3: Mod  assist    Transfers  Transfers: Sit to Stand;Stand to Sit Sit to Stand: 3: Mod assist;With upper extremity assist;From bed;From chair/3-in-1 Sit to Stand: Patient Percentage: 60% Stand to Sit: 4: Min assist;With upper extremity assist;To bed;To chair/3-in-1 Stand to Sit: Patient Percentage: 70%    Ambulation / Gait / Stairs / Wheelchair Mobility  Ambulation/Gait Ambulation/Gait Assistance: 3: Mod assist Ambulation/Gait: Patient Percentage: 70% Ambulation Distance (Feet): 200 Feet Assistive device: 1 person hand held assist Ambulation/Gait Assistance Details: loss of balance on turns and if looking to left, demonstrates right hip retraction and keeps right foot turned out, geve hand held assist on right side to allow increasd attention to this side. Gait Pattern: Step-through pattern;Trunk rotated posteriorly on right;Decreased stride length;Decreased hip/knee flexion - right Gait velocity: decreased    Posture / Balance Static Sitting Balance Static Sitting - Balance Support: Feet supported;No upper extremity supported Static Sitting - Level of Assistance: 5: Stand by assistance    Special needs/care consideration Continuous Drip IV: yes Skin: Crani incision with gauze dressing Bowel mgmt: LBM: 6/9 Bladder mgmt: WDL Diabetic mgmt: Low carb diet and monitoring CBGs d/t Decadron    Previous Home Environment Living Arrangements: Spouse/significant other;Children Lives With: Daughter (pt may live with husband at times) Available Help at Discharge: Available 24 hours/day Type of Home: House Home Layout: One level Home Access: Stairs to enter Entrance Stairs-Rails: Left Entrance Stairs-Number of Steps: 5 Bathroom Shower/Tub: Health visitor: Standard Home Care Services: No  Discharge Living Setting Plans for Discharge Living Setting: Patient to stay at daughter's home. Type of Home at Discharge: House Discharge Home Layout: One level Discharge Home Access:  Stairs to enter Entrance Stairs-Rails: Left Entrance Stairs-Number of Steps: 5 Discharge Bathroom Shower/Tub: Walk-in shower Discharge Bathroom Toilet: Standard  Discharge Bathroom Accessibility: Yes How Accessible: Accessible via walker Do you have any problems obtaining your medications?: No  Social/Family/Support Systems Patient Roles: Spouse;Parent Contact Information: Dtr's home: [phone] Anticipated Caregiver: Daughter and husband Anticipated Caregiver's Contact Information: Daughter: Nia Nathaniel: c: [phone] / h: [phone]. Donia Guiles: [phone]. Ability/Limitations of Caregiver: Min-mod assist Caregiver Availability: 24/7 Discharge Plan Discussed with Primary Caregiver: Yes, both daughter and husband Is Caregiver In Agreement with Plan?: Yes Does Caregiver/Family have Issues with Lodging/Transportation while Pt is in Rehab?: No  Goals/Additional Needs Patient/Family Goal for Rehab: supervision - min assistance Expected length of stay: 2 weeks Cultural Considerations: none Equipment Needs: TBD Pt/Family Agrees to Admission and willing to participate: Yes Program Orientation Provided & Reviewed with Pt/Caregiver Including Roles  & Responsibilities: Yes  Decrease burden of Care through IP rehab admission: n/a   Possible need for SNF placement upon discharge: no   Patient Condition: This patient's condition remains as documented in the consult dated 03/31/13, in which the Rehabilitation Physician determined and documented that the patient's condition is appropriate for intensive rehabilitative care in an inpatient rehabilitation facility. Will admit to inpatient rehab today.  Preadmission Screen Completed By:  Trish Mage, 04/02/2013 9:03 AM ______________________________________________________________________   Discussed status with Dr. Riley Kill on 04/02/13 at 0902 and received telephone approval for admission today.  Admission Coordinator:  Trish Mage,  time0902/Date06/13/14

## 2013-04-01 NOTE — Progress Notes (Signed)
Subjective: Pt without c/o Some constipation  Objective: Vital signs in last 24 hours: Temp:  [97.2 F (36.2 C)-98.8 F (37.1 C)] 98.2 F (36.8 C) (06/12 0434) Pulse Rate:  [66-95] 71 (06/12 0600) Resp:  [14-29] 17 (06/12 0600) BP: (125-169)/(48-127) 164/66 mmHg (06/12 0600) SpO2:  [94 %-96 %] 95 % (06/12 0600) Weight change:     Intake/Output from previous day: 06/11 0701 - 06/12 0700 In: 970 [P.O.:330; I.V.:640] Out: 3550 [Urine:3550] Intake/Output this shift:    General appearance: alert Resp: clear to auscultation bilaterally Cardio: regular rate and rhythm GI: soft, non-tender; bowel sounds normal; no masses,  no organomegaly  Lab Results:  Recent Labs  03/31/13 0545 04/01/13 0446  WBC 10.4 9.9  HGB 11.3* 10.8*  HCT 32.3* 30.5*  PLT 161 141*   BMET  Recent Labs  03/30/13 1141 03/31/13 0545  NA 143 139  K 3.9 3.6  CL  --  107  CO2  --  24  GLUCOSE 220* 159*  BUN  --  20  CREATININE  --  0.62  CALCIUM  --  7.6*    Studies/Results: Ct Head Wo Contrast  03/30/2013   *RADIOLOGY REPORT*  Clinical Data: Follow-up left frontal craniotomy.  CT HEAD WITHOUT CONTRAST  Technique:  Contiguous axial images were obtained from the base of the skull through the vertex without contrast.  Comparison: MRI brain 03/26/2013.  CT head 03/26/2013.  Findings: Since the previous study, there are interval postoperative changes with left frontal craniotomy and resection of the previously identified extra-axial mass.  There is pneumocephalus consistent with postoperative change.  There is residual low attenuation change consistent with edema in the cortex and deep white matter in the left frontal and parietal region. This appears generally stable since the previous study.  Mild hyperintensity along the left side of the anterior falx probably representing focal postoperative hemorrhage.  There is improved mass effect with decreased midline shift, now measuring about 4 mm. Ventricles  are not dilated.  Mild effacement of the left anterior horn of the lateral ventricle.  Focal calcification in the right temporal region consistent with small calcified meningioma, measuring about 11 mm diameter, stable since previous study.  IMPRESSION: Postoperative changes in the left frontal region as described. Residual edema in the left frontal parietal region.  4 mm left to right midline shift represents significant improvement.   Original Report Authenticated By: Burman Nieves, M.D.    Medications: I have reviewed the patient's current medications.  Assessment/Plan: Hypertension: due to med- continue BP control- ok  2. Hyperglycemia- Better- due to steriods- continue SS insulin  3. Brain tumor -s/p Resection of large meningioma continue care per neurosurgery- Decadron, keppra-  4. hypothyriodsim- continue Synthriod  5. Mild anxiety- ok  6. PT consult 7.CIR vs SNF Ok to tranfer to telemetry Constipation- Bowel regiment   LOS: 6 days   Meghan Yu 04/01/2013, 7:57 AM

## 2013-04-02 ENCOUNTER — Inpatient Hospital Stay (HOSPITAL_COMMUNITY)
Admission: AD | Admit: 2013-04-02 | Discharge: 2013-04-15 | DRG: 945 | Disposition: A | Discharge status: Home Health | Payer: Medicare Other | Source: Intra-hospital | Attending: Physical Medicine & Rehabilitation | Admitting: Physical Medicine & Rehabilitation

## 2013-04-02 DIAGNOSIS — Z5189 Encounter for other specified aftercare: Principal | ICD-10-CM

## 2013-04-02 DIAGNOSIS — M1990 Unspecified osteoarthritis, unspecified site: Secondary | ICD-10-CM | POA: Diagnosis present

## 2013-04-02 DIAGNOSIS — E785 Hyperlipidemia, unspecified: Secondary | ICD-10-CM | POA: Diagnosis present

## 2013-04-02 DIAGNOSIS — B9620 Unspecified Escherichia coli [E. coli] as the cause of diseases classified elsewhere: Secondary | ICD-10-CM | POA: Diagnosis present

## 2013-04-02 DIAGNOSIS — D320 Benign neoplasm of cerebral meninges: Secondary | ICD-10-CM | POA: Diagnosis present

## 2013-04-02 DIAGNOSIS — T380X5A Adverse effect of glucocorticoids and synthetic analogues, initial encounter: Secondary | ICD-10-CM | POA: Diagnosis present

## 2013-04-02 DIAGNOSIS — D496 Neoplasm of unspecified behavior of brain: Secondary | ICD-10-CM | POA: Diagnosis present

## 2013-04-02 DIAGNOSIS — R739 Hyperglycemia, unspecified: Secondary | ICD-10-CM | POA: Diagnosis present

## 2013-04-02 DIAGNOSIS — E039 Hypothyroidism, unspecified: Secondary | ICD-10-CM | POA: Diagnosis present

## 2013-04-02 DIAGNOSIS — D62 Acute posthemorrhagic anemia: Secondary | ICD-10-CM | POA: Diagnosis present

## 2013-04-02 DIAGNOSIS — Z9889 Other specified postprocedural states: Secondary | ICD-10-CM

## 2013-04-02 DIAGNOSIS — C719 Malignant neoplasm of brain, unspecified: Secondary | ICD-10-CM

## 2013-04-02 DIAGNOSIS — R7309 Other abnormal glucose: Secondary | ICD-10-CM | POA: Diagnosis present

## 2013-04-02 DIAGNOSIS — K5900 Constipation, unspecified: Secondary | ICD-10-CM | POA: Diagnosis present

## 2013-04-02 DIAGNOSIS — E860 Dehydration: Secondary | ICD-10-CM | POA: Diagnosis present

## 2013-04-02 DIAGNOSIS — F411 Generalized anxiety disorder: Secondary | ICD-10-CM | POA: Diagnosis present

## 2013-04-02 LAB — URINE MICROSCOPIC-ADD ON

## 2013-04-02 LAB — TYPE AND SCREEN
Antibody Screen: NEGATIVE
Unit division: 0
Unit division: 0
Unit division: 0

## 2013-04-02 LAB — URINALYSIS, ROUTINE W REFLEX MICROSCOPIC
Bilirubin Urine: NEGATIVE
Nitrite: NEGATIVE
Specific Gravity, Urine: 1.012 (ref 1.005–1.030)
Urobilinogen, UA: 1 mg/dL (ref 0.0–1.0)
pH: 8 (ref 5.0–8.0)

## 2013-04-02 LAB — GLUCOSE, CAPILLARY
Glucose-Capillary: 114 mg/dL — ABNORMAL HIGH (ref 70–99)
Glucose-Capillary: 126 mg/dL — ABNORMAL HIGH (ref 70–99)
Glucose-Capillary: 151 mg/dL — ABNORMAL HIGH (ref 70–99)

## 2013-04-02 MED ORDER — PROCHLORPERAZINE MALEATE 5 MG PO TABS
5.0000 mg | ORAL_TABLET | Freq: Four times a day (QID) | ORAL | Status: DC | PRN
  Filled 2013-04-02: qty 2

## 2013-04-02 MED ORDER — MAGNESIUM CHLORIDE 64 MG PO TBEC
1.0000 | DELAYED_RELEASE_TABLET | Freq: Every day | ORAL | Status: DC
  Administered 2013-04-03 – 2013-04-04 (×2): 64 mg via ORAL
  Filled 2013-04-02 (×5): qty 1

## 2013-04-02 MED ORDER — PROCHLORPERAZINE EDISYLATE 5 MG/ML IJ SOLN
5.0000 mg | Freq: Four times a day (QID) | INTRAMUSCULAR | Status: DC | PRN
  Filled 2013-04-02: qty 2

## 2013-04-02 MED ORDER — ACETAMINOPHEN 325 MG PO TABS: 325.0000 mg | ORAL_TABLET | ORAL | Status: DC | PRN

## 2013-04-02 MED ORDER — ALUM & MAG HYDROXIDE-SIMETH 200-200-20 MG/5ML PO SUSP: 30.0000 mL | ORAL | Status: DC | PRN

## 2013-04-02 MED ORDER — LEVETIRACETAM 100 MG/ML PO SOLN
500.0000 mg | Freq: Two times a day (BID) | ORAL | Status: DC
  Administered 2013-04-02 – 2013-04-13 (×23): 500 mg via ORAL
  Filled 2013-04-02 (×26): qty 5

## 2013-04-02 MED ORDER — POLYETHYLENE GLYCOL 3350 17 G PO PACK
17.0000 g | PACK | Freq: Every day | ORAL | Status: DC | PRN
  Administered 2013-04-06: 17 g via ORAL
  Filled 2013-04-02: qty 1

## 2013-04-02 MED ORDER — LEVOTHYROXINE SODIUM 75 MCG PO TABS
75.0000 ug | ORAL_TABLET | Freq: Every day | ORAL | Status: DC
  Administered 2013-04-03 – 2013-04-15 (×13): 75 ug via ORAL
  Filled 2013-04-02 (×15): qty 1

## 2013-04-02 MED ORDER — SIMVASTATIN 20 MG PO TABS
20.0000 mg | ORAL_TABLET | Freq: Every day | ORAL | Status: DC
  Administered 2013-04-02 – 2013-04-14 (×13): 20 mg via ORAL
  Filled 2013-04-02 (×14): qty 1

## 2013-04-02 MED ORDER — POLYETHYLENE GLYCOL 3350 17 G PO PACK
17.0000 g | PACK | Freq: Two times a day (BID) | ORAL | Status: DC
  Filled 2013-04-02 (×2): qty 1

## 2013-04-02 MED ORDER — PROCHLORPERAZINE 25 MG RE SUPP
12.5000 mg | Freq: Four times a day (QID) | RECTAL | Status: DC | PRN
  Filled 2013-04-02: qty 1

## 2013-04-02 MED ORDER — DEXAMETHASONE 2 MG PO TABS
2.0000 mg | ORAL_TABLET | Freq: Three times a day (TID) | ORAL | Status: DC
  Administered 2013-04-02 – 2013-04-04 (×6): 2 mg via ORAL
  Filled 2013-04-02 (×9): qty 1

## 2013-04-02 MED ORDER — ALPRAZOLAM 0.5 MG PO TABS
2.0000 mg | ORAL_TABLET | Freq: Every day | ORAL | Status: DC
  Administered 2013-04-02 – 2013-04-14 (×13): 2 mg via ORAL
  Filled 2013-04-02 (×13): qty 4

## 2013-04-02 MED ORDER — BISACODYL 10 MG RE SUPP: 10.0000 mg | Freq: Every day | RECTAL | Status: DC | PRN

## 2013-04-02 MED ORDER — DIPHENHYDRAMINE HCL 12.5 MG/5ML PO ELIX: 12.5000 mg | ORAL_SOLUTION | Freq: Four times a day (QID) | ORAL | Status: DC | PRN

## 2013-04-02 MED ORDER — ALPRAZOLAM 0.5 MG PO TABS: 0.5000 mg | ORAL_TABLET | Freq: Two times a day (BID) | ORAL | Status: DC | PRN

## 2013-04-02 MED ORDER — LEVETIRACETAM 500 MG PO TABS
500.0000 mg | ORAL_TABLET | Freq: Two times a day (BID) | ORAL | Status: DC
  Filled 2013-04-02 (×2): qty 1

## 2013-04-02 MED ORDER — GUAIFENESIN-DM 100-10 MG/5ML PO SYRP: 5.0000 mL | ORAL_SOLUTION | Freq: Four times a day (QID) | ORAL | Status: DC | PRN

## 2013-04-02 MED ORDER — TRAZODONE HCL 50 MG PO TABS: 25.0000 mg | ORAL_TABLET | Freq: Every evening | ORAL | Status: DC | PRN

## 2013-04-02 MED ORDER — INSULIN ASPART 100 UNIT/ML ~~LOC~~ SOLN
0.0000 [IU] | Freq: Three times a day (TID) | SUBCUTANEOUS | Status: DC
  Administered 2013-04-02 – 2013-04-03 (×3): 1 [IU] via SUBCUTANEOUS
  Administered 2013-04-03: 3 [IU] via SUBCUTANEOUS
  Administered 2013-04-04 (×2): 2 [IU] via SUBCUTANEOUS
  Administered 2013-04-05 – 2013-04-06 (×3): 1 [IU] via SUBCUTANEOUS
  Administered 2013-04-06: 2 [IU] via SUBCUTANEOUS
  Administered 2013-04-07 – 2013-04-08 (×3): 1 [IU] via SUBCUTANEOUS
  Administered 2013-04-09: 2 [IU] via SUBCUTANEOUS
  Administered 2013-04-09: 1 [IU] via SUBCUTANEOUS
  Administered 2013-04-09 – 2013-04-10 (×2): 2 [IU] via SUBCUTANEOUS
  Administered 2013-04-10 – 2013-04-11 (×3): 1 [IU] via SUBCUTANEOUS
  Administered 2013-04-11 – 2013-04-12 (×2): 2 [IU] via SUBCUTANEOUS
  Administered 2013-04-12 – 2013-04-15 (×7): 1 [IU] via SUBCUTANEOUS

## 2013-04-02 MED ORDER — FLEET ENEMA 7-19 GM/118ML RE ENEM: 1.0000 | ENEMA | Freq: Once | RECTAL | Status: AC | PRN

## 2013-04-02 NOTE — H&P (Signed)
Physical Medicine and Rehabilitation Admission H&P  Chief Complaint   Patient presents with   .  Extremity Weakness   :  HPI: Meghan Yu is a 79 y.o. RH-female with history of hypothyroid, OA; admitted on 03/26/13 with lethargy, two week history of progressive right sided weakness with right facial droop, difficulty walking, lethargy and apraxia. MRI brain done CT head revealed large left hemispheric frontal and posterior frontal extra-axial mass likely meningioma with significant vasogenic edema and 1 cm left to right shift. She was started on IV decadron. On 03/30/13, she underwent L-frontal craniotomy with gross total resection of tumor by Dr. Danielle Dess with improvement in RUE movement. ABLA treated with 2 units PRBC and mentation is improving. Therapies initiatiated and patient continues to be limited by right sided weakness, fatigue and well as delayed processing with mild right inattention. Therapy team recommending CIR and patient admitted today.    Review of Systems:  HENT: Negative for hearing loss. insomnia Eyes: Positive for blurred vision. Negative for double vision.   Respiratory: Negative for cough and shortness of breath.  Cardiovascular: Negative for chest pain and palpitations.  Gastrointestinal:  Bowel urgency/dumping syndrome? Past meals. Since GB surgery.  Genitourinary: Negative for urgency and frequency.  Musculoskeletal: Positive for joint pain (right knee pain-recent injection. ).  Neurological: Positive for focal weakness, weakness and headaches.  Psychiatric/Behavioral: Nervous/anxious: does not drive much due to anxiety.  Past Medical History   Diagnosis  Date   .  Hypothyroidism    .  Hyperlipidemia    .  Anxiety disorder    .  Arthritis of knee, right      Needs replacement/Dr. Lajoyce Corners   .  Colon polyps      colonoscopy by Dr. Laural Benes   .  Degenerative arthritis of hand      bilateral    Past Surgical History   Procedure  Laterality  Date   .  Knee  arthroscopy     .  Cholecystectomy     .  Abdominal hysterectomy     .  Craniotomy  Left  03/30/2013     Procedure: LEFT FRONTAL CRANIOTOMY FOR RESECTION OF MENINGIOMA; Surgeon: Barnett Abu, MD; Location: [facility]; Service: Neurosurgery; Laterality: Left; Left Frontal Craniotomy for tumor excision    Family History   Problem  Relation  Age of Onset   .  Stroke  Mother    .  Cirrhosis  Father    .  Emphysema  Sister     Social History: Divorced. Homemaker. Lives alone--daughter stays with her in the winter. Daughter works but plans on being there at nights past discharge. Ex-husband and daughter in law can provide supervision past discharge. She reports that she has never smoked. She does not have any smokeless tobacco history on file. She reports that she does not drink alcohol or use illicit drugs  Allergies: No Known Allergies  Medications Prior to Admission   Medication  Sig  Dispense  Refill   .  calcium carbonate (OS-CAL) 600 MG TABS  Take 600 mg by mouth 2 (two) times daily with a meal.     .  Cholecalciferol (VITAMIN D) 2000 UNITS CAPS  Take 1 capsule by mouth daily.     Marland Kitchen  esomeprazole (NEXIUM) 40 MG capsule  Take 40 mg by mouth daily before breakfast.     .  levothyroxine (SYNTHROID, LEVOTHROID) 75 MCG tablet  Take 75 mcg by mouth daily before breakfast.     .  magnesium chloride (SLOW-MAG) 64 MG TBEC  Take 1 tablet by mouth daily.     .  Multiple Vitamin (MULTIVITAMIN WITH MINERALS) TABS  Take 1 tablet by mouth daily.     .  Omega-3 Fatty Acids (FISH OIL) 1000 MG CAPS  Take 1,000-2,000 mg by mouth 2 (two) times daily. Takes 2000 mg in the morning and 1000 mg in the evening     .  simvastatin (ZOCOR) 20 MG tablet  Take 20 mg by mouth every evening.     .  [DISCONTINUED] ALPRAZolam (XANAX) 1 MG tablet  Take 1 mg by mouth 2 (two) times daily.     .  [DISCONTINUED] estradiol (ESTRACE) 1 MG tablet  Take 1 mg by mouth daily.     .  [DISCONTINUED] sulindac (CLINORIL) 200 MG tablet   Take 200 mg by mouth daily.      Home:  Home Living  Lives With: Daughter (pt may live with husband at times)  Available Help at Discharge: Available 24 hours/day  Type of Home: House  Home Access: Stairs to enter  [employer] of Steps: 5  Entrance Stairs-Rails: Left  Home Layout: One level  Bathroom Shower/Tub: Pension scheme manager: Standard  Home Adaptive Equipment: Hand-held shower hose;Built-in shower seat;Grab bars in shower;Walker - standard  Functional History:  Prior Function  Able to Take Stairs?: Yes  Driving: Yes (pt drives when she needs to)  Vocation: Retired  Comments: Pt reports she helped clean up some but her daughter did most of it  Functional Status:  Mobility:  Bed Mobility  Bed Mobility: Sit to Supine;Sitting - Scoot to [employer] of Bed;Supine to Sit  Supine to Sit: 2: Max assist  Sitting - Scoot to [employer] of Bed: 3: Mod assist  Sit to Supine: 3: Mod assist  Transfers  Transfers: Sit to Stand;Stand to Sit  Sit to Stand: 3: Mod assist;With upper extremity assist;From bed;From chair/3-in-1  Sit to Stand: Patient Percentage: 60%  Stand to Sit: 4: Min assist;With upper extremity assist;To bed;To chair/3-in-1  Stand to Sit: Patient Percentage: 70%  Ambulation/Gait  Ambulation/Gait Assistance: 3: Mod assist  Ambulation/Gait: Patient Percentage: 70%  Ambulation Distance (Feet): 200 Feet  Assistive device: 1 person hand held assist  Ambulation/Gait Assistance Details: loss of balance on turns and if looking to left, demonstrates right hip retraction and keeps right foot turned out, geve hand held assist on right side to allow increasd attention to this side.  Gait Pattern: Step-through pattern;Trunk rotated posteriorly on right;Decreased stride length;Decreased hip/knee flexion - right  Gait velocity: decreased   ADL:   Cognition:  Cognition  Overall Cognitive Status: Impaired/Different from baseline  Arousal/Alertness: Awake/alert   Orientation Level: Oriented X4  Attention: Sustained;Selective  Sustained Attention: Appears intact  Selective Attention: Impaired  Selective Attention Impairment: Verbal basic;Functional basic  Memory: Impaired  Memory Impairment: Prospective memory;Decreased recall of new information  Awareness: Impaired  Awareness Impairment: Anticipatory impairment  Problem Solving: (to be assessed further)  Executive Function: Organizing;Decision Making;Self Monitoring;Self Correcting  Safety/Judgment: (TBD)  Cognition  Arousal/Alertness: Awake/alert  Behavior During Therapy: Flat affect  Overall Cognitive Status: Impaired/Different from baseline  Area of Impairment: Following commands;Problem solving  Following Commands: Follows one step commands with increased time  Problem Solving: Slow processing  General Comments: Pt with significantly delayed processing. Pt requires simple 1-step commands and increased time to complete tasks. Pt initiated standing and then stopped, requiring repeated verbal cues to continue. Pt responding more consistently to functional  tasks (external focus) rather than motor commands    Physical Exam:  Blood pressure 144/56, pulse 89, temperature 98.3 F (36.8 C), temperature source Oral, resp. rate 21, height 5\' 1"  (1.549 m), weight 60.782 kg (134 lb), SpO2 94.00%.  Nursing note and vitals reviewed.  Constitutional: She is oriented to person, place, and time. She appears well-developed and well-nourished.  HENT:  Scalp incision clean and dry with staples and sutures in place.  Eyes: Pupils are equal, round, and reactive to light.  Neck: Normal range of motion.  Cardiovascular: Normal rate and regular rhythm.  Pulmonary/Chest: Effort normal and breath sounds normal. No respiratory distress.  Abdominal: Soft. Bowel sounds are normal. She exhibits no distension. There is no tenderness.  Musculoskeletal: She exhibits edema (right knee).  Neurological: She is alert and  oriented to person, place, and time.    Flat affect, looks straight ahead but able to turn to left with cues. Denies dizziness. No nystagmus noted. Mild right facial weakness. No dysarthria. Voice is hoarse.  Delayed output with measured movements. Able to follow basic commands. Oriented to self, place, situation, age/DOB. Decreased awareness of deficits. RUE 4/5. LUE 4+/5. RLE is 4/5. LLE is 4+/5. Sensation intact to light touch and PP on the right. Decreased FMC of right arm and leg.  Skin: Skin is warm and dry. bruising along left neck as well as bilateral UE's.  motor strength 2 minus in the right deltoid, biceps, triceps, grip  3 minus the right hip flexors knee extensor 2 minus in ankle dorsiflexor plantar flexor  5/5 in the left deltoid, biceps, triceps, grip, hip flexor, knee extensors, ankle dorsiflexor plantar flexor  Sensation reduced to light touch in the right upper and right lower extremity normal in the left upper and left lower extremity  Results for orders placed during the hospital encounter of 03/26/13 (from the past 48 hour(s))   CBC Status: Abnormal    Collection Time    04/01/13 4:46 AM   Result  Value  Range    WBC  9.9  4.0 - 10.5 K/uL    RBC  3.62 (*)  3.87 - 5.11 MIL/uL    Hemoglobin  10.8 (*)  12.0 - 15.0 g/dL    HCT  81.1 (*)  91.4 - 46.0 %    MCV  84.3  78.0 - 100.0 fL    MCH  29.8  26.0 - 34.0 pg    MCHC  35.4  30.0 - 36.0 g/dL    RDW  78.2  95.6 - 21.3 %    Platelets  141 (*)  150 - 400 K/uL   GLUCOSE, CAPILLARY Status: Abnormal    Collection Time    04/01/13 8:13 AM   Result  Value  Range    Glucose-Capillary  157 (*)  70 - 99 mg/dL   GLUCOSE, CAPILLARY Status: Abnormal    Collection Time    04/01/13 11:55 AM   Result  Value  Range    Glucose-Capillary  137 (*)  70 - 99 mg/dL   GLUCOSE, CAPILLARY Status: Abnormal    Collection Time    04/01/13 3:19 PM   Result  Value  Range    Glucose-Capillary  157 (*)  70 - 99 mg/dL    Comment 1  Notify RN      Comment 2  Documented in Chart    GLUCOSE, CAPILLARY Status: Abnormal    Collection Time    04/01/13 10:04 PM   Result  Value  Range    Glucose-Capillary  153 (*)  70 - 99 mg/dL   GLUCOSE, CAPILLARY Status: Abnormal    Collection Time    04/02/13 7:52 AM   Result  Value  Range    Glucose-Capillary  114 (*)  70 - 99 mg/dL   No results found.  Post Admission Physician Evaluation:  1. Functional deficits secondary to right frontal meningioma s/p resection. 2. Patient is admitted to receive collaborative, interdisciplinary care between the physiatrist, rehab nursing staff, and therapy team. 3. Patient's level of medical complexity and substantial therapy needs in context of that medical necessity cannot be provided at a lesser intensity of care such as a SNF. 4. Patient has experienced substantial functional loss from his/her baseline which was documented above under the "Functional History" and "Functional Status" headings. Judging by the patient's diagnosis, physical exam, and functional history, the patient has potential for functional progress which will result in measurable gains while on inpatient rehab. These gains will be of substantial and practical use upon discharge in facilitating mobility and self-care at the household level. 5. Physiatrist will provide 24 hour management of medical needs as well as oversight of the therapy plan/treatment and provide guidance as appropriate regarding the interaction of the two. 6. 24 hour rehab nursing will assist with bladder management, bowel management, safety, skin/wound care, disease management, medication administration, pain management and patient education and help integrate therapy concepts, techniques,education, etc. 7. PT will assess and treat for/with: Lower extremity strength, range of motion, stamina, balance, functional mobility, safety, adaptive techniques and equipment, NMR, cognitive perceptual rx, . Goals are: supervision. 8. OT will  assess and treat for/with: ADL's, functional mobility, safety, upper extremity strength, adaptive techniques and equipment, NMR, visual spatial and cognitive perceptual rx. Goals are: supervision. 9. SLP will assess and treat for/with: cognition, communication. Goals are: mod I. 10. Case Management and Social Worker will assess and treat for psychological issues and discharge planning. 11. Team conference will be held weekly to assess progress toward goals and to determine barriers to discharge. 12. Patient will receive at least 3 hours of therapy per day at least 5 days per week. 13. ELOS: 8-10 days Prognosis: excellent   Medical Problem List and Plan:  1. DVT Prophylaxis/Anticoagulation: Mechanical: Sequential compression devices, below knee Bilateral lower extremities  2. Pain Management: Headaches resolved. Use tylenol prn  3. Mood: Anxiety seems to be controlled except at night. Resume home hs dose of xanax.   -monitor clinically as well. 4. Neuropsych: This patient is capable of making decisions on her own behalf.  5. Steroid induced hyperglycemia: Will monitor BS with AC/HS checks. Use SSI as needed for elevated BS.  6. ABLA: Will recheck on Monday.  7. Constipation: Resolved with miralax. Will back off on laxatives due to history of bowel urgency--use prn.  8. Hypothyroid: continue supplement.            Ranelle Oyster, MD, [facility] Physical Medicine & Rehabilitation

## 2013-04-02 NOTE — Progress Notes (Signed)
Over night no new issue Doing well- VSS Right leg weakness  Improved Left facial swelling better BP ok No change to D/c Summary done 04/01/13 Ok to D/c to CIR today. Georgann Housekeeper

## 2013-04-02 NOTE — Plan of Care (Addendum)
Overall Plan of Care [facility]) Patient Details Name: Meghan Yu MRN: [id number] DOB: [birth year]  Diagnosis:   rehabilitation for right hemiparesis secondary to left frontal meningioma   Co-morbidities: Hypothyroidism  .  Hyperlipidemia  .  Anxiety disorder  .  Arthritis of knee, right  Needs replacement/Dr. Lajoyce Corners  .  Colon polyps  colonoscopy by Dr. Laural Benes  .  Degenerative arthritis of hand  bilateral    Functional Problem List  Patient demonstrates impairments in the following areas: Balance, Bladder, Cognition, Endurance, Medication Management, Motor, Nutrition, Pain, Perception, Safety, Sensory , Skin Integrity and Vision  Basic ADL's: grooming, bathing, dressing and toileting Advanced ADL's: simple meal preparation  Transfers:  bed mobility, bed to chair, toilet, tub/shower, car and furniture Locomotion:  ambulation and stairs  Additional Impairments:  Social Cognition   problem solving, memory and awareness  Anticipated Outcomes Item Anticipated Outcome  Eating/Swallowing    Basic self-care  supervision  Tolieting  supervision  Bowel/Bladder  Assist of one to bathroom  Transfers  supervision  Locomotion  supervision  Communication    Cognition  Min assist  Pain  Patient will have less than 3 rating of pain  Safety/Judgment  24/7 Supervision   Other     Therapy Plan: PT Intensity: Minimum of 1-2 x/day ,45 to 90 minutes PT Frequency: 5 out of 7 days PT Duration Estimated Length of Stay: 10-14 days OT Intensity: Minimum of 1-2 x/day, 45 to 90 minutes OT Frequency: 5 out of 7 days OT Duration/Estimated Length of Stay: 2 weeks ST Intensity:  Minimum of 1-2 x/day, 30-60 minutes ST Frequency:  5 out of 7 days ST Duration/Estimated length of stay:  1-2 weeks   Team Interventions: Item RN PT OT SLP SW TR Other  Self Care/Advanced ADL Retraining   x      Neuromuscular Re-Education  x x      Therapeutic Activities  x x X     UE/LE Strength  Training/ROM  x x      UE/LE Coordination Activities  x x      Visual/Perceptual Remediation/Compensation  x x      DME/Adaptive Equipment Instruction  x x      Therapeutic Exercise  x x      Balance/Vestibular Training  x x      Patient/Family Education x x x X     Cognitive Remediation/Compensation  x x X     Functional Mobility Training  x x      Ambulation/Gait Training  x       Furniture conservator/restorer Reintegration   x      Dysphagia/Aspiration Landscape architect Facilitation         Bladder Management x        Bowel Management x        Disease Management/Prevention         Pain Management x        Medication Management x   X     Skin Care/Wound Management x  x      Splinting/Orthotics         Discharge Planning   x X     Psychosocial Support x  x X  Team Discharge Planning: Destination: PT-Home ,OT- Home , SLP- Home Projected Follow-up: PT-Home health PT, OT-  Outpatient OT, SLP- None Projected Equipment Needs: PT- , OT-  , SLP- None Patient/family involved in discharge planning: PT- Patient,  OT-Patient, SLP- Patient/family  MD ELOS: 10-12 days  Medical Rehab Prognosis:  Excellent Assessment:  79 year old female admitted with right hemiparesis. Work up revealed left frontal meningioma Now requiring 24/7 Rehab RN,MD, as well as CIR level PT, OT and SLP.  Treatment team will focus on ADLs and mobility with goals set at supervision level  See Team Conference Notes for weekly updates to the plan of care     or

## 2013-04-02 NOTE — Progress Notes (Signed)
Patient ID: Meghan Yu, female   DOB: 1932-08-07, 79 y.o.   MRN: [id number] Stable, she is going to be transferred to the rehabilitation unit

## 2013-04-02 NOTE — Progress Notes (Signed)
Rehab admissions - I have a bed available on inpatient rehab today and can admit to inpatient rehab today.  Call me for questions.  #[phone]

## 2013-04-02 NOTE — Progress Notes (Signed)
At 12 noon, patient transferred to rehab unit. Discussed safety plan, therapy plan, and oriented patient to room. No acute distress. Stable. Mobility goals and plan of care to be initiated. Sherlyn Lees, RN

## 2013-04-03 ENCOUNTER — Inpatient Hospital Stay (HOSPITAL_COMMUNITY): Payer: PRIVATE HEALTH INSURANCE | Admitting: Occupational Therapy

## 2013-04-03 ENCOUNTER — Inpatient Hospital Stay (HOSPITAL_COMMUNITY): Payer: Medicare Other | Admitting: Physical Therapy

## 2013-04-03 ENCOUNTER — Inpatient Hospital Stay (HOSPITAL_COMMUNITY): Payer: Medicare Other | Admitting: Speech Pathology

## 2013-04-03 DIAGNOSIS — C719 Malignant neoplasm of brain, unspecified: Secondary | ICD-10-CM

## 2013-04-03 LAB — GLUCOSE, CAPILLARY
Glucose-Capillary: 146 mg/dL — ABNORMAL HIGH (ref 70–99)
Glucose-Capillary: 153 mg/dL — ABNORMAL HIGH (ref 70–99)

## 2013-04-03 NOTE — Evaluation (Signed)
Speech Language Pathology Assessment and Plan  Patient Details  Name: Nandi Tonnesen MRN: [id number] Date of Birth: [birth year]  SLP Diagnosis: Cognitive Impairments  Rehab Potential: Excellent ELOS: 8-10 days   Today's Date: 04/03/2013 Time: 4098-1191 Time Calculation (min): 60 min  Problem List:  Patient Active Problem List   Diagnosis Date Noted  . Brain tumor 03/26/2013  . Hypothyroidism 03/26/2013  . Hyperlipidemia 03/26/2013   Past Medical History:  Past Medical History  Diagnosis Date  . Hypothyroidism   . Hyperlipidemia   . Anxiety disorder   . Arthritis of knee, right     Needs replacement/Dr. Lajoyce Corners  . Colon polyps     colonoscopy by Dr. Laural Benes  . Degenerative arthritis of hand     bilateral   Past Surgical History:  Past Surgical History  Procedure Laterality Date  . Knee arthroscopy    . Cholecystectomy    . Abdominal hysterectomy    . Craniotomy Left 03/30/2013    Procedure: LEFT FRONTAL CRANIOTOMY FOR RESECTION OF MENINGIOMA;  Surgeon: Barnett Abu, MD;  Location: [facility];  Service: Neurosurgery;  Laterality: Left;  Left Frontal Craniotomy for tumor excision    Assessment / Plan / Recommendation Clinical Impression  The patient is a 79 year-old female with history of hypothyroid, OA, admitted on 03/26/13, with lethargy, two week history of progressive right sided weakness with right facial droop, difficulty walking, lethargy and apraxia. MRI brain done.  CT head revealed large left hemispheric frontal and posterior frontal extra-axial mass, likely meningioma, with significant vasogenic edema and 1 cm left to right shift.   On 03/30/13, she underwent L-frontal craniotomy with gross total resection of tumor by Dr. Danielle Dess, with improvement in RUE movement. Inpatient rehabilitation was recommended, and she was evaluated by Speech Therapy.  The patient, overall, is doing well.  She does display impairment with problem solving, safety awareness, as well as mild  memory impairment.  Therefore, speech therapy is recommended to help remediate patient in these areas, so that she may function as independently as possible when discharged back to home.    SLP Assessment  Patient will need skilled Speech Lanaguage Pathology Services during CIR admission    Recommendations       SLP Frequency 5 out of 7 days   SLP Treatment/Interventions Cognitive remediation/compensation;Patient/family education;Therapeutic Activities    Pain Pain Assessment Pain Assessment: No/denies pain Prior Functioning    Short Term Goals: Week 1: SLP Short Term Goal 1 (Week 1): The patient will demonstrate functional problem solving with basic and familiar tasks with min A multimodal cueing.  SLP Short Term Goal 2 (Week 1): The patient will utilize external memory aids to recall information with min A multimodal cueing.   SLP Short Term Goal 3 (Week 1): Patient will identify two physical deficits that may hinder safety, with [facility] A questions cues.   See FIM for current functional status Refer to Care Plan for Long Term Goals  Recommendations for other services: None  Discharge Criteria: Patient will be discharged from SLP if patient refuses treatment 3 consecutive times without medical reason, if treatment goals not met, if there is a change in medical status, if patient makes no progress towards goals or if patient is discharged from hospital.  The above assessment, treatment plan, treatment alternatives and goals were discussed and mutually agreed upon: by patient  Lenny Pastel 04/03/2013, 3:47 PM

## 2013-04-03 NOTE — Evaluation (Signed)
Physical Therapy Assessment and Plan  Patient Details  Name: Meghan Yu MRN: [id number] Date of Birth: 04/29/1932  PT Diagnosis: Abnormality of gait, Cognitive deficits, Coordination disorder, Hemiparesis dominant and Muscle weakness Rehab Potential: Good ELOS: 10-14 days   Today's Date: 04/03/2013 Time: 1100-1200 Time Calculation (min): 60 min  Problem List:  Patient Active Problem List   Diagnosis Date Noted  . Brain tumor 03/26/2013  . Hypothyroidism 03/26/2013  . Hyperlipidemia 03/26/2013    Past Medical History:  Past Medical History  Diagnosis Date  . Hypothyroidism   . Hyperlipidemia   . Anxiety disorder   . Arthritis of knee, right     Needs replacement/Dr. Lajoyce Corners  . Colon polyps     colonoscopy by Dr. Laural Benes  . Degenerative arthritis of hand     bilateral   Past Surgical History:  Past Surgical History  Procedure Laterality Date  . Knee arthroscopy    . Cholecystectomy    . Abdominal hysterectomy    . Craniotomy Left 03/30/2013    Procedure: LEFT FRONTAL CRANIOTOMY FOR RESECTION OF MENINGIOMA;  Surgeon: Barnett Abu, MD;  Location: [facility];  Service: Neurosurgery;  Laterality: Left;  Left Frontal Craniotomy for tumor excision    Assessment & Plan Clinical Impression:Meghan Yu is a 79 y.o. RH-female with history of hypothyroid, OA; admitted on 03/26/13 with lethargy, two week history of progressive right sided weakness with right facial droop, difficulty walking, lethargy and apraxia. MRI brain done CT head revealed large left hemispheric frontal and posterior frontal extra-axial mass likely meningioma with significant vasogenic edema and 1 cm left to right shift. She was started on IV decadron. On 03/30/13, she underwent L-frontal craniotomy with gross total resection of tumor by Dr. Danielle Dess with improvement in RUE movement. ABLA treated with 2 units PRBC and mentation is improving. Therapies initiatiated and patient continues to be limited by right sided  weakness, fatigue and well as delayed processing with mild right inattention.  Patient transferred to CIR on 04/02/2013 .   Patient currently requires mod with mobility secondary to impaired timing and sequencing, motor apraxia and decreased coordination.  Prior to hospitalization, patient was independent  with mobility and lived with Spouse;Daughter in a House home.  Home access is 4 to 5Stairs to enter.  Patient will benefit from skilled PT intervention to maximize safe functional mobility, minimize fall risk and decrease caregiver burden for planned discharge home with 24 hour supervision.  Anticipate patient will benefit from follow up HH at discharge.  PT - End of Session Activity Tolerance: Tolerates 10 - 20 min activity with multiple rests Endurance Deficit: Yes PT Assessment Rehab Potential: Good Barriers to Discharge: None PT Plan PT Intensity: Minimum of 1-2 x/day ,45 to 90 minutes PT Frequency: 5 out of 7 days PT Duration Estimated Length of Stay: 10-14 days PT Treatment/Interventions: Ambulation/gait training;Balance/vestibular training;Cognitive remediation/compensation;DME/adaptive equipment instruction;Functional mobility training;Neuromuscular re-education;Patient/family education;Stair training;Therapeutic Activities;Therapeutic Exercise;UE/LE Strength taining/ROM;UE/LE Coordination activities PT Recommendation Follow Up Recommendations: Home health PT Patient destination: Home  PT Evaluation Precautions/Restrictions Precautions Precautions: Fall Restrictions Weight Bearing Restrictions: No General Chart Reviewed: Yes Family/Caregiver Present: No  Pain Pain Assessment Pain Assessment: No/denies pain Home Living/Prior Functioning Home Living Lives With: Spouse;Daughter Available Help at Discharge: Available 24 hours/day Type of Home: House Home Access: Stairs to enter [employer] of Steps: 4 to 5 Entrance Stairs-Rails: Left Home Layout: One  level Bathroom Shower/Tub: Health visitor: Standard Bathroom Accessibility: Yes How Accessible: Accessible via walker;Accessible via wheelchair Home Adaptive  Equipment: Hand-held shower hose;Built-in shower seat;Grab bars in shower;Walker - standard Prior Function Able to Take Stairs?: Yes Driving: Yes Vocation: Retired Optometrist - History Baseline Vision: Wears glasses only for reading Patient Visual Report: No change from baseline Vision - Assessment Eye Alignment: Within Functional Limits Vision Assessment: Vision tested Alignment/Gaze Preference: Gaze left Tracking/Visual Pursuits: Requires cues, head turns, or add eye shifts to track Saccades: Additional eye shifts occurred during testing;Additional head turns occurred during testing;Decreased speed of saccadic movement Perception Perception: Impaired  Cognition Overall Cognitive Status: Impaired/Different from baseline Arousal/Alertness: Awake/alert Orientation Level: Oriented X4 Attention: Sustained Executive Function: Initiating Organizing: Impaired Organizing Impairment: Functional basic Decision Making: Impaired Decision Making Impairment: Functional basic Initiating: Impaired Initiating Impairment: Functional basic Self Monitoring: Impaired Self Monitoring Impairment: Functional basic Self Correcting: Impaired Self Correcting Impairment: Functional basic Sensation Sensation Proprioception: Impaired Detail Proprioception Impaired Details: Impaired RUE Coordination Coordination and Movement Description: requires more effort and visual attention to right for [facility] Finger Nose Finger Test: slow in time Motor  Motor Motor: Hemiplegia;Motor apraxia  Mobility Bed Mobility Bed Mobility: Rolling Right;Right Sidelying to Sit;Sitting - Scoot to [place] of Bed;Sit to Sidelying Right;Scooting to [facility] Rolling Right: 4: Min assist Rolling Right Details: Tactile cues for placement;Verbal cues  for sequencing Right Sidelying to Sit: 4: Min assist Right Sidelying to Sit Details (indicate cue type and reason): R LE OOB Supine to Sit: 3: Mod assist Sitting - Scoot to Edge of Bed: 4: Min guard Sit to Sidelying Right: 4: Min assist Scooting to [facility]: 5: Supervision Scooting to [facility] Details (indicate cue type and reason): Verbal cueing for use of rails and bending LE's to push Transfers Sit to Stand: 4: Min assist Stand to Sit: 4: Min assist Locomotion  Ambulation Ambulation: Yes Ambulation/Gait Assistance: 4: Min assist Ambulation Distance (Feet): 150 Feet Assistive device: Rolling walker Ambulation/Gait Assistance Details: 1 x LOB needing min-A to correct while using RW Gait Gait: Yes Gait Pattern: Step-through pattern;Decreased step length - right;Decreased hip/knee flexion - right Stairs / Additional Locomotion Stairs: Yes Stairs Assistance: 4: Min guard Stair Management Technique: Two rails Number of Stairs: 10 Wheelchair Mobility Wheelchair Mobility: No  Trunk/Postural Assessment  Cervical Assessment Cervical Assessment: Within Functional Limits Thoracic Assessment Thoracic Assessment:  (forwards flexed) Lumbar Assessment Lumbar Assessment:  (posterior pelvic tilt) Postural Control Postural Control: Deficits on evaluation Righting Reactions: delayed Protective Responses: delayed  Balance Balance Balance Assessed: Yes Static Sitting Balance Static Sitting - Balance Support: Feet supported Static Sitting - Level of Assistance: 4: Min assist (strong anterior lean) Static Standing Balance Static Standing - Level of Assistance: 4: Min assist;3: Mod assist Dynamic Standing Balance Dynamic Standing - Level of Assistance: 3: Mod assist Extremity Assessment  RUE Assessment RUE Assessment:  (3/5) LUE Assessment LUE Assessment:  (4/5) RLE Assessment RLE Assessment: Exceptions to [facility] (Right hip and knee flexion and knee ext, ankle df 2+/5) LLE Assessment LLE  Assessment: Within Functional Limits  FIM:  FIM - Bed/Chair Transfer Bed/Chair Transfer: 3: Supine > Sit: Mod A (lifting assist/Pt. 50-74%/lift 2 legs;4: Sit > Supine: Min A (steadying pt. > 75%/lift 1 leg);4: Bed > Chair or W/C: Min A (steadying Pt. > 75%);4: Chair or W/C > Bed: Min A (steadying Pt. > 75%) FIM - Locomotion: Ambulation Locomotion: Ambulation Assistive Devices: Walker - Rolling Ambulation/Gait Assistance: 4: Min assist Locomotion: Ambulation: 2: Travels 50 - 149 ft with minimal assistance (Pt.>75%) FIM - Locomotion: Stairs Locomotion: Stairs Assistive Devices: Hand rail - 2 Locomotion:  Stairs: 2: Up and Down 4 - 11 stairs with minimal assistance (Pt.>75%)   Refer to Care Plan for Long Term Goals  Recommendations for other services: None  Discharge Criteria: Patient will be discharged from PT if patient refuses treatment 3 consecutive times without medical reason, if treatment goals not met, if there is a change in medical status, if patient makes no progress towards goals or if patient is discharged from hospital.  The above assessment, treatment plan, treatment alternatives and goals were discussed and mutually agreed upon: by patient  Rex Kras 04/03/2013, 6:20 PM

## 2013-04-03 NOTE — Evaluation (Signed)
Occupational Therapy Assessment and Plan  Patient Details  Name: Meghan Yu MRN: [id number] Date of Birth: 05-31-1932  OT Diagnosis: apraxia, cognitive deficits and muscle weakness (generalized) Rehab Potential: Rehab Potential: Good ELOS: 2 weeks   Today's Date: 04/03/2013 Time: 6213-0865 Time Calculation (min): 55 min  Problem List:  Patient Active Problem List   Diagnosis Date Noted  . Brain tumor 03/26/2013  . Hypothyroidism 03/26/2013  . Hyperlipidemia 03/26/2013    Past Medical History:  Past Medical History  Diagnosis Date  . Hypothyroidism   . Hyperlipidemia   . Anxiety disorder   . Arthritis of knee, right     Needs replacement/Dr. Lajoyce Corners  . Colon polyps     colonoscopy by Dr. Laural Benes  . Degenerative arthritis of hand     bilateral   Past Surgical History:  Past Surgical History  Procedure Laterality Date  . Knee arthroscopy    . Cholecystectomy    . Abdominal hysterectomy    . Craniotomy Left 03/30/2013    Procedure: LEFT FRONTAL CRANIOTOMY FOR RESECTION OF MENINGIOMA;  Surgeon: Barnett Abu, MD;  Location: [facility];  Service: Neurosurgery;  Laterality: Left;  Left Frontal Craniotomy for tumor excision    Assessment & Plan Clinical Impression: Patient is a 79 y.o. year old RH-female with history of hypothyroid, OA; admitted on 03/26/13 with lethargy, two week history of progressive right sided weakness with right facial droop, difficulty walking, lethargy and apraxia. MRI brain done CT head revealed large left hemispheric frontal and posterior frontal extra-axial mass likely meningioma with significant vasogenic edema and 1 cm left to right shift. She was started on IV decadron. On 03/30/13, she underwent L-frontal craniotomy with gross total resection of tumor by Dr. Danielle Dess with improvement in RUE movement. ABLA treated with 2 units PRBC and mentation is improving.  Patient transferred to CIR on 04/02/2013 .    Patient currently requires mod to max  with  basic self-care skills and basic mobility secondary to muscle weakness, decreased cardiorespiratoy endurance, impaired timing and sequencing, unbalanced muscle activation, decreased coordination and decreased motor planning, decreased visual perceptual skills, decreased attention to right and decreased motor planning, decreased initiation, decreased attention, decreased awareness, decreased problem solving, decreased safety awareness, decreased memory and delayed processing and decreased standing balance, decreased postural control, decreased balance strategies and difficulty maintaining precautions.  Prior to hospitalization, patient could complete ADL with modified independent .  Patient will benefit from skilled intervention to decrease level of assist with basic self-care skills and increase independence with basic self-care skills prior to discharge home with care partner.  Anticipate patient will require 24 hour supervision and follow up outpatient.  OT - End of Session Activity Tolerance: Tolerates 30+ min activity with multiple rests Endurance Deficit: Yes OT Assessment Rehab Potential: Good OT Plan OT Intensity: Minimum of 1-2 x/day, 45 to 90 minutes OT Frequency: 5 out of 7 days OT Duration/Estimated Length of Stay: 2 weeks OT Treatment/Interventions: Balance/vestibular training;Cognitive remediation/compensation;Community reintegration;Discharge planning;Disease mangement/prevention;Pain management;Neuromuscular re-education;Functional mobility training;DME/adaptive equipment instruction;Patient/family education;Therapeutic Activities;Therapeutic Exercise;Psychosocial support;UE/LE Strength taining/ROM;Self Care/advanced ADL retraining;Skin care/wound managment;UE/LE Coordination activities;Visual/perceptual remediation/compensation OT Recommendation Patient destination: Home Follow Up Recommendations: Outpatient OT   Skilled Therapeutic Intervention   OT  Evaluation Precautions/Restrictions  Precautions Precautions: Fall Restrictions Weight Bearing Restrictions: No General Chart Reviewed: Yes Family/Caregiver Present: No Vital Signs   Pain Pain Assessment Pain Assessment: No/denies pain Home Living/Prior Functioning Home Living Lives With: Daughter Available Help at Discharge: Available 24 hours/day;Family Type of Home: House Home  Access: Stairs to enter [employer] of Steps: 4-5 Entrance Stairs-Rails: Left Home Layout: One level Bathroom Shower/Tub: Health visitor: Standard ADL   Vision/Perception  Vision - History Baseline Vision: Wears glasses only for reading Patient Visual Report: No change from baseline Vision - Assessment Eye Alignment: Within Functional Limits Vision Assessment: Vision tested Alignment/Gaze Preference: Gaze left Tracking/Visual Pursuits: Requires cues, head turns, or add eye shifts to track Saccades: Additional eye shifts occurred during testing;Additional head turns occurred during testing;Decreased speed of saccadic movement Perception Perception: Impaired Inattention/Neglect: Does not attend to right visual field;Does not attend to right side of body Praxis Praxis: Impaired Praxis Impairment Details: Motor planning  Cognition Overall Cognitive Status: Impaired/Different from baseline Arousal/Alertness: Awake/alert Orientation Level: Oriented X4 Attention: Sustained Sustained Attention: Appears intact Selective Attention: Impaired Selective Attention Impairment: Functional basic Memory: Impaired Memory Impairment: Prospective memory;Decreased recall of new information Awareness Impairment: Anticipatory impairment;Emergent impairment Executive Function: Initiating Organizing: Impaired Organizing Impairment: Functional basic Decision Making: Impaired Decision Making Impairment: Functional basic Initiating: Impaired Initiating Impairment: Functional  basic Self Monitoring: Impaired Self Monitoring Impairment: Functional basic Self Correcting: Impaired Self Correcting Impairment: Functional basic Safety/Judgment: Impaired Sensation Sensation Light Touch: Impaired Detail Light Touch Impaired Details: Impaired RUE;Impaired RLE Hot/Cold: Impaired Detail Hot/Cold Impaired Details: Impaired RUE;Impaired RLE Proprioception: Impaired Detail Proprioception Impaired Details: Impaired RUE Coordination Gross Motor Movements are Fluid and Coordinated: No Coordination and Movement Description: requires more effort and visual attention to right for [facility] Finger Nose Finger Test: slow in time Motor  Motor Motor: Motor apraxia;Abnormal postural alignment and control Mobility  Bed Mobility Supine to Sit: 3: Mod assist Transfers Sit to Stand: 4: Min assist;3: Mod assist Stand to Sit: 4: Min assist  Trunk/Postural Assessment  Cervical Assessment Cervical Assessment: Within Functional Limits Thoracic Assessment Thoracic Assessment:  (forwards flexed) Lumbar Assessment Lumbar Assessment:  (posterior pelvic tilt) Postural Control Postural Control: Deficits on evaluation Righting Reactions: delayed Protective Responses: delayed  Balance Balance Balance Assessed: Yes Static Sitting Balance Static Sitting - Balance Support: Feet supported Static Sitting - Level of Assistance: 4: Min assist (strong anterior lean) Static Standing Balance Static Standing - Level of Assistance: 4: Min assist;3: Mod assist Dynamic Standing Balance Dynamic Standing - Level of Assistance: 3: Mod assist Extremity/Trunk Assessment RUE Assessment RUE Assessment:  (3/5) LUE Assessment LUE Assessment:  (4/5)  FIM:  FIM - Grooming Grooming Steps: Wash, rinse, dry face;Wash, rinse, dry hands;Oral care, brush teeth, clean dentures;Brush, comb hair Grooming: 4: Patient completes 3 of 4 or 4 of 5 steps FIM - Bathing Bathing Steps Patient Completed: Chest;Right  Arm;Left Arm;Abdomen;Front perineal area;Right upper leg;Left upper leg Bathing: 3: Mod-Patient completes 5-7 65f 10 parts or 50-74% FIM - Upper Body Dressing/Undressing Upper body dressing/undressing steps patient completed: Thread/unthread left sleeve of pullover shirt/dress;Thread/unthread right sleeve of pullover shirt/dresss;Pull shirt over trunk;Put head through opening of pull over shirt/dress Upper body dressing/undressing: 4: Steadying assist FIM - Lower Body Dressing/Undressing Lower body dressing/undressing steps patient completed: Thread/unthread left underwear leg;Thread/unthread right pants leg;Don/Doff left shoe;Fasten/unfasten left shoe Lower body dressing/undressing: 2: Max-Patient completed 25-49% of tasks FIM - Toileting Toileting steps completed by patient: Adjust clothing prior to toileting;Adjust clothing after toileting Toileting: 3: Mod-Patient completed 2 of 3 steps FIM - Bed/Chair Transfer Bed/Chair Transfer: 3: Supine > Sit: Mod A (lifting assist/Pt. 50-74%/lift 2 legs;4: Chair or W/C > Bed: Min A (steadying Pt. > 75%) FIM - Toilet Transfers Toilet Transfers: 4-To toilet/BSC: Min A (steadying Pt. > 75%);3-From toilet/BSC: Mod  A (lift or lower assist) FIM - Tub/Shower Transfers Tub/shower Transfers: 3-Out of Tub/Shower: Mod A (lift or lower/lift 2 legs);3-Into Tub/Shower: Mod A (lift or lower/lift 2 legs)   Refer to Care Plan for Long Term Goals  Recommendations for other services: Neuropsych  Discharge Criteria: Patient will be discharged from OT if patient refuses treatment 3 consecutive times without medical reason, if treatment goals not met, if there is a change in medical status, if patient makes no progress towards goals or if patient is discharged from hospital.  The above assessment, treatment plan, treatment alternatives and goals were discussed and mutually agreed upon: by patient  1:1 OT eval initiated with OT purpose, role and goals discussed. Self care  retraining at shower level with focus on functional ambulation with HHA around room, toilet tranfers, shower stall transfers, sitting and standing balance, postural awareness, safety with mobility, activity tolerance, right attention to environment and body, coordination, initiation and simple problem solving. Pt with significant anterior lean and to the right with decreased awareness of self correction in the moment; responds "yea I do that sometimes."  Roney Mans [facility] 04/03/2013, 9:37 AM

## 2013-04-03 NOTE — Progress Notes (Signed)
Subjective/Complaints: Slept better with xanax. Just waking up. Denies pain.  A 12 point review of systems has been performed and if not noted above is otherwise negative.   Objective: Vital Signs: Blood pressure 138/66, pulse 70, temperature 97.7 F (36.5 C), temperature source Oral, resp. rate 18, height 5\' 1"  (1.549 m), weight 55 kg (121 lb 4.1 oz), SpO2 96.00%. No results found.  Recent Labs  04/01/13 0446  WBC 9.9  HGB 10.8*  HCT 30.5*  PLT 141*   No results found for this basename: NA, K, CL, CO, GLUCOSE, BUN, CREATININE, CALCIUM,  in the last 72 hours CBG (last 3)   Recent Labs  04/02/13 1645 04/02/13 2130 04/03/13 0703  GLUCAP 126* 151* 146*    Wt Readings from Last 3 Encounters:  04/02/13 55 kg (121 lb 4.1 oz)  03/26/13 60.782 kg (134 lb)  03/26/13 60.782 kg (134 lb)       Physical Exam:  Constitutional: She is oriented to person, place, and time. She appears well-developed and well-nourished.  HENT:  Scalp incision clean and dry with staples and sutures in place.  Eyes: Pupils are equal, round, and reactive to light.  Neck: Normal range of motion.  Cardiovascular: Normal rate and regular rhythm.  Pulmonary/Chest: Effort normal and breath sounds normal. No respiratory distress.  Abdominal: Soft. Bowel sounds are normal. She exhibits no distension. There is no tenderness.  Musculoskeletal: She exhibits edema (right knee).  Neurological: She is alert and oriented to person, place, and time.  Flat affect, looks straight ahead but able to turn to left with cues. Denies dizziness. No nystagmus noted. Mild right facial weakness. No dysarthria. Voice is hoarse. Delayed output with measured movements. Able to follow basic commands. Oriented to self, place, situation, age/DOB. Decreased awareness of deficits. RUE 4/5. LUE 4+/5. RLE is 4/5. LLE is 4+/5. Sensation intact to light touch and PP on the right. Decreased FMC of right arm and leg.  Skin: Skin is warm and  dry. bruising along left neck as well as bilateral UE's.  motor strength 2 minus in the right deltoid, biceps, triceps, grip  3 minus the right hip flexors knee extensor 2 minus in ankle dorsiflexor plantar flexor  5/5 in the left deltoid, biceps, triceps, grip, hip flexor, knee extensors, ankle dorsiflexor plantar flexor  Sensation reduced to light touch in the right upper and right lower extremity normal in the left upper and left lower extremity  Psych:  A little slow to arouse this am  Assessment/Plan: 1. Functional deficits secondary to right frontal meningioma s/p resection which require 3+ hours per day of interdisciplinary therapy in a comprehensive inpatient rehab setting. Physiatrist is providing close team supervision and 24 hour management of active medical problems listed below. Physiatrist and rehab team continue to assess barriers to discharge/monitor patient progress toward functional and medical goals. FIM:                   Comprehension Comprehension Mode: Auditory Comprehension: 5-Follows basic conversation/direction: With no assist  Expression Expression Mode: Verbal Expression: 5-Expresses basic needs/ideas: With no assist  Social Interaction Social Interaction: 6-Interacts appropriately with others with medication or extra time (anti-anxiety, antidepressant).  Problem Solving Problem Solving: 5-Solves basic 90% of the time/requires cueing < 10% of the time  Memory Memory: 5-Recognizes or recalls 90% of the time/requires cueing < 10% of the time Medical Problem List and Plan:  1. DVT Prophylaxis/Anticoagulation: Mechanical: Sequential compression devices, below knee Bilateral lower extremities  2.  Pain Management: Headaches resolved. Use tylenol prn  3. Mood: Anxiety seems to be controlled except at night. Resumed home hs dose of xanax.  -monitor clinically for excessive am sedation 4. Neuropsych: This patient is capable of making decisions on her  own behalf.  5. Steroid induced hyperglycemia: Will monitor BS with AC/HS checks. Use SSI as needed for elevated BS.  6. ABLA: Will recheck on Monday.  7. Constipation: Resolved with miralax. Will back off on laxatives due to history of bowel urgency--use prn.  8. Hypothyroid: continue supplement   LOS (Days) 1 A FACE TO FACE EVALUATION WAS PERFORMED  SWARTZ,ZACHARY T 04/03/2013 9:04 AM

## 2013-04-03 NOTE — Progress Notes (Signed)
Physical Therapy Session Note Patient Details  Name: Meghan Yu MRN: [id number] Date of Birth: [birth year]  Today's Date: 04/03/2013 Time: 1600-1630 Time Calculation (min): 30 min  Therapy Documentation Precautions:  Precautions Precautions: Fall Restrictions Weight Bearing Restrictions: No Pain: Pain Assessment Pain Assessment: No/denies pain  Gait Training:(15') using RW 2 x 200' with S/min-A and verbal/tactile cues for slowing down speed and maintaining position inside RW during turns. Therapeutic Exercise:(15') Nu-Step x 10' Level 4 with rest break   Therapy/Group: Individual Therapy  Rex Kras 04/03/2013, 6:38 PM

## 2013-04-04 ENCOUNTER — Inpatient Hospital Stay (HOSPITAL_COMMUNITY): Payer: PRIVATE HEALTH INSURANCE | Admitting: *Deleted

## 2013-04-04 LAB — GLUCOSE, CAPILLARY
Glucose-Capillary: 147 mg/dL — ABNORMAL HIGH (ref 70–99)
Glucose-Capillary: 112 mg/dL — ABNORMAL HIGH (ref 70–99)
Glucose-Capillary: 128 mg/dL — ABNORMAL HIGH (ref 70–99)

## 2013-04-04 LAB — URINE CULTURE

## 2013-04-04 MED ORDER — DEXAMETHASONE 2 MG PO TABS
2.0000 mg | ORAL_TABLET | Freq: Two times a day (BID) | ORAL | Status: DC
  Administered 2013-04-04 – 2013-04-13 (×19): 2 mg via ORAL
  Filled 2013-04-04 (×22): qty 1

## 2013-04-04 NOTE — Progress Notes (Signed)
Subjective/Complaints: Slept well again. No problems in therapy yesterday A 12 point review of systems has been performed and if not noted above is otherwise negative.   Objective: Vital Signs: Blood pressure 138/82, pulse 76, temperature 97.5 F (36.4 C), temperature source Oral, resp. rate 18, height 5\' 1"  (1.549 m), weight 55 kg (121 lb 4.1 oz), SpO2 97.00%. No results found. No results found for this basename: WBC, HGB, HCT, PLT,  in the last 72 hours No results found for this basename: NA, K, CL, CO, GLUCOSE, BUN, CREATININE, CALCIUM,  in the last 72 hours CBG (last 3)   Recent Labs  04/03/13 1639 04/03/13 2119 04/04/13 0712  GLUCAP 203* 153* 147*    Wt Readings from Last 3 Encounters:  04/02/13 55 kg (121 lb 4.1 oz)  03/26/13 60.782 kg (134 lb)  03/26/13 60.782 kg (134 lb)       Physical Exam:  Constitutional: She is oriented to person, place, and time. She appears well-developed and well-nourished.  HENT:  Scalp incision clean and dry with staples and sutures in place.  Eyes: Pupils are equal, round, and reactive to light.  Neck: Normal range of motion.  Cardiovascular: Normal rate and regular rhythm.  Pulmonary/Chest: Effort normal and breath sounds normal. No respiratory distress.  Abdominal: Soft. Bowel sounds are normal. She exhibits no distension. There is no tenderness.  Musculoskeletal: She exhibits edema (right knee).  Neurological: She is alert and oriented to person, place, and time.  Flat affect, looks straight ahead but able to turn to left with cues. Denies dizziness. No nystagmus noted. Mild right facial weakness. No dysarthria. Voice is hoarse. Delayed output with measured movements. Able to follow basic commands. Oriented to self, place, situation, age/DOB. Decreased awareness of deficits. RUE 4/5. LUE 4+/5. RLE is 4/5. LLE is 4+/5. Sensation intact to light touch and PP on the right. Decreased FMC of right arm and leg.  Skin: Skin is warm and dry.  bruising along left neck as well as bilateral UE's.  motor strength 2 minus in the right deltoid, biceps, triceps, grip  3 minus the right hip flexors knee extensor 2 minus in ankle dorsiflexor plantar flexor  5/5 in the left deltoid, biceps, triceps, grip, hip flexor, knee extensors, ankle dorsiflexor plantar flexor  Sensation reduced to light touch in the right upper and right lower extremity normal in the left upper and left lower extremity  Psych:  A little slow to arouse this am  Assessment/Plan: 1. Functional deficits secondary to right frontal meningioma s/p resection which require 3+ hours per day of interdisciplinary therapy in a comprehensive inpatient rehab setting. Physiatrist is providing close team supervision and 24 hour management of active medical problems listed below. Physiatrist and rehab team continue to assess barriers to discharge/monitor patient progress toward functional and medical goals. FIM: FIM - Bathing Bathing Steps Patient Completed: Chest;Right Arm;Left Arm;Abdomen;Front perineal area;Right upper leg;Left upper leg Bathing: 0: Activity did not occur  FIM - Upper Body Dressing/Undressing Upper body dressing/undressing steps patient completed: Thread/unthread left sleeve of pullover shirt/dress;Thread/unthread right sleeve of pullover shirt/dresss;Pull shirt over trunk;Put head through opening of pull over shirt/dress Upper body dressing/undressing: 0: Activity did not occur FIM - Lower Body Dressing/Undressing Lower body dressing/undressing steps patient completed: Thread/unthread left underwear leg;Thread/unthread right pants leg;Don/Doff left shoe;Fasten/unfasten left shoe Lower body dressing/undressing: 0: Activity did not occur  FIM - Toileting Toileting steps completed by patient: Adjust clothing prior to toileting;Adjust clothing after toileting Toileting: 0: Activity did not occur  FIM - Archivist Transfers: 0-Activity did not  occur  FIM - Bed/Chair Transfer Bed/Chair Transfer: 3: Supine > Sit: Mod A (lifting assist/Pt. 50-74%/lift 2 legs;4: Sit > Supine: Min A (steadying pt. > 75%/lift 1 leg);4: Bed > Chair or W/C: Min A (steadying Pt. > 75%);4: Chair or W/C > Bed: Min A (steadying Pt. > 75%)  FIM - Locomotion: Wheelchair Locomotion: Wheelchair: 0: Activity did not occur FIM - Locomotion: Ambulation Locomotion: Ambulation Assistive Devices: Designer, industrial/product Ambulation/Gait Assistance: 4: Min assist Locomotion: Ambulation: 0: Activity did not occur  Comprehension Comprehension Mode: Auditory Comprehension: 5-Follows basic conversation/direction: With extra time/assistive device  Expression Expression Mode: Verbal Expression: 5-Expresses basic needs/ideas: With no assist  Social Interaction Social Interaction: 6-Interacts appropriately with others with medication or extra time (anti-anxiety, antidepressant).  Problem Solving Problem Solving: 4-Solves basic 75 - 89% of the time/requires cueing 10 - 24% of the time  Memory Memory: 5-Recognizes or recalls 90% of the time/requires cueing < 10% of the time Medical Problem List and Plan:  1. DVT Prophylaxis/Anticoagulation: Mechanical: Sequential compression devices, below knee Bilateral lower extremities  2. Pain Management: Headaches resolved. Use tylenol prn  3. Mood: Anxiety seems to be controlled except at night. Resumed home hs dose of xanax.  -monitor clinically for am sedation--none at present 4. Neuropsych: This patient is capable of making decisions on her own behalf.  5. Steroid induced hyperglycemia: Will monitor BS with AC/HS checks. Use SSI as needed for elevated BS.   -wean steroids 6. ABLA: Will recheck on Monday.  7. Constipation: Resolved with miralax. Will back off on laxatives due to history of bowel urgency--use prn.  8. Hypothyroid: continue supplement   LOS (Days) 2 A FACE TO FACE EVALUATION WAS PERFORMED  Leea Rambeau  T 04/04/2013 8:53 AM

## 2013-04-05 ENCOUNTER — Inpatient Hospital Stay (HOSPITAL_COMMUNITY): Payer: Medicare Other | Admitting: Occupational Therapy

## 2013-04-05 ENCOUNTER — Inpatient Hospital Stay (HOSPITAL_COMMUNITY): Payer: Medicare Other | Admitting: Speech Pathology

## 2013-04-05 ENCOUNTER — Inpatient Hospital Stay (HOSPITAL_COMMUNITY): Payer: Medicare Other | Admitting: Physical Therapy

## 2013-04-05 DIAGNOSIS — N390 Urinary tract infection, site not specified: Secondary | ICD-10-CM | POA: Diagnosis present

## 2013-04-05 DIAGNOSIS — E860 Dehydration: Secondary | ICD-10-CM | POA: Diagnosis present

## 2013-04-05 DIAGNOSIS — R739 Hyperglycemia, unspecified: Secondary | ICD-10-CM | POA: Diagnosis present

## 2013-04-05 DIAGNOSIS — C719 Malignant neoplasm of brain, unspecified: Secondary | ICD-10-CM

## 2013-04-05 DIAGNOSIS — B9620 Unspecified Escherichia coli [E. coli] as the cause of diseases classified elsewhere: Secondary | ICD-10-CM | POA: Diagnosis present

## 2013-04-05 LAB — HEMOGLOBIN A1C: Mean Plasma Glucose: 117 mg/dL — ABNORMAL HIGH (ref <117)

## 2013-04-05 LAB — CBC WITH DIFFERENTIAL/PLATELET
Basophils Absolute: 0 10*3/uL (ref 0.0–0.1)
Basophils Relative: 0 % (ref 0–1)
Eosinophils Relative: 1 % (ref 0–5)
Lymphocytes Relative: 18 % (ref 12–46)
MCHC: 34.4 g/dL (ref 30.0–36.0)
MCV: 85.9 fL (ref 78.0–100.0)
Platelets: 193 10*3/uL (ref 150–400)
RDW: 12.8 % (ref 11.5–15.5)
WBC: 8.9 10*3/uL (ref 4.0–10.5)

## 2013-04-05 LAB — GLUCOSE, CAPILLARY
Glucose-Capillary: 122 mg/dL — ABNORMAL HIGH (ref 70–99)
Glucose-Capillary: 112 mg/dL — ABNORMAL HIGH (ref 70–99)

## 2013-04-05 LAB — COMPREHENSIVE METABOLIC PANEL
ALT: 16 U/L (ref 0–35)
AST: 12 U/L (ref 0–37)
Albumin: 2.7 g/dL — ABNORMAL LOW (ref 3.5–5.2)
CO2: 28 mEq/L (ref 19–32)
Calcium: 8.2 mg/dL — ABNORMAL LOW (ref 8.4–10.5)
Sodium: 136 mEq/L (ref 135–145)
Total Protein: 5.5 g/dL — ABNORMAL LOW (ref 6.0–8.3)

## 2013-04-05 MED ORDER — CEPHALEXIN 250 MG PO CAPS
250.0000 mg | ORAL_CAPSULE | Freq: Three times a day (TID) | ORAL | Status: AC
  Administered 2013-04-05 – 2013-04-11 (×21): 250 mg via ORAL
  Filled 2013-04-05 (×21): qty 1

## 2013-04-05 MED ORDER — MAGNESIUM OXIDE 400 (241.3 MG) MG PO TABS
200.0000 mg | ORAL_TABLET | Freq: Every day | ORAL | Status: DC
  Administered 2013-04-05 – 2013-04-15 (×11): 200 mg via ORAL
  Filled 2013-04-05 (×12): qty 0.5

## 2013-04-05 NOTE — Progress Notes (Signed)
Social Work Assessment and Plan Social Work Assessment and Plan  Patient Details  Name: Meghan Yu MRN: [id number] Date of Birth: [birth year]  Today's Date: 04/05/2013  Problem List:  Patient Active Problem List   Diagnosis Date Noted  . Hyperglycemia--steroid induced 04/05/2013  . Dehydration 04/05/2013  . E. coli UTI 04/05/2013  . Brain tumor 03/26/2013  . Hypothyroidism 03/26/2013  . Hyperlipidemia 03/26/2013   Past Medical History:  Past Medical History  Diagnosis Date  . Hypothyroidism   . Hyperlipidemia   . Anxiety disorder   . Arthritis of knee, right     Needs replacement/Dr. Lajoyce Corners  . Colon polyps     colonoscopy by Dr. Laural Benes  . Degenerative arthritis of hand     bilateral   Past Surgical History:  Past Surgical History  Procedure Laterality Date  . Knee arthroscopy    . Cholecystectomy    . Abdominal hysterectomy    . Craniotomy Left 03/30/2013    Procedure: LEFT FRONTAL CRANIOTOMY FOR RESECTION OF MENINGIOMA;  Surgeon: Barnett Abu, MD;  Location: [facility];  Service: Neurosurgery;  Laterality: Left;  Left Frontal Craniotomy for tumor excision   Social History:  reports that she has never smoked. She does not have any smokeless tobacco history on file. She reports that she does not drink alcohol or use illicit drugs.  Family / Support Systems Marital Status: Divorced How Long?: 5 years Patient Roles: Spouse;Parent Spouse/Significant Other: Bob-[phone]-home  [phone]  back together and he assists pt with her care Children: Harriett Sine Smith-daughter  [phone]-cell [phone]-home Other Supports: Two grown son's Anticipated Caregiver: daughter and ex-husband Ability/Limitations of Caregiver: Can provide 24 hour care Caregiver Availability: 24/7 Family Dynamics: Daughter to stay with pt at pt's home in [place] at discharge.  Ex-husband to come and stay with both to provide care while daugther at work.  Pt reports they all get along well and stay  together frequently.  Social History Preferred language: English Religion: Non-Denominational Cultural Background: No issues Education: High School Read: Yes Write: Yes Employment Status: Retired Fish farm manager Issues: No issues Guardian/Conservator: None-according to MD pt is capable of making her own decisions   Abuse/Neglect Physical Abuse: Denies Verbal Abuse: Denies Sexual Abuse: Denies Exploitation of patient/patient's resources: Denies Self-Neglect: Denies  Emotional Status Pt's affect, behavior adn adjustment status: Pt is motivated to improve and do for herself.  She has always been able to take care of herself and she wants to remain doing this.  She is pleased with her progress since she came here.  her knee is giving her problems but was PTA. Recent Psychosocial Issues: Other medical issues-right knee-needs knee replacement Pyschiatric History: Hisotry anxiety-takes meds and feels this helps her function.  Depression screen score-3.  She feels she is dealing fairly well with her diagnosis and deficits.  Will continue to monitor her coping while here. Substance Abuse History: No issues  Patient / Family Perceptions, Expectations & Goals Pt/Family understanding of illness & functional limitations: Pt and daughter can explain her surgery and deficits.  Sh eis motivated to improve and recover from this.  She states: " Never in a million years did I think I would have this happen to me."  She is grateful for how well she is doing. Premorbid pt/family roles/activities: Wife, Mother, grandmother, home owner, Church member, etc Anticipated changes in roles/activities/participation: resume Pt/family expectations/goals: Pt states: " I want to take care of myself, but I do have family that will help me."  Daughter states; "  Me and my dad will help her if she needs help."  [employer]: None Premorbid Home Care/DME Agencies: Other (Comment)  (Has had before) Transportation available at discharge: Family Resource referrals recommended: Support group (specify) ([facility] Support group)  Discharge Planning Living Arrangements: Spouse/significant other;Children Support Systems: Children;Spouse/significant other;Friends/neighbors;Church/faith community Type of Residence: Private residence Insurance Resources: Administrator (specify) Chiropractor) Financial Resources: Restaurant manager, fast food Screen Referred: No Living Expenses: Lives with family Money Management: Patient;Spouse Do you have any problems obtaining your medications?: No Home Management: daughter does most of it-ex-husband cooks Patient/Family Preliminary Plans: Return home with ex-husband and daughter.  Ex-husband to be with during the day while daughter works.  Plan eventually to go back to [place] with ex-husband and his house, once rehab and follow up is finished. Social Work Anticipated Follow Up Needs: HH/OP;Support Group  Clinical Impression Pleasant talkative female who is motivated and ready to get better.  She is pleased with her progress thus far and hopeful she will make more.  She has 24 hour care at discharge Arranged and is anxious to return home.    Lucy Chris 04/05/2013, 2:30 PM

## 2013-04-05 NOTE — Progress Notes (Signed)
Patient information reviewed and entered into eRehab system by Levander Katzenstein, RN, CRRN, PPS Coordinator.  Information including medical coding and functional independence measure will be reviewed and updated through discharge.     Per nursing patient was given "Data Collection Information Summary for Patients in Inpatient Rehabilitation Facilities with attached "Privacy Act Statement-Health Care Records" upon admission.  

## 2013-04-05 NOTE — Progress Notes (Signed)
Speech Language Pathology Daily Session Note  Patient Details  Name: Meghan Yu MRN: [id number] Date of Birth: 1931/11/15  Today's Date: 04/05/2013 Time: 1030-1100 Time Calculation (min): 30 min  Short Term Goals: Week 1: SLP Short Term Goal 1 (Week 1): The patient will demonstrate functional problem solving with basic and familiar tasks with min A multimodal cueing.  SLP Short Term Goal 2 (Week 1): The patient will utilize external memory aids to recall information with min A multimodal cueing.   SLP Short Term Goal 3 (Week 1): Patient will identify two physical deficits that may hinder safety, with [facility] A questions cues.   Skilled Therapeutic Interventions: Skilled treatment session focused on addressing cognitive-linguistic goals.  SLP facilitated session with discussion regarding deficits post CVA; patient identified speech intelligibility as a result of decreased volume and being a little slower at things.  Verbally patient required Supervision-Increased wait time for problem solving and SLP plans to diagnostically assess functional problem solving tomorrow.     FIM:  Comprehension Comprehension Mode: Auditory Comprehension: 5-Follows basic conversation/direction: With extra time/assistive device Expression Expression Mode: Verbal Expression: 5-Expresses basic needs/ideas: With no assist Social Interaction Social Interaction: 6-Interacts appropriately with others with medication or extra time (anti-anxiety, antidepressant). Problem Solving Problem Solving: 4-Solves basic 75 - 89% of the time/requires cueing 10 - 24% of the time Memory Memory: 5-Recognizes or recalls 90% of the time/requires cueing < 10% of the time  Pain Pain Assessment Pain Assessment: No/denies pain  Therapy/Group: Individual Therapy  Meghan Yu., CCC-SLP [phone]  Meghan Yu 04/05/2013, 4:53 PM

## 2013-04-05 NOTE — Care Management Note (Signed)
Inpatient Rehabilitation Center Individual Statement of Services  Patient Name:  Meghan Yu  Date:  04/05/2013  Welcome to the Inpatient Rehabilitation Center.  Our goal is to provide you with an individualized program based on your diagnosis and situation, designed to meet your specific needs.  With this comprehensive rehabilitation program, you will be expected to participate in at least 3 hours of rehabilitation therapies Monday-Friday, with modified therapy programming on the weekends.  Your rehabilitation program will include the following services:  Physical Therapy (PT), Occupational Therapy (OT), Speech Therapy (ST), 24 hour per day rehabilitation nursing, Case Management ( Social Worker), Rehabilitation Medicine, Nutrition Services and Pharmacy Services  Weekly team conferences will be held on Wednesday to discuss your progress.  Your Social Worker will talk with you frequently to get your input and to update you on team discussions.  Team conferences with you and your family in attendance may also be held.  Expected length of stay: 10-14 days  Overall anticipated outcome: Supervision level  Depending on your progress and recovery, your program may change. Your Social Worker will coordinate services and will keep you informed of any changes. Your Child psychotherapist names and contact numbers are listed  below.  The following services may also be recommended but are not provided by the Inpatient Rehabilitation Center:   Driving Evaluations  Home Health Rehabiltiation Services  Outpatient Rehabilitatation Servives    Arrangements will be made to provide these services after discharge if needed.  Arrangements include referral to agencies that provide these services.  Your insurance has been verified to be:  Medicare & generic Commerical Your primary doctor is:  Dr Merlene Laughter  Pertinent information will be shared with your doctor and your insurance company.  Social Worker:  Dossie Der, [place] [phone]  Information discussed with and copy given to patient by: Lucy Chris, 04/05/2013, 1:02 PM

## 2013-04-05 NOTE — Progress Notes (Signed)
Physical Therapy Session Note  Patient Details  Name: Meghan Yu MRN: [id number] Date of Birth: November 06, 1931  Today's Date: 04/05/2013 Time: 0906-1003 Time Calculation (min): 57 min  Short Term Goals: Week 1:  PT Short Term Goal 1 (Week 1): Pt will peform bed mobility on flat bed, no rail with supervision PT Short Term Goal 2 (Week 1): Pt will peformed bed <> w/c transfers with min A  PT Short Term Goal 3 (Week 1): Pt will perform ambulation in controlled environment with LRAD and min A x 100' PT Short Term Goal 4 (Week 1): Pt will perform stair negotiation up and down 4 stairs with one rail and min A  Therapy Documentation Precautions:  Precautions Precautions: Fall Restrictions Weight Bearing Restrictions: No Pain: Pain Assessment Pain Assessment: No/denies pain Mobility:  Pt performed supine> sit from bed with HOB elevated and bed rail with min A and verbal cues to initiate.  Pt able to initiate and bring RLE to EOB.  Performed stand pivot bed > w/c without RW but required mod A for lifting assistance to stand and stabilize during pivot with UE support on bed and arm rests.   Locomotion : Ambulation Ambulation/Gait Assistance: 3: Mod assist Wheelchair Mobility Distance: 150  Balance: Standardized Balance Assessment Standardized Balance Assessment: Berg Balance Test Berg Balance Test Sit to Stand: Able to stand  independently using hands Standing Unsupported: Able to stand safely 2 minutes Sitting with Back Unsupported but Feet Supported on Floor or Stool: Able to sit safely and securely 2 minutes Stand to Sit: Sits safely with minimal use of hands Transfers: Needs one person to assist Standing Unsupported with Eyes Closed: Able to stand 10 seconds safely Standing Ubsupported with Feet Together: Able to place feet together independently and stand for 1 minute with supervision From Standing, Reach Forward with Outstretched Arm: Can reach forward >12 cm safely (5") From  Standing Position, Pick up Object from Floor: Able to pick up shoe, needs supervision From Standing Position, Turn to Look Behind Over each Shoulder: Turn sideways only but maintains balance Turn 360 Degrees: Needs assistance while turning Standing Unsupported, Alternately Place Feet on Step/Stool: Able to complete >2 steps/needs minimal assist Standing Unsupported, One Foot in Front: Able to take small step independently and hold 30 seconds Standing on One Leg: Unable to try or needs assist to prevent fall Total Score: 34 Patient demonstrates increased fall risk as noted by score of 346 on Berg Balance Scale.  (<36= high risk for falls, close to 100%; 37-45 significant >80%; 46-51 moderate >50%; 52-55 lower >25%) Discussed with patient falls risk and importance of calling for assistance for all transfers and use of RW with staff to minimize falls risk.  Pt verbalized understanding.  Also discussed areas of balance that therapy will focus on.   Other Treatments: Treatments Neuromuscular Facilitation: Right;Upper Extremity;Lower Extremity;Activity to increase motor control;Activity to increase timing and sequencing;Activity to increase lateral weight shifting;Activity to increase anterior-posterior weight shifting in standing with focus on forward lean and weight shift on R side and use of hip strategy for balance while reaching forwards and various directions out of BOS for horseshoe and use of LUE to placed horseshoe on tall target forwards and to the L to facilitate L lateral weight shifting and upright trunk posture and control.  Performed gait training without RW x 50' with manual and verbal cues to maintain upright trunk posture, L lateral weight shift for increased R foot clearance and step length.  With cues pt  presents with R lateral and anterior lean, decreased weight shift to L, R foot drag and forward LOB.  Returned to room and to rest in w/c prior to speech therapy.    See FIM for current  functional status  Therapy/Group: Individual Therapy  [name] [facility] 04/05/2013, 11:09 AM

## 2013-04-05 NOTE — Progress Notes (Signed)
Occupational Therapy Session Note  Patient Details  Name: Meghan Yu MRN: [id number] Date of Birth: 1932-09-01  Today's Date: 04/05/2013 Time: 1105-1200 and 4098-1191 Time Calculation (min): 55 min and 46 min  Short Term Goals: Week 1:  OT Short Term Goal 1 (Week 1): Pt will pick out her clothing with min cues OT Short Term Goal 2 (Week 1): Pt will perform basic transfers to toilet with close supervision OT Short Term Goal 3 (Week 1): Pt will initiate using the call bell to notifiy staff of toileting needs 50% of time OT Short Term Goal 4 (Week 1): Pt will perform bed mobility in prep for morning routine with supervision OT Short Term Goal 5 (Week 1): Pt will attend to right field with mod A in functional tasks.  Skilled Therapeutic Interventions/Progress Updates:    1) Pt seen for ADL retraining with focus on functional mobility, sit <> stand, and dynamic standing balance.  Pt requested to bathe at sink.  Pt continues to demonstrate significant anterior lean in standing with verbal and tactile cues to correct.  Engaged in functional mobility with HHA and tactile cues at Rt hip with scanning room to increase attention to Rt environment with pt attending to Rt environment with min cues.  2) Pt seen for 1:1 OT with focus on sit <> stand, standing balance, postural control, and attention to Rt environment.  Engaged in card scanning and sorting activity in standing with focus on pt scanning cards in moderately distracting environment.  Pt with decreased attention to Rt side, requiring increased time and min cues to locate cards in Rt visual field.  Pt with improved sit <> stand with initial cues for hand placement and increased control with descent in to chair.  Ambulated >150 feet with HHA and tactile cues at Rt hip for trunk control and increased posture.  Noted pt with Rt foot drag as she fatigued, pt reports "yeah that happens sometimes".    Therapy Documentation Precautions:   Precautions Precautions: Fall Restrictions Weight Bearing Restrictions: No Pain: Pain Assessment Pain Assessment: No/denies pain  See FIM for current functional status  Therapy/Group: Individual Therapy  Leonette Monarch 04/05/2013, 12:17 PM

## 2013-04-05 NOTE — Progress Notes (Addendum)
Patient ID: Meghan Yu, female   DOB: 1932/09/09, 79 y.o.   MRN: [id number] Subjective/Complaints: Slept well again. No problems in therapy yesterday nourinary symptoms Oriented to person place and day/date A 12 point review of systems has been performed and if not noted above is otherwise negative.   Objective: Vital Signs: Blood pressure 108/58, pulse 69, temperature 98.6 F (37 C), temperature source Oral, resp. rate 18, height 5\' 1"  (1.549 m), weight 55 kg (121 lb 4.1 oz), SpO2 97.00%. No results found.  Recent Labs  04/05/13 0531  WBC 8.9  HGB 10.7*  HCT 31.1*  PLT 193    Recent Labs  04/05/13 0531  NA 136  K 3.6  CL 100  GLUCOSE 151*  BUN 24*  CREATININE 0.58  CALCIUM 8.2*   CBG (last 3)   Recent Labs  04/04/13 1122 04/04/13 1625 04/04/13 2037  GLUCAP 112* 159* 128*    Wt Readings from Last 3 Encounters:  04/02/13 55 kg (121 lb 4.1 oz)  03/26/13 60.782 kg (134 lb)  03/26/13 60.782 kg (134 lb)       Physical Exam:  Constitutional: She is oriented to person, place, and time. She appears well-developed and well-nourished.  HENT:  Scalp incision clean and dry with staples and sutures in place.  Eyes: Pupils are equal, round, and reactive to light.  Neck: Normal range of motion.  Cardiovascular: Normal rate and regular rhythm.  Pulmonary/Chest: Effort normal and breath sounds normal. No respiratory distress.  Abdominal: Soft. Bowel sounds are normal. She exhibits no distension. There is no tenderness.  Musculoskeletal: She exhibits edema (right knee).  Neurological: She is alert and oriented to person, place, and time.  Flat affect, looks straight ahead but able to turn to left with cues. Denies dizziness. No nystagmus noted. Mild right facial weakness. No dysarthria. Voice is hoarse. Delayed output with measured movements. Able to follow basic commands. Oriented to self, place, situation, age/DOB. Decreased awareness of deficits. RUE 4/5. LUE 4+/5.  RLE is 4/5. LLE is 4+/5. Sensation intact to light touch and PP on the right. Decreased FMC of right arm and leg.  Skin: Skin is warm and dry. bruising along left neck as well as bilateral UE's.  motor strength 2 minus in the right deltoid, biceps, triceps, grip  3 minus the right hip flexors knee extensor 2 minus in ankle dorsiflexor plantar flexor  5/5 in the left deltoid, biceps, triceps, grip, hip flexor, knee extensors, ankle dorsiflexor plantar flexor  Sensation reduced to light touch in the right upper and right lower extremity normal in the left upper and left lower extremity  Psych:  A little slow to arouse this am  Assessment/Plan: 1. Functional deficits secondary to right frontal meningioma s/p resection which require 3+ hours per day of interdisciplinary therapy in a comprehensive inpatient rehab setting. Physiatrist is providing close team supervision and 24 hour management of active medical problems listed below. Physiatrist and rehab team continue to assess barriers to discharge/monitor patient progress toward functional and medical goals. FIM: FIM - Bathing Bathing Steps Patient Completed: Chest;Right Arm;Left Arm;Abdomen;Front perineal area;Right upper leg;Left upper leg Bathing: 0: Activity did not occur  FIM - Upper Body Dressing/Undressing Upper body dressing/undressing steps patient completed: Thread/unthread left sleeve of pullover shirt/dress;Thread/unthread right sleeve of pullover shirt/dresss;Pull shirt over trunk;Put head through opening of pull over shirt/dress Upper body dressing/undressing: 0: Activity did not occur FIM - Lower Body Dressing/Undressing Lower body dressing/undressing steps patient completed: Thread/unthread left underwear leg;Thread/unthread right pants  leg;Don/Doff left shoe;Fasten/unfasten left shoe Lower body dressing/undressing: 0: Activity did not occur  FIM - Toileting Toileting steps completed by patient: Adjust clothing prior to  toileting;Adjust clothing after toileting Toileting: 3: Mod-Patient completed 2 of 3 steps  FIM - Toilet Transfers Toilet Transfers: 4-To toilet/BSC: Min A (steadying Pt. > 75%)  FIM - Bed/Chair Transfer Bed/Chair Transfer: 3: Supine > Sit: Mod A (lifting assist/Pt. 50-74%/lift 2 legs;4: Sit > Supine: Min A (steadying pt. > 75%/lift 1 leg);4: Bed > Chair or W/C: Min A (steadying Pt. > 75%);4: Chair or W/C > Bed: Min A (steadying Pt. > 75%)  FIM - Locomotion: Wheelchair Locomotion: Wheelchair: 0: Activity did not occur FIM - Locomotion: Ambulation Locomotion: Ambulation Assistive Devices: Designer, industrial/product Ambulation/Gait Assistance: 4: Min assist Locomotion: Ambulation: 0: Activity did not occur (Went to bathroom with walker)  Comprehension Comprehension Mode: Auditory Comprehension: 5-Follows basic conversation/direction: With extra time/assistive device  Expression Expression Mode: Verbal Expression: 5-Expresses basic needs/ideas: With no assist  Social Interaction Social Interaction: 6-Interacts appropriately with others with medication or extra time (anti-anxiety, antidepressant).  Problem Solving Problem Solving: 4-Solves basic 75 - 89% of the time/requires cueing 10 - 24% of the time  Memory Memory: 5-Recognizes or recalls 90% of the time/requires cueing < 10% of the time Medical Problem List and Plan:  1. DVT Prophylaxis/Anticoagulation: Mechanical: Sequential compression devices, below knee Bilateral lower extremities  2. Pain Management: Headaches resolved. Use tylenol prn  3. Mood: Anxiety seems to be controlled except at night. Resumed home hs dose of xanax.  -monitor clinically for am sedation--none at present 4. Neuropsych: This patient is capable of making decisions on her own behalf.  5. Steroid induced hyperglycemia: Will monitor BS with AC/HS checks. Use SSI as needed for elevated BS.   -wean steroids 6. ABLA: Will recheck on Monday.  7. Constipation: Resolved  with miralax. Will back off on laxatives due to history of bowel urgency--use prn.  8. Hypothyroid: continue supplement 9.  UTI vs colonization- will treat secondary to immunocompromise from steroids  LOS (Days) 3 A FACE TO FACE EVALUATION WAS PERFORMED  Erick Colace 04/05/2013 7:23 AM

## 2013-04-06 ENCOUNTER — Inpatient Hospital Stay (HOSPITAL_COMMUNITY): Payer: Medicare Other | Admitting: Speech Pathology

## 2013-04-06 ENCOUNTER — Inpatient Hospital Stay (HOSPITAL_COMMUNITY): Payer: PRIVATE HEALTH INSURANCE | Admitting: Occupational Therapy

## 2013-04-06 ENCOUNTER — Inpatient Hospital Stay (HOSPITAL_COMMUNITY): Payer: Medicare Other | Admitting: Occupational Therapy

## 2013-04-06 ENCOUNTER — Inpatient Hospital Stay (HOSPITAL_COMMUNITY): Payer: Medicare Other | Admitting: Physical Therapy

## 2013-04-06 ENCOUNTER — Inpatient Hospital Stay (HOSPITAL_COMMUNITY): Payer: PRIVATE HEALTH INSURANCE | Admitting: *Deleted

## 2013-04-06 LAB — GLUCOSE, CAPILLARY
Glucose-Capillary: 137 mg/dL — ABNORMAL HIGH (ref 70–99)
Glucose-Capillary: 142 mg/dL — ABNORMAL HIGH (ref 70–99)
Glucose-Capillary: 113 mg/dL — ABNORMAL HIGH (ref 70–99)

## 2013-04-06 NOTE — Progress Notes (Signed)
Occupational Therapy Session Note  Patient Details  Name: Meghan Yu MRN: [id number] Date of Birth: 12-22-1931  Today's Date: 04/06/2013 Time: 1115-1200 Time Calculation (min): 45 min  Short Term Goals: Week 1:  OT Short Term Goal 1 (Week 1): Pt will pick out her clothing with min cues OT Short Term Goal 2 (Week 1): Pt will perform basic transfers to toilet with close supervision OT Short Term Goal 3 (Week 1): Pt will initiate using the call bell to notifiy staff of toileting needs 50% of time OT Short Term Goal 4 (Week 1): Pt will perform bed mobility in prep for morning routine with supervision OT Short Term Goal 5 (Week 1): Pt will attend to right field with mod A in functional tasks.  Skilled Therapeutic Interventions/Progress Updates:    Pt seen for ADL retraining with focus on functional mobility, sit <> stand, and dynamic standing balance.  Pt on toilet upon arrival, completed hygiene and clothing management with supervision and increased time. Pt requested to bathe at sink; sit <> stand level with increased time and min cues for sequencing. Pt had one instance of getting stuck during self-care and unable to continue on with bathing task, requiring question cues to initiate and sequence.  Therapy Documentation Precautions:  Precautions Precautions: Fall Restrictions Weight Bearing Restrictions: No Pain: Pain Assessment Pain Assessment: No/denies pain  See FIM for current functional status  Therapy/Group: Individual Therapy  Leonette Monarch 04/06/2013, 12:09 PM

## 2013-04-06 NOTE — Progress Notes (Signed)
Physical Therapy Session Note  Patient Details  Name: Meghan Yu MRN: [id number] Date of Birth: [birth year]  Today's Date: 04/06/2013 Time: 0902-1004 and 1600-1630  Time Calculation (min): 62 min and 30 min  Short Term Goals: Week 1:  PT Short Term Goal 1 (Week 1): Pt will peform bed mobility on flat bed, no rail with supervision PT Short Term Goal 2 (Week 1): Pt will peformed bed <> w/c transfers with min A  PT Short Term Goal 3 (Week 1): Pt will perform ambulation in controlled environment with LRAD and min A x 100' PT Short Term Goal 4 (Week 1): Pt will perform stair negotiation up and down 4 stairs with one rail and min A  Skilled Therapeutic Interventions/Progress Updates:   Pt finishing breakfast; Pt continues to require increased time for functional mobility and activities.  Performed supine > sit with HOB elevated and bed rails with supervision.  Performed sit > stand from bed and transfer to w/c with min-mod A without AD but with assistance for weight shifting and verbal cues for RLE advancement.  In gym focused on initiation, attention to task, dynamic standing balance and endurance, functional use of RUE, weight shifting and upright trunk posture during functional activity of folding multiple towels at tall table but reaching for towel in various directions out of BOS and incorporating rotation and folding at table.  Pt able to stand and complete task in 8-10 minutes with close supervision and no LOB.  Also performed functional task of reaching and placing towels on clothes line with focus on dynamic standing balance, endurance, attention to task, initiation, RUE strength and motor coordination, lateral stepping and reaching up and out of BOS for postural control and balance.  Able to remain standing x 12 minutes with one LOB posterior with max A to correct.  Performed RLE foot taps to 4" step with focus on upright posture and L lateral weight shifting and maintaining balance with R foot  up on step; as pt fatigued she began to experience increased freezing moments and increased posterior and R lateral LOB with inability to self correct and required total A to return to standing.  While attempting to perform retro stepping to w/c pt experienced another freezing moment and unable to continue stepping; pt again lost balance while attempting to sit in w/c.  Returned to room for TR evaluation.     PM session:  Pt resting in bed.  Pt requesting to use toilet.  Performed supine > sit EOB supervision.  Performed transfer bed > toilet with RW and min A with verbal and tactile cues to maintain upright posture, safe distance to RW and safety with RW in smaller spaces and when turning.  Performed toileting tasks with supervision-min A for balance when standing without UE support.  Also performed hand washing at sink without UE support supervision.  Performed gait training with RW x 150' x 2 reps with min-mod A overall with tactile and verbal cues to maintain upright trunk, L lateral weight shift, attention to R environment secondary to veering to R and running into objects, and full RLE step length and foot clearance.  In gym performed LE extensor strengthening and weight shift training on KINETRON at 40-50 cm/sec in standing with bilat UE support with verbal, visual and tactile cues to maintain upright trunk and for full lateral weight shifting; one sitting rest break required.  Pt began to c/o R knee OA pain, ceased Kinetron.  Performed stairs with bilat UE support with  focus on L lateral weight shift and full RLE clearance by performing step to sequence leading with RLE each step to ascend and descend x 5 steps x 2 reps with min A.  Returned to room and to supine with min A to rest before dinner.    Therapy Documentation Precautions:  Precautions Precautions: Fall Restrictions Weight Bearing Restrictions: No Pain: Pain Assessment Pain Assessment: No/denies pain Pain Score: 0-No pain Locomotion  : Ambulation Ambulation/Gait Assistance: 3: Mod assist   See FIM for current functional status  Therapy/Group: Individual Therapy  [name] [facility] 04/06/2013, 12:31 PM

## 2013-04-06 NOTE — Progress Notes (Signed)
Occupational Therapy Session Note  Patient Details  Name: Meghan Yu MRN: [id number] Date of Birth: [birth year]  Today's Date: 04/06/2013 Time: 4098-1191 Time Calculation (min): 30 min  Skilled Therapeutic Interventions/Progress Updates:    Pt worked on toilet transfers and toileting during session.  Pt able to ambulate to the bathroom using her RW with min guard assist.  She was able to manage her clothing and perform her own hygiene with min steady assist as well.  [name] also ambulated to the sink with the RW to wash her hands.  At conclusion of these activities continued focus on dynamic standing balance while performing mobility without use of assistive device and min steady assist as well.  In static standing had pt close her eyes with her feet apart and she was able to balance for greater than 8 seconds but would spontaneously open her eyes.  Noted most frequent LOB occurred when standing with feet apart but with RLE in front of the left.  Pt needing constant min assist to maintain balance with frequent LOB to the right.  When standing with the left foot in front of the right she did not exhibit as frequent LOB.  Placed pt in bed with bed alarm on at end of session.  Therapy Documentation Precautions:  Precautions Precautions: Fall Restrictions Weight Bearing Restrictions: No  Vital Signs: Therapy Vitals Temp: 97.5 F (36.4 C) Temp src: Oral Pulse Rate: 80 Resp: 18 BP: 119/72 mmHg Patient Position, if appropriate: Lying Oxygen Therapy SpO2: 97 % O2 Device: None (Room air) Pain: Pain Assessment Pain Assessment: No/denies pain Pain Score: 0-No pain  Therapy/Group: Individual Therapy  Alazne Quant OTR/L 04/06/2013, 4:08 PM

## 2013-04-06 NOTE — Progress Notes (Signed)
Patient ID: Meghan Yu, female   DOB: [birth year], 79 y.o.   MRN: [id number] Subjective/Complaints: No headache, used a blue pill for" hot flashes" at home No urinary symptoms Oriented to person place and day/date A 12 point review of systems has been performed and if not noted above is otherwise negative.   Objective: Vital Signs: Blood pressure 134/56, pulse 82, temperature 97.7 F (36.5 C), temperature source Oral, resp. rate 17, height 5\' 1"  (1.549 m), weight 55 kg (121 lb 4.1 oz), SpO2 97.00%. No results found.  Recent Labs  04/05/13 0531  WBC 8.9  HGB 10.7*  HCT 31.1*  PLT 193    Recent Labs  04/05/13 0531  NA 136  K 3.6  CL 100  GLUCOSE 151*  BUN 24*  CREATININE 0.58  CALCIUM 8.2*   CBG (last 3)   Recent Labs  04/05/13 1613 04/05/13 2034 04/06/13 0730  GLUCAP 130* 140* 137*    Wt Readings from Last 3 Encounters:  04/02/13 55 kg (121 lb 4.1 oz)  03/26/13 60.782 kg (134 lb)  03/26/13 60.782 kg (134 lb)       Physical Exam:  Constitutional: She is oriented to person, place, and time. She appears well-developed and well-nourished.  HENT:  Scalp incision clean and dry with staples and sutures in place.  Eyes: Pupils are equal, round, and reactive to light.  Neck: Normal range of motion.  Cardiovascular: Normal rate and regular rhythm.  Pulmonary/Chest: Effort normal and breath sounds normal. No respiratory distress.  Abdominal: Soft. Bowel sounds are normal. She exhibits no distension. There is no tenderness.  Musculoskeletal: She exhibits edema (right knee).  Neurological: She is alert and oriented to person, place, and time.  Flat affect, looks straight ahead but able to turn to left with cues. Denies dizziness. No nystagmus noted. Mild right facial weakness. No dysarthria. Voice is hoarse. Delayed output with measured movements. Able to follow basic commands. Oriented to self, place, situation, age/DOB. Decreased awareness of deficits. RUE 4/5. LUE  4+/5. RLE is 4/5. LLE is 4+/5. Sensation intact to light touch and PP on the right. Decreased FMC of right arm and leg.  Skin: Skin is warm and dry. bruising along left neck as well as bilateral UE's.  motor strength 2 minus in the right deltoid, biceps, triceps, grip  3 minus the right hip flexors knee extensor 2 minus in ankle dorsiflexor plantar flexor  5/5 in the left deltoid, biceps, triceps, grip, hip flexor, knee extensors, ankle dorsiflexor plantar flexor  Sensation reduced to light touch in the right upper and right lower extremity normal in the left upper and left lower extremity  Psych:  A little slow to arouse this am  Assessment/Plan: 1. Functional deficits secondary to right frontal meningioma s/p resection which require 3+ hours per day of interdisciplinary therapy in a comprehensive inpatient rehab setting. Physiatrist is providing close team supervision and 24 hour management of active medical problems listed below. Physiatrist and rehab team continue to assess barriers to discharge/monitor patient progress toward functional and medical goals. FIM: FIM - Bathing Bathing Steps Patient Completed: Chest;Right Arm;Left Arm;Abdomen;Right upper leg;Left upper leg Bathing: 3: Mod-Patient completes 5-7 35f 10 parts or 50-74%  FIM - Upper Body Dressing/Undressing Upper body dressing/undressing steps patient completed: Thread/unthread left sleeve of pullover shirt/dress;Thread/unthread right sleeve of pullover shirt/dresss;Pull shirt over trunk;Put head through opening of pull over shirt/dress Upper body dressing/undressing: 5: Set-up assist to: Obtain clothing/put away FIM - Lower Body Dressing/Undressing Lower body dressing/undressing steps  patient completed: Thread/unthread right pants leg;Thread/unthread left pants leg;Pull pants up/down Lower body dressing/undressing: 3: Mod-Patient completed 50-74% of tasks  FIM - Toileting Toileting steps completed by patient: Performs perineal  hygiene Toileting Assistive Devices: Grab bar or rail for support Toileting: 2: Max-Patient completed 1 of 3 steps  FIM - Diplomatic Services operational officer Devices: Grab bars;Walker Toilet Transfers: 4-To toilet/BSC: Min A (steadying Pt. > 75%);4-From toilet/BSC: Min A (steadying Pt. > 75%)  FIM - Bed/Chair Transfer Bed/Chair Transfer Assistive Devices: Arm rests;Walker Bed/Chair Transfer: 4: Sit > Supine: Min A (steadying pt. > 75%/lift 1 leg);4: Supine > Sit: Min A (steadying Pt. > 75%/lift 1 leg);4: Chair or W/C > Bed: Min A (steadying Pt. > 75%);4: Bed > Chair or W/C: Min A (steadying Pt. > 75%)  FIM - Locomotion: Wheelchair Distance: 150 Locomotion: Wheelchair: 1: Total Assistance/staff pushes wheelchair (Pt<25%) FIM - Locomotion: Ambulation Locomotion: Ambulation Assistive Devices: Other (comment) (none) Ambulation/Gait Assistance: 3: Mod assist Locomotion: Ambulation: 2: Travels 50 - 149 ft with moderate assistance (Pt: 50 - 74%)  Comprehension Comprehension Mode: Auditory Comprehension: 5-Follows basic conversation/direction: With extra time/assistive device  Expression Expression Mode: Verbal Expression: 5-Expresses basic needs/ideas: With no assist  Social Interaction Social Interaction: 6-Interacts appropriately with others with medication or extra time (anti-anxiety, antidepressant).  Problem Solving Problem Solving: 4-Solves basic 75 - 89% of the time/requires cueing 10 - 24% of the time  Memory Memory: 5-Recognizes or recalls 90% of the time/requires cueing < 10% of the time Medical Problem List and Plan:  1. DVT Prophylaxis/Anticoagulation: Mechanical: Sequential compression devices, below knee Bilateral lower extremities  2. Pain Management: Headaches resolved. Use tylenol prn  3. Mood: Anxiety seems to be controlled except at night. Resumed home hs dose of xanax.  -monitor clinically for am sedation--none at present 4. Neuropsych: This patient is  capable of making decisions on her own behalf.  5. Steroid induced hyperglycemia: Will monitor BS with AC/HS checks. Use SSI as needed for elevated BS.   -wean steroids 6. ABLA: Will recheck on Monday.  7. Constipation: Resolved with miralax. Will back off on laxatives due to history of bowel urgency--use prn.  8. Hypothyroid: continue supplement 9.  UTI vs colonization- will treat secondary to immunocompromise from steroids  LOS (Days) 4 A FACE TO FACE EVALUATION WAS PERFORMED  Claudette Laws E 04/06/2013 8:45 AM

## 2013-04-06 NOTE — Progress Notes (Signed)
Speech Language Pathology Daily Session Note  Patient Details  Name: Meghan Yu MRN: [id number] Date of Birth: 1932-07-28  Today's Date: 04/06/2013 Time: 0835-0900 Time Calculation (min): 25 min  Short Term Goals: Week 1: SLP Short Term Goal 1 (Week 1): The patient will demonstrate functional problem solving with basic and familiar tasks with min A multimodal cueing.  SLP Short Term Goal 2 (Week 1): The patient will utilize external memory aids to recall information with min A multimodal cueing.   SLP Short Term Goal 3 (Week 1): Patient will identify two physical deficits that may hinder safety, with [facility] A questions cues.   Skilled Therapeutic Interventions: Skilled treatment session focused on addressing cognitive-linguistic goals.  Upon entering room patient reclined in bed consuming breakfast.  SLP facilitated session with Supervision level verbal cues to problem solve raising head of bed and placing dentures prior to initiation of eating.  Patient demonstrated appropriate initiation and increased wait time during meal set-up.     FIM:  Comprehension Comprehension Mode: Auditory Comprehension: 5-Follows basic conversation/direction: With extra time/assistive device Expression Expression Mode: Verbal Expression: 5-Expresses basic needs/ideas: With no assist Social Interaction Social Interaction: 6-Interacts appropriately with others with medication or extra time (anti-anxiety, antidepressant). Problem Solving Problem Solving: 4-Solves basic 75 - 89% of the time/requires cueing 10 - 24% of the time Memory Memory: 5-Recognizes or recalls 90% of the time/requires cueing < 10% of the time FIM - Eating Eating Activity: 5: Set-up assist for open containers  Pain Pain Assessment Pain Assessment: No/denies pain  Therapy/Group: Individual Therapy  Charlane Ferretti., CCC-SLP [phone]  Meghan Yu 04/06/2013, 9:00 AM

## 2013-04-07 ENCOUNTER — Inpatient Hospital Stay (HOSPITAL_COMMUNITY): Payer: Medicare Other | Admitting: Occupational Therapy

## 2013-04-07 ENCOUNTER — Inpatient Hospital Stay (HOSPITAL_COMMUNITY): Payer: Medicare Other | Admitting: Speech Pathology

## 2013-04-07 ENCOUNTER — Inpatient Hospital Stay (HOSPITAL_COMMUNITY): Payer: Medicare Other | Admitting: Physical Therapy

## 2013-04-07 LAB — GLUCOSE, CAPILLARY
Glucose-Capillary: 130 mg/dL — ABNORMAL HIGH (ref 70–99)
Glucose-Capillary: 118 mg/dL — ABNORMAL HIGH (ref 70–99)
Glucose-Capillary: 129 mg/dL — ABNORMAL HIGH (ref 70–99)
Glucose-Capillary: 131 mg/dL — ABNORMAL HIGH (ref 70–99)

## 2013-04-07 LAB — BASIC METABOLIC PANEL
Calcium: 8.7 mg/dL (ref 8.4–10.5)
Creatinine, Ser: 0.58 mg/dL (ref 0.50–1.10)
GFR calc non Af Amer: 85 mL/min — ABNORMAL LOW (ref >90)
Sodium: 135 mEq/L (ref 135–145)

## 2013-04-07 MED ORDER — GLUCERNA SHAKE PO LIQD
237.0000 mL | ORAL | Status: DC
  Administered 2013-04-07 – 2013-04-14 (×7): 237 mL via ORAL

## 2013-04-07 MED ORDER — ADULT MULTIVITAMIN W/MINERALS CH
1.0000 | ORAL_TABLET | Freq: Every day | ORAL | Status: DC
  Administered 2013-04-07 – 2013-04-15 (×9): 1 via ORAL
  Filled 2013-04-07 (×10): qty 1

## 2013-04-07 NOTE — Progress Notes (Signed)
Occupational Therapy Session Note  Patient Details  Name: Meghan Yu MRN: [id number] Date of Birth: 06-20-1932  Today's Date: 04/07/2013 Time: 1100-1200 Time Calculation (min): 60 min  Short Term Goals: Week 1:  OT Short Term Goal 1 (Week 1): Pt will pick out her clothing with min cues OT Short Term Goal 2 (Week 1): Pt will perform basic transfers to toilet with close supervision OT Short Term Goal 3 (Week 1): Pt will initiate using the call bell to notifiy staff of toileting needs 50% of time OT Short Term Goal 4 (Week 1): Pt will perform bed mobility in prep for morning routine with supervision OT Short Term Goal 5 (Week 1): Pt will attend to right field with mod A in functional tasks.  Skilled Therapeutic Interventions/Progress Updates:  Self care retraining to include sponge bath (declined shower today), dress, groom and toileting.  Patient reports that since her brain surgery she has not noticed any changes in her thinking related to problem solving, safety/judgement memory, concentration.  During dressing, patient donned her socks and right shoe before her pants then verbalized, "I guess I should have put on my pants before my shoe".  Patient required extra time to complete BADL tasks and vc regarding problem solving when donning her shoes was difficult.  Patient with 1 LOB and min assist to recover when backing out of bathroom with RW (small ledge).  Therapy Documentation Precautions:  Precautions Precautions: Fall Restrictions Weight Bearing Restrictions: No Pain: Pain Assessment Pain Assessment: No/denies pain ADL: See FIM for current functional status  Therapy/Group: Individual Therapy  Notnamed Scholz 04/07/2013, 11:27 AM

## 2013-04-07 NOTE — Progress Notes (Signed)
Physical Therapy Session Note  Patient Details  Name: Meghan Yu MRN: [id number] Date of Birth: July 17, 1932  Today's Date: 04/07/2013 Time: 0805-0907 Time Calculation (min): 62 min  Short Term Goals: Week 1:  PT Short Term Goal 1 (Week 1): Pt will peform bed mobility on flat bed, no rail with supervision PT Short Term Goal 2 (Week 1): Pt will peformed bed <> w/c transfers with min A  PT Short Term Goal 3 (Week 1): Pt will perform ambulation in controlled environment with LRAD and min A x 100' PT Short Term Goal 4 (Week 1): Pt will perform stair negotiation up and down 4 stairs with one rail and min A  Skilled Therapeutic Interventions/Progress Updates:   Pt eating breakfast and receiving medication from RN upon entering.  While pt eating educated patient on purpose of conference, goals for D/C, equipment needs for D/C, f/u therapy and having family come and go through family education with therapy and pt prior to D/C home.  Pt verbalized agreement.  Pt transferred to EOB to finish eating breakfast and continue to focus on postural control and sitting balance; pt able to sit EOB for prolonged period of time with supervision and intermittent tactile cues for upright posture and R trunk shortening and L trunk elongation to bring head and trunk in midline over BOS.  Performed transfer to w/c with min A during pivoting with UE support on arm rests.  Assessment of gait with SPC x 150' in controlled environment with mod A and initially hand over hand for sequencing of two step gait sequence and advancing SPC with RLE; still requires mod A to maintain upright posture, L lateral weight shift and balance.  Performed stair negotiation training for home entry and exit up and down 4 stairs x 2 reps with L rail and min A with verbal cues for safe step to sequencing and assistance for full anterior weight shifting onto RLE when descending.  Also discussed transportation options upon D/C.  Performed simulated low  car transfer with SPC and min A; pt able to place LE into and out of car with supervision.  Supervision-min A to stand from low car and pivot back to w/c.  Returned to room and to remain up in w/c until B&D.    Therapy Documentation Precautions:  Precautions Precautions: Fall Restrictions Weight Bearing Restrictions: No Pain: Pain Assessment Pain Assessment: No/denies pain Locomotion : Ambulation Ambulation/Gait Assistance: 3: Mod assist;4: Min assist   See FIM for current functional status  Therapy/Group: Individual Therapy  [name] [facility] 04/07/2013, 10:43 AM

## 2013-04-07 NOTE — Progress Notes (Signed)
Recreational Therapy Session Note  Patient Details  Name: Meghan Yu MRN: [id number] Date of Birth: 01-29-1932 Today's Date: 04/07/2013  Pt participated in animal assisted activity/therapy seated w/c level with supervision. Rebecah Dangerfield 04/07/2013, 5:51 PM

## 2013-04-07 NOTE — Patient Care Conference (Signed)
Inpatient RehabilitationTeam Conference and Plan of Care Update Date: 04/07/2013   Time: 10;30 Am    Patient Name: Meghan Yu      Medical Record Number: [id number]  Date of Birth: 1932/09/14 Sex: Female         Room/Bed: 4037/4037-01 Payor Info: Payor: MEDICARE / Plan: MEDICARE PART A AND B / Product Type: *No Product type* /    Admitting Diagnosis: L FONTAL CRANI MENINGIOMA  Admit Date/Time:  04/02/2013 11:56 AM Admission Comments: No comment available   Primary Diagnosis:  Brain tumor Principal Problem: Brain tumor  Patient Active Problem List   Diagnosis Date Noted  . Hyperglycemia--steroid induced 04/05/2013  . Dehydration 04/05/2013  . E. coli UTI 04/05/2013  . Brain tumor 03/26/2013  . Hypothyroidism 03/26/2013  . Hyperlipidemia 03/26/2013    Expected Discharge Date: Expected Discharge Date: 04/15/13  Team Members Present: Physician leading conference: Dr. Claudette Laws Social Worker Present: Dossie Der, LCSW Nurse Present: Laural Roes, RN PT Present: [name], PT;Caroline Adriana Simas, PT;Other (comment) Clarisse Gouge Ripa-PT) OT Present: Other (comment);Leonette Monarch, Felipa Eth, OT (Kayla Perkinson-OT) SLP Present: Fae Pippin, SLP Other (Discipline and Name): Charolette Child     Current Status/Progress Goal Weekly Team Focus  Medical   Headache resolved, appetite improved, fine motor deficits right side.  Maximize independence  Neuromuscular reeducation on the right., Improved endurance   Bowel/Bladder   continent of bladder, can have urgency/occasional incontinence at night, keflex for uti  remain continent of bladder  timed toileting q2-3 prn   Swallow/Nutrition/ Hydration     na        ADL's   min assist bathing, supervision UB dressing, mod assist LB dressing, min assist transfers and mobility  supervision overall  initiation, sequencing, postural control, standing balance, right attention   Mobility   min-mod A with intermittent total  A needed  for balance during freezing moments  supervision overall  initiation, postural control, balance, gait   Communication   supervision   supervision   increase initiation    Safety/Cognition/ Behavioral Observations  Mod assist  Min assist   increase awareness, self-monitoring and correcting   Pain   denies pain  free of pain  monitor for pain   Skin   Bruising to L hand. incision to Right side and staples to Left side of head  no skin breakdown  monitor skin q shift prn      *See Care Plan and progress notes for long and short-term goals.  Barriers to Discharge: See above    Possible Resolutions to Barriers:  See above    Discharge Planning/Teaching Needs:  Home with daughter and ex-husband to assist-will have 24 hour care      Team Discussion:  Timed tolieting-working on cont Fine motor deficits, overall doing well.  Monitoring skin-fragile  Revisions to Treatment Plan:  None   Continued Need for Acute Rehabilitation Level of Care: The patient requires daily medical management by a physician with specialized training in physical medicine and rehabilitation for the following conditions: Daily direction of a multidisciplinary physical rehabilitation program to ensure safe treatment while eliciting the highest outcome that is of practical value to the patient.: Yes Daily medical management of patient stability for increased activity during participation in an intensive rehabilitation regime.: Yes Daily analysis of laboratory values and/or radiology reports with any subsequent need for medication adjustment of medical intervention for : Neurological problems  [name], Lemar Livings 04/07/2013, 2:50 PM

## 2013-04-07 NOTE — Progress Notes (Signed)
INITIAL NUTRITION ASSESSMENT  DOCUMENTATION CODES Per approved criteria  -Not Applicable   INTERVENTION: 1. Glucerna Shake po daily, each supplement provides 220 kcal and 10 grams of protein. 2. MVI daily 3. RD to continue to follow nutrition care plan.  NUTRITION DIAGNOSIS: Increased nutrient needs related to healing and participation in rehab therapies as evidenced by estimated needs.   Goal: Intake to meet >90% of estimated nutrition needs.  Monitor:  weight trends, lab trends, I/O's, PO intake, supplement tolerance  Reason for Assessment: Health History  79 y.o. female  Admitting Dx: Brain tumor  ASSESSMENT: Admitted 6/6 with lethargy and R-sided weakness. CT head revealed large left hemispheric frontal and posterior frontal extra-axial mass likely meningioma with significant vasogenic edema and 1 cm left to right shift. On 6/10 underwent L-frontal craniotomy with gross total resection of tumor.   Eating 100% of meals. We reviewed how the Carbohydrate Modified Medium (1600 - 2000) diet order works and we discussed distribution of carbohydrate grams throughout the day.  Pt reports that her appetite is doing better. Confirms weight loss; usually weighs 135 lb and is now down to 121 lb. This is a weight change of 10% x 2 weeks and is significant. Suspect some level of malnutrition given this weight loss - pt unable to provide specific details about PO intake. Unable to complete nutrition focused physical assessment at this time.  Height: Ht Readings from Last 1 Encounters:  04/02/13 5\' 1"  (1.549 m)    Weight: Wt Readings from Last 1 Encounters:  04/02/13 121 lb 4.1 oz (55 kg)    Ideal Body Weight: 105 lb  % Ideal Body Weight: 115%  Wt Readings from Last 10 Encounters:  04/02/13 121 lb 4.1 oz (55 kg)  03/26/13 134 lb (60.782 kg)  03/26/13 134 lb (60.782 kg)    Usual Body Weight: 134 lb  % Usual Body Weight: 90%  BMI:  Body mass index is 22.92 kg/(m^2).  WNL  Estimated Nutritional Needs: Kcal: 1300 - 1500 Protein: 69 - 80 g Fluid: 1.3 - 1.6 liters daily  Skin: incision of head  Diet Order: Carb Control Medium  EDUCATION NEEDS: -Education needs addressed   Intake/Output Summary (Last 24 hours) at 04/07/13 1550 Last data filed at 04/07/13 1255  Gross per 24 hour  Intake    720 ml  Output      1 ml  Net    719 ml    Last BM: 6/18   Labs:   Recent Labs Lab 04/05/13 0531 04/07/13 0630  NA 136 135  K 3.6 4.1  CL 100 101  CO2 28 29  BUN 24* 19  CREATININE 0.58 0.58  CALCIUM 8.2* 8.7  GLUCOSE 151* 132*    CBG (last 3)   Recent Labs  04/06/13 2113 04/07/13 0734 04/07/13 1202  GLUCAP 130* 118* 136*    Scheduled Meds: . ALPRAZolam  2 mg Oral QHS  . cephALEXin  250 mg Oral Q8H  . dexamethasone  2 mg Oral Q12H  . insulin aspart  0-9 Units Subcutaneous TID WC  . levETIRAcetam  500 mg Oral BID  . levothyroxine  75 mcg Oral QAC breakfast  . magnesium oxide  200 mg Oral Daily  . simvastatin  20 mg Oral Q supper    Continuous Infusions:   Past Medical History  Diagnosis Date  . Hypothyroidism   . Hyperlipidemia   . Anxiety disorder   . Arthritis of knee, right     Needs replacement/Dr. Lajoyce Corners  .  Colon polyps     colonoscopy by Dr. Laural Benes  . Degenerative arthritis of hand     bilateral    Past Surgical History  Procedure Laterality Date  . Knee arthroscopy    . Cholecystectomy    . Abdominal hysterectomy    . Craniotomy Left 03/30/2013    Procedure: LEFT FRONTAL CRANIOTOMY FOR RESECTION OF MENINGIOMA;  Surgeon: Barnett Abu, MD;  Location: [facility];  Service: Neurosurgery;  Laterality: Left;  Left Frontal Craniotomy for tumor excision    Jarold Motto MS, RD, LDN Pager: [phone] After-hours pager: [phone]

## 2013-04-07 NOTE — Progress Notes (Signed)
Physical Therapy Note  Patient Details  Name: Meghan Yu MRN: [id number] Date of Birth: [birth year] Today's Date: 04/07/2013  Time: 1400-1443 43 minutes  1:1 No c/o pain.  Gait with RW in controlled environment with close supervision, cuing to remain close to RW and for safety with turning and obstacle negotiation.  Obstacle course with RW with min A for tight turns and stepping over objects, cues for safety throughout.  Attempt gait training with SPC, pt requires mod A and manual facilitation for wt shifts and balance reactions.  Standing balance training with focus on hip and core control for standing balance without UE support for reaching, tapping up and static stance with foot on 4'' step.  Pt requires min A for balance due to delayed balance reactions.   Meghan Yu 04/07/2013, 2:43 PM

## 2013-04-07 NOTE — Progress Notes (Signed)
Speech Language Pathology Daily Session Note  Patient Details  Name: Meghan Yu MRN: [id number] Date of Birth: 12-28-1931  Today's Date: 04/07/2013 Time: 1500-1530 Time Calculation (min): 30 min  Short Term Goals: Week 1: SLP Short Term Goal 1 (Week 1): The patient will demonstrate functional problem solving with basic and familiar tasks with min A multimodal cueing.  SLP Short Term Goal 2 (Week 1): The patient will utilize external memory aids to recall information with min A multimodal cueing.   SLP Short Term Goal 3 (Week 1): Patient will identify two physical deficits that may hinder safety, with [facility] A questions cues.   Skilled Therapeutic Interventions: Skilled treatment session focused on addressing cognitive-linguistic goals.  SLP facilitated session with menu task that required patient to recall day of the week and solve basic problems such as including all items including drink while writing her order with Supervision level verbal cues and increased wait time.  SLP also facilitated session with Mod assist semantic cues to complete a categorical naming task due to attentional and retrieval deficits. Patient and SLP unable to complete task and it was left with patient for her to attempt to complete.  Continue with current plan of care.   FIM:  Comprehension Comprehension Mode: Auditory Comprehension: 5-Follows basic conversation/direction: With extra time/assistive device Expression Expression Mode: Verbal Expression: 5-Expresses basic needs/ideas: With extra time/assistive device Social Interaction Social Interaction: 4-Interacts appropriately 75 - 89% of the time - Needs redirection for appropriate language or to initiate interaction. Problem Solving Problem Solving: 4-Solves basic 75 - 89% of the time/requires cueing 10 - 24% of the time Memory Memory: 4-Recognizes or recalls 75 - 89% of the time/requires cueing 10 - 24% of the time  Pain Pain Assessment Pain Assessment:  No/denies pain  Therapy/Group: Individual Therapy  Charlane Ferretti., CCC-SLP [phone]  Meghan Yu 04/07/2013, 4:33 PM

## 2013-04-07 NOTE — Progress Notes (Signed)
Patient ID: Meghan Yu, female   DOB: 07-May-1932, 79 y.o.   MRN: [id number] Subjective/Complaints:  Awake this morning. No headache. Oral intake is good. Oriented to person place and day/date A 12 point review of systems has been performed and if not noted above is otherwise negative.   Objective: Vital Signs: Blood pressure 126/72, pulse 96, temperature 97.8 F (36.6 C), temperature source Oral, resp. rate 18, height 5\' 1"  (1.549 m), weight 55 kg (121 lb 4.1 oz), SpO2 96.00%. No results found.  Recent Labs  04/05/13 0531  WBC 8.9  HGB 10.7*  HCT 31.1*  PLT 193    Recent Labs  04/05/13 0531 04/07/13 0630  NA 136 135  K 3.6 4.1  CL 100 101  GLUCOSE 151* 132*  BUN 24* 19  CREATININE 0.58 0.58  CALCIUM 8.2* 8.7   CBG (last 3)   Recent Labs  04/06/13 1641 04/06/13 2113 04/07/13 0734  GLUCAP 113* 130* 118*    Wt Readings from Last 3 Encounters:  04/02/13 55 kg (121 lb 4.1 oz)  03/26/13 60.782 kg (134 lb)  03/26/13 60.782 kg (134 lb)       Physical Exam:  Constitutional: She is oriented to person, place, and time. She appears well-developed and well-nourished.  HENT:  Scalp incision clean and dry with staples and sutures in place.  Eyes: Pupils are equal, round, and reactive to light.  Neck: Normal range of motion.  Cardiovascular: Normal rate and regular rhythm.  Pulmonary/Chest: Effort normal and breath sounds normal. No respiratory distress.  Abdominal: Soft. Bowel sounds are normal. She exhibits no distension. There is no tenderness.  Musculoskeletal: She exhibits edema (right knee).  Neurological: She is alert and oriented to person, place, and time.  Flat affect, looks straight ahead but able to turn to left with cues. Denies dizziness. No nystagmus noted. Mild right facial weakness. No dysarthria. Voice is hoarse. Delayed output with measured movements. Able to follow basic commands. Oriented to self, place, situation, age/DOB. Decreased awareness of  deficits. RUE 4/5. LUE 4+/5. RLE is 4/5. LLE is 4+/5. S. Decreased FMC of right arm and leg.  Skin: Skin is warm and dry. bruising along left neck as well as bilateral UE's.  motor strength 2 minus in the right deltoid, biceps, triceps, grip  3 minus the right hip flexors knee extensor 2 minus in ankle dorsiflexor plantar flexor  5/5 in the left deltoid, biceps, triceps, grip, hip flexor, knee extensors, ankle dorsiflexor plantar flexor  Sensation reduced to light touch in the right upper and right lower extremity normal in the left upper and left lower extremity  Psych:  No emotional lability  Assessment/Plan: 1. Functional deficits secondary to right frontal meningioma s/p resection which require 3+ hours per day of interdisciplinary therapy in a comprehensive inpatient rehab setting. Physiatrist is providing close team supervision and 24 hour management of active medical problems listed below. Physiatrist and rehab team continue to assess barriers to discharge/monitor patient progress toward functional and medical goals.  Team conference today please see physician documentation under team conference tab, met with team face-to-face to discuss problems,progress, and goals. Formulized individual treatment plan based on medical history, underlying problem and comorbidities. FIM: FIM - Bathing Bathing Steps Patient Completed: Chest;Right Arm;Left Arm;Abdomen;Front perineal area;Buttocks;Right upper leg;Left upper leg Bathing: 4: Min-Patient completes 8-9 78f 10 parts or 75+ percent  FIM - Upper Body Dressing/Undressing Upper body dressing/undressing steps patient completed: Thread/unthread left sleeve of pullover shirt/dress;Thread/unthread right sleeve of pullover shirt/dresss;Pull shirt  over trunk;Put head through opening of pull over shirt/dress Upper body dressing/undressing: 5: Set-up assist to: Obtain clothing/put away FIM - Lower Body Dressing/Undressing Lower body dressing/undressing  steps patient completed: Thread/unthread left pants leg;Pull pants up/down;Pull underwear up/down Lower body dressing/undressing: 3: Mod-Patient completed 50-74% of tasks  FIM - Toileting Toileting steps completed by patient: Adjust clothing prior to toileting;Performs perineal hygiene;Adjust clothing after toileting Toileting Assistive Devices: Grab bar or rail for support Toileting: 4: Steadying assist  FIM - Diplomatic Services operational officer Devices: Walker;Elevated toilet seat Toilet Transfers: 4-To toilet/BSC: Min A (steadying Pt. > 75%);4-From toilet/BSC: Min A (steadying Pt. > 75%)  FIM - Bed/Chair Transfer Bed/Chair Transfer Assistive Devices: Bed rails;HOB elevated;Arm rests Bed/Chair Transfer: 5: Supine > Sit: Supervision (verbal cues/safety issues);4: Bed > Chair or W/C: Min A (steadying Pt. > 75%);4: Chair or W/C > Bed: Min A (steadying Pt. > 75%)  FIM - Locomotion: Wheelchair Distance: 150 Locomotion: Wheelchair: 1: Total Assistance/staff pushes wheelchair (Pt<25%) FIM - Locomotion: Ambulation Locomotion: Ambulation Assistive Devices: Other (comment) (HHA) Ambulation/Gait Assistance: 3: Mod assist Locomotion: Ambulation: 1: Travels less than 50 ft with moderate assistance (Pt: 50 - 74%)  Comprehension Comprehension Mode: Auditory Comprehension: 5-Follows basic conversation/direction: With no assist  Expression Expression Mode: Verbal Expression: 5-Expresses basic needs/ideas: With no assist  Social Interaction Social Interaction: 6-Interacts appropriately with others with medication or extra time (anti-anxiety, antidepressant).  Problem Solving Problem Solving: 4-Solves basic 75 - 89% of the time/requires cueing 10 - 24% of the time  Memory Memory: 5-Recognizes or recalls 90% of the time/requires cueing < 10% of the time Medical Problem List and Plan:  1. DVT Prophylaxis/Anticoagulation: Mechanical: Sequential compression devices, below knee Bilateral  lower extremities  2. Pain Management: Headaches resolved. Use tylenol prn  3. Mood: Anxiety seems to be controlled except at night. Resumed home hs dose of xanax.  -monitor clinically for am sedation--none at present 4. Neuropsych: This patient is capable of making decisions on her own behalf.  5. Steroid induced hyperglycemia: Will monitor BS with AC/HS checks. Use SSI as needed for elevated BS.   -wean steroids 6. ABLA: Will recheck on Monday.  7. Constipation: Resolved with miralax. Will back off on laxatives due to history of bowel urgency--use prn.  8. Hypothyroid: continue supplement 9.  UTI vs colonization- will treat secondary to immunocompromise from steroids  LOS (Days) 5 A FACE TO FACE EVALUATION WAS PERFORMED  Erick Colace 04/07/2013 9:27 AM

## 2013-04-08 ENCOUNTER — Inpatient Hospital Stay (HOSPITAL_COMMUNITY): Payer: Medicare Other | Admitting: Speech Pathology

## 2013-04-08 ENCOUNTER — Encounter (HOSPITAL_COMMUNITY): Payer: PRIVATE HEALTH INSURANCE | Admitting: Occupational Therapy

## 2013-04-08 ENCOUNTER — Inpatient Hospital Stay (HOSPITAL_COMMUNITY): Payer: Medicare Other

## 2013-04-08 DIAGNOSIS — C719 Malignant neoplasm of brain, unspecified: Secondary | ICD-10-CM

## 2013-04-08 LAB — GLUCOSE, CAPILLARY: Glucose-Capillary: 125 mg/dL — ABNORMAL HIGH (ref 70–99)

## 2013-04-08 MED ORDER — WHITE PETROLATUM GEL
Status: AC
  Administered 2013-04-08: 0.2
  Filled 2013-04-08: qty 5

## 2013-04-08 NOTE — Progress Notes (Signed)
Physical Therapy Note  Patient Details  Name: Meghan Yu MRN: [id number] Date of Birth: 17-Sep-1932 Today's Date: 04/08/2013  8:30 - 9:00 30 minutes Individual session Patient denies pain.  Patient eating breakfast upon entering room at 8:00. Therapist allowed patient time to eat and will make up minutes in pm. Patient supine to sit with HOB elevated with supervision. Patient required min assist with zipper on robe. Patient ambulated 180 feet x 2 with rolling walker and supervision. Patient required cueing to stay closer to walker. Patient ambulated around obstacles and over obstacles with close supervision. Patient much better at staying with walker when avoiding obstacles. Patient ambulated up and down 5 steps with 1 rail and min assist. Patient tested for gait speed and ambulated 1.4 ft/sec using RW. Patient requested to rest in bed following session. Patient required min assist with lifting right LE into bed for sit to supine. Patient left in bed with items in reach and bed alarm set.    [name] 04/08/2013, 11:03 AM

## 2013-04-08 NOTE — Progress Notes (Signed)
Patient ID: Meghan Yu, female   DOB: Jul 24, 1932, 79 y.o.   MRN: [id number] Subjective/Complaints:  Therapy notes poor sitting balance, no pt c/o Oriented to person place and day/date A 12 point review of systems has been performed and if not noted above is otherwise negative.   Objective: Vital Signs: Blood pressure 135/73, pulse 75, temperature 97.9 F (36.6 C), temperature source Oral, resp. rate 18, height 5\' 1"  (1.549 m), weight 55 kg (121 lb 4.1 oz), SpO2 99.00%. No results found. No results found for this basename: WBC, HGB, HCT, PLT,  in the last 72 hours  Recent Labs  04/07/13 0630  NA 135  K 4.1  CL 101  GLUCOSE 132*  BUN 19  CREATININE 0.58  CALCIUM 8.7   CBG (last 3)   Recent Labs  04/07/13 1617 04/07/13 2052 04/08/13 0734  GLUCAP 129* 131* 126*    Wt Readings from Last 3 Encounters:  04/02/13 55 kg (121 lb 4.1 oz)  03/26/13 60.782 kg (134 lb)  03/26/13 60.782 kg (134 lb)       Physical Exam:  Constitutional: She is oriented to person, place, and time. She appears well-developed and well-nourished.  HENT:  Scalp incision clean and dry with staples and sutures in place.  Eyes: Pupils are equal, round, and reactive to light.  Neck: Normal range of motion.  Cardiovascular: Normal rate and regular rhythm.  Pulmonary/Chest: Effort normal and breath sounds normal. No respiratory distress.  Abdominal: Soft. Bowel sounds are normal. She exhibits no distension. There is no tenderness.  Musculoskeletal: She exhibits edema (right knee).  Neurological: She is alert and oriented to person, place, and time.  Flat affect, looks straight ahead but able to turn to left with cues. Denies dizziness. No nystagmus noted. Mild right facial weakness. No dysarthria. Voice is hoarse. Delayed output with measured movements. Able to follow basic commands. Oriented to self, place, situation, age/DOB. Decreased awareness of deficits. RUE 4/5. LUE 4+/5. RLE is 4/5. LLE is 4+/5.  S. Decreased FMC of right arm and leg.  Skin: Skin is warm and dry. bruising along left neck as well as bilateral UE's.  motor strength 2 minus in the right deltoid, biceps, triceps, grip  3 minus the right hip flexors knee extensor 2 minus in ankle dorsiflexor plantar flexor  5/5 in the left deltoid, biceps, triceps, grip, hip flexor, knee extensors, ankle dorsiflexor plantar flexor  Sensation reduced to light touch in the right upper and right lower extremity normal in the left upper and left lower extremity  Psych:  No emotional lability  Assessment/Plan: 1. Functional deficits secondary to right frontal meningioma s/p resection which require 3+ hours per day of interdisciplinary therapy in a comprehensive inpatient rehab setting. Physiatrist is providing close team supervision and 24 hour management of active medical problems listed below. Physiatrist and rehab team continue to assess barriers to discharge/monitor patient progress toward functional and medical goals.   FIM: FIM - Bathing Bathing Steps Patient Completed: Chest;Right Arm;Left Arm;Abdomen;Front perineal area;Buttocks;Right upper leg;Left upper leg;Right lower leg (including foot);Left lower leg (including foot) Bathing: 5: Set-up assist to: Obtain items  FIM - Upper Body Dressing/Undressing Upper body dressing/undressing steps patient completed: Thread/unthread left sleeve of pullover shirt/dress;Thread/unthread right sleeve of pullover shirt/dresss;Pull shirt over trunk;Put head through opening of pull over shirt/dress Upper body dressing/undressing: 5: Set-up assist to: Obtain clothing/put away FIM - Lower Body Dressing/Undressing Lower body dressing/undressing steps patient completed: Thread/unthread left pants leg;Pull pants up/down;Pull underwear up/down;Thread/unthread right pants leg;Don/Doff  right sock;Don/Doff left sock;Don/Doff right shoe;Don/Doff left shoe;Fasten/unfasten right shoe;Fasten/unfasten left shoe Lower  body dressing/undressing: 5: Set-up assist to: Obtain clothing  FIM - Toileting Toileting steps completed by patient: Performs perineal hygiene;Adjust clothing after toileting;Adjust clothing prior to toileting Toileting Assistive Devices: Grab bar or rail for support Toileting: 5: Supervision: Safety issues/verbal cues  FIM - Diplomatic Services operational officer Devices: Grab bars;Walker;Elevated toilet seat Toilet Transfers: 5-To toilet/BSC: Supervision (verbal cues/safety issues);5-From toilet/BSC: Supervision (verbal cues/safety issues)  FIM - Press photographer Assistive Devices: Arm rests;Walker Bed/Chair Transfer: 4: Sit > Supine: Min A (steadying pt. > 75%/lift 1 leg);4: Supine > Sit: Min A (steadying Pt. > 75%/lift 1 leg);4: Chair or W/C > Bed: Min A (steadying Pt. > 75%);4: Bed > Chair or W/C: Min A (steadying Pt. > 75%)  FIM - Locomotion: Wheelchair Distance: 150 Locomotion: Wheelchair: 1: Total Assistance/staff pushes wheelchair (Pt<25%) FIM - Locomotion: Ambulation Locomotion: Ambulation Assistive Devices: Emergency planning/management officer Ambulation/Gait Assistance: 3: Mod assist;4: Min assist Locomotion: Ambulation: 3: Travels 150 ft or more with moderate assistance (Pt: 50 - 74%)  Comprehension Comprehension Mode: Auditory Comprehension: 5-Follows basic conversation/direction: With extra time/assistive device  Expression Expression Mode: Verbal Expression: 5-Expresses basic needs/ideas: With no assist  Social Interaction Social Interaction: 4-Interacts appropriately 75 - 89% of the time - Needs redirection for appropriate language or to initiate interaction.  Problem Solving Problem Solving: 4-Solves basic 75 - 89% of the time/requires cueing 10 - 24% of the time  Memory Memory: 4-Recognizes or recalls 75 - 89% of the time/requires cueing 10 - 24% of the time Medical Problem List and Plan:  1. DVT Prophylaxis/Anticoagulation: Mechanical: Sequential  compression devices, below knee Bilateral lower extremities  2. Pain Management: Headaches resolved. Use tylenol prn  3. Mood: Anxiety seems to be controlled except at night. Resumed home hs dose of xanax.  -monitor clinically for am sedation--none at present 4. Neuropsych: This patient is capable of making decisions on her own behalf.  5. Steroid induced hyperglycemia: Will monitor BS with AC/HS checks. Use SSI as needed for elevated BS.   -wean steroids 6. ABLA: Will recheck on Monday.  7. Constipation: Resolved with miralax. Will back off on laxatives due to history of bowel urgency--use prn.  8. Hypothyroid: continue supplement 9.  UTI vs colonization- will treat secondary to immunocompromise from steroids  LOS (Days) 6 A FACE TO FACE EVALUATION WAS PERFORMED  Erick Colace 04/08/2013 8:37 AM

## 2013-04-08 NOTE — Progress Notes (Signed)
Physical Therapy Note  Patient Details  Name: Meghan Yu MRN: [id number] Date of Birth: Apr 28, 1932 Today's Date: 04/08/2013  1:55 - 2:30 35 minutes Individual session Patient denies pain.  Treatment focused on gait, balance and bed mobility. Patient ambulated 120 feet with rolling walker on level tile in controlled environment and supervision. Patient ambulated in apartment simulating home setting with RW and supervision. Patient sit <> supine on regular double bed with min assist for right LE. Patient performed right heel slides and hip abduction exercises on right simulating movement of getting LE onto bed. Patient practiced sit to supine again and performed with supervision. Patient ambulated with single point cane 86 feet x 1 and 40 feet x 1 with close supervision. Patient worked on Facilities manager system working on [employer] shift control to right using weight shift and limits of stability exercises. Patient ambulated 200 feet with RW back to room and was left as requested resting in bed with 3 side rails up and bed alarm set. All items in reach.    [name] 04/08/2013, 3:35 PM

## 2013-04-08 NOTE — Social Work (Signed)
Lucy Chris, LCSW Social Worker Signed  Patient Care Conference Service date: 04/07/2013 2:50 PM  Inpatient RehabilitationTeam Conference and Plan of Care Update Date: 04/07/2013   Time: 10;30 Am     Patient Name: Meghan Yu       Medical Record Number: [id number]   Date of Birth: 12-16-1931 Sex: Female         Room/Bed: 4037/4037-01 Payor Info: Payor: MEDICARE / Plan: MEDICARE PART A AND B / Product Type: *No Product type* /   Admitting Diagnosis: L FONTAL CRANI MENINGIOMA   Admit Date/Time:  04/02/2013 11:56 AM Admission Comments: No comment available   Primary Diagnosis:  Brain tumor Principal Problem: Brain tumor    Patient Active Problem List     Diagnosis  Date Noted   .  Hyperglycemia--steroid induced  04/05/2013   .  Dehydration  04/05/2013   .  E. coli UTI  04/05/2013   .  Brain tumor  03/26/2013   .  Hypothyroidism  03/26/2013   .  Hyperlipidemia  03/26/2013     Expected Discharge Date: Expected Discharge Date: 04/15/13  Team Members Present: Physician leading conference: Dr. Claudette Laws Social Worker Present: Dossie Der, LCSW Nurse Present: Laural Roes, RN PT Present: [name], PT;Caroline Adriana Simas, PT;Other (comment) Clarisse Gouge Ripa-PT) OT Present: Other (comment);Leonette Monarch, Felipa Eth, OT (Kayla Perkinson-OT) SLP Present: Fae Pippin, SLP Other (Discipline and Name): Charolette Child        Current Status/Progress  Goal  Weekly Team Focus   Medical     Headache resolved, appetite improved, fine motor deficits right side.  Maximize independence  Neuromuscular reeducation on the right., Improved endurance   Bowel/Bladder     continent of bladder, can have urgency/occasional incontinence at night, keflex for uti  remain continent of bladder  timed toileting q2-3 prn   Swallow/Nutrition/ Hydration     na       ADL's     min assist bathing, supervision UB dressing, mod assist LB dressing, min assist transfers and mobility  supervision  overall  initiation, sequencing, postural control, standing balance, right attention   Mobility     min-mod A with intermittent total  A needed for balance during freezing moments  supervision overall  initiation, postural control, balance, gait   Communication     supervision   supervision   increase initiation    Safety/Cognition/ Behavioral Observations    Mod assist  Min assist   increase awareness, self-monitoring and correcting   Pain     denies pain  free of pain  monitor for pain   Skin     Bruising to L hand. incision to Right side and staples to Left side of head  no skin breakdown  monitor skin q shift prn     *See Care Plan and progress notes for long and short-term goals.    Barriers to Discharge:  See above      Possible Resolutions to Barriers:    See above      Discharge Planning/Teaching Needs:    Home with daughter and ex-husband to assist-will have 24 hour care      Team Discussion:    Timed tolieting-working on cont Fine motor deficits, overall doing well.  Monitoring skin-fragile   Revisions to Treatment Plan:    None    Continued Need for Acute Rehabilitation Level of Care: The patient requires daily medical management by a physician with specialized training in physical medicine and rehabilitation for the following conditions:  Daily direction of a multidisciplinary physical rehabilitation program to ensure safe treatment while eliciting the highest outcome that is of practical value to the patient.: Yes Daily medical management of patient stability for increased activity during participation in an intensive rehabilitation regime.: Yes Daily analysis of laboratory values and/or radiology reports with any subsequent need for medication adjustment of medical intervention for : Neurological problems  Meghan Yu, Lemar Livings 04/07/2013, 2:50 PM

## 2013-04-08 NOTE — Progress Notes (Signed)
Social Work Patient ID: Meghan Yu, female   DOB: Dec 27, 1931, 79 y.o.   MRN: [id number] Met with pt and left message for her daughter to inform team conference goals-supervision level and discharge 6/26.  Pt feels she is doing  Well and is looking forward to going home.  Husband to come back by the weekend.  Await return call from daughter regarding questions or concerns Regarding discharge and goals.

## 2013-04-08 NOTE — Progress Notes (Signed)
Occupational Therapy Session Note  Patient Details  Name: Meghan Yu MRN: [id number] Date of Birth: May 22, 1932  Today's Date: 04/08/2013 Time: 6213-0865 Time Calculation (min): 25 min  Short Term Goals: Week 1:  OT Short Term Goal 1 (Week 1): Pt will pick out her clothing with min cues OT Short Term Goal 2 (Week 1): Pt will perform basic transfers to toilet with close supervision OT Short Term Goal 3 (Week 1): Pt will initiate using the call bell to notifiy staff of toileting needs 50% of time OT Short Term Goal 4 (Week 1): Pt will perform bed mobility in prep for morning routine with supervision OT Short Term Goal 5 (Week 1): Pt will attend to right field with mod A in functional tasks.  Skilled Therapeutic Interventions/Progress Updates:    Therapy session focused on functional transfers, postural control in standing, attention to R, and activity tolerance. Pt completed toilet task with min guard assist and ambulated from room to bathroom for tx with increased time and min guard assist. Ambulated from room to day room with RW and min guard assist. Required rest break before engaging in standing task of watering plants. Pt used RUE to manage water bucket as she walked down counter to water all plants, moving to her R. Demonstrated good safety as she slid water bucket down counter. Pt required no verbal cues to scan and attend to all plants on R side. Pt required rest break then passed over to PT for next therapy session.   Therapy Documentation Precautions:  Precautions Precautions: Fall Restrictions Weight Bearing Restrictions: No General:   Vital Signs: Therapy Vitals Temp: 98.2 F (36.8 C) Temp src: Oral Pulse Rate: 75 Resp: 18 BP: 135/56 mmHg Patient Position, if appropriate: Lying Oxygen Therapy SpO2: 99 % O2 Device: None (Room air) Pain: No c/o pain during therapy session   See FIM for current functional status  Therapy/Group: Individual Therapy  Daneil Dan 04/08/2013, 3:11 PM

## 2013-04-08 NOTE — Progress Notes (Signed)
Speech Language Pathology Daily Session Note  Patient Details  Name: Meghan Yu MRN: [id number] Date of Birth: 03/24/1932  Today's Date: 04/08/2013 Time: 0915-1000 Time Calculation (min): 45 min  Short Term Goals: Week 1: SLP Short Term Goal 1 (Week 1): The patient will demonstrate functional problem solving with basic and familiar tasks with min A multimodal cueing.  SLP Short Term Goal 2 (Week 1): The patient will utilize external memory aids to recall information with min A multimodal cueing.   SLP Short Term Goal 3 (Week 1): Patient will identify two physical deficits that may hinder safety, with [facility] A questions cues.   Skilled Therapeutic Interventions: Skilled treatment session focused on addressing cognitive-linguistic goals.  SLP facilitated session with review and Supervision level verbal cues to complete carryover task that was given to patient yesterday for categorical naming.  SLP also facilitated session with introduction of a medication management chart that assisted with recall of current medications, functions and frequency and reduced cues from Total to Min assist.  Recommend loading medication box tomorrow.  Continue with current plan of care.   FIM:  Comprehension Comprehension Mode: Auditory Comprehension: 5-Follows basic conversation/direction: With extra time/assistive device Expression Expression Mode: Verbal Expression: 5-Expresses basic needs/ideas: With extra time/assistive device Social Interaction Social Interaction: 4-Interacts appropriately 75 - 89% of the time - Needs redirection for appropriate language or to initiate interaction. Problem Solving Problem Solving: 4-Solves basic 75 - 89% of the time/requires cueing 10 - 24% of the time Memory Memory: 4-Recognizes or recalls 75 - 89% of the time/requires cueing 10 - 24% of the time FIM - Eating Eating Activity: 5: Set-up assist for open containers  Pain Pain Assessment Pain Assessment: No/denies  pain  Therapy/Group: Individual Therapy  Charlane Ferretti., CCC-SLP [phone]  Meghan Yu 04/08/2013, 12:24 PM

## 2013-04-08 NOTE — Progress Notes (Signed)
Occupational Therapy Session Note  Patient Details  Name: Meghan Yu MRN: [id number] Date of Birth: Sep 04, 1932  Today's Date: 04/08/2013 Time: 1112-1207 Time Calculation (min): 55 min  Short Term Goals: Week 1:  OT Short Term Goal 1 (Week 1): Pt will pick out her clothing with min cues OT Short Term Goal 2 (Week 1): Pt will perform basic transfers to toilet with close supervision OT Short Term Goal 3 (Week 1): Pt will initiate using the call bell to notifiy staff of toileting needs 50% of time OT Short Term Goal 4 (Week 1): Pt will perform bed mobility in prep for morning routine with supervision OT Short Term Goal 5 (Week 1): Pt will attend to right field with mod A in functional tasks.  Skilled Therapeutic Interventions/Progress Updates:    1:1 self care retraining at shower level. Pt in bed asleep when arrived. Focus on functional ambulation around room with RW to obtain clothing items in prep for dressing, simple problem solving with sequencing and organization of ADL tasks, sitting balance in shower  (pt required mod cuing for maintaining sitting balance at midline- pt with lean forward and to the right), problem solving maintaining balance while trying to dress right LE (pt keeps it on the floor- weight bearing through it while trying to thread pants), and intellectual to emergent awareness. Pt very pleasant and good recall of early events in therapy.   Therapy Documentation Precautions:  Precautions Precautions: Fall Restrictions Weight Bearing Restrictions: No Pain: Pain Assessment Pain Assessment: No/denies pain  See FIM for current functional status  Therapy/Group: Individual Therapy  Roney Mans [facility] 04/08/2013, 2:27 PM

## 2013-04-09 ENCOUNTER — Inpatient Hospital Stay (HOSPITAL_COMMUNITY): Payer: PRIVATE HEALTH INSURANCE | Admitting: Occupational Therapy

## 2013-04-09 ENCOUNTER — Inpatient Hospital Stay (HOSPITAL_COMMUNITY): Payer: Medicare Other | Admitting: Speech Pathology

## 2013-04-09 ENCOUNTER — Inpatient Hospital Stay (HOSPITAL_COMMUNITY): Payer: Medicare Other | Admitting: Occupational Therapy

## 2013-04-09 ENCOUNTER — Inpatient Hospital Stay (HOSPITAL_COMMUNITY): Payer: Medicare Other | Admitting: Physical Therapy

## 2013-04-09 LAB — GLUCOSE, CAPILLARY
Glucose-Capillary: 177 mg/dL — ABNORMAL HIGH (ref 70–99)
Glucose-Capillary: 139 mg/dL — ABNORMAL HIGH (ref 70–99)

## 2013-04-09 NOTE — Progress Notes (Addendum)
Occupational Therapy Weekly Progress Note  Patient Details  Name: Meghan Yu MRN: [id number] Date of Birth: Feb 20, 1932  Today's Date: 04/09/2013 Time: 1016-1100 and 2130-8657 Time Calculation (min): 44 min and 40 min  Patient has met 5 of 5 short term goals.  Pt is making steady progress towards goals, requiring close supervision with functional mobility in home environment during self-care tasks.  Pt is demonstrating increased standing balance, continues to stand in flexed, forward standing posture.  Pt continues to require min cues for initiation, sequencing, and problem solving.  Patient continues to demonstrate the following deficits: Rt sided hemiplegia with impaired attention, initiation, delayed processing, impaired motor control, timing, sequencing, impaired postural control, balance, activity tolerance, and decreased visual attention/scanning to Rt and therefore will continue to benefit from skilled OT intervention to enhance overall performance with BADL and Reduce care partner burden.  Patient progressing toward long term goals..  Continue plan of care.  OT Short Term Goals Week 1:  OT Short Term Goal 1 (Week 1): Pt will pick out her clothing with min cues OT Short Term Goal 1 - Progress (Week 1): Met OT Short Term Goal 2 (Week 1): Pt will perform basic transfers to toilet with close supervision OT Short Term Goal 2 - Progress (Week 1): Met OT Short Term Goal 3 (Week 1): Pt will initiate using the call bell to notifiy staff of toileting needs 50% of time OT Short Term Goal 3 - Progress (Week 1): Met OT Short Term Goal 4 (Week 1): Pt will perform bed mobility in prep for morning routine with supervision OT Short Term Goal 4 - Progress (Week 1): Met OT Short Term Goal 5 (Week 1): Pt will attend to right field with mod A in functional tasks. OT Short Term Goal 5 - Progress (Week 1): Met Week 2:  OT Short Term Goal 1 (Week 2): STG = LTGs due to remaining LOS  Skilled Therapeutic  Interventions/Progress Updates:    1) Pt seen for ADL retraining with focus on functional mobility and attention to Rt visual field during self-care tasks.  Pt reports not wanting to bathe at shower level this session, secondary to bathing yesterday, however willing to bathe at sink.  Pt overall supervision with self-care tasks of bathing and dressing, requiring min cues for threading Rt pant leg.  Engaged in grooming in standing with no LOB this session.  Biodex balance activity conducted with pt requiring increased time and demonstrating decreased weight shifting forward both to Rt and Lt.    2) Pt seen for 1:1 OT with focus on functional mobility with RW in ADL kitchen.  Pt reports cooking an egg for breakfast or instant oatmeal and cooking soup or microwave meals for lunch and dinner.  Pt ambulated through kitchen with RW and close supervision with reaching in cabinets and refrigerator to obtain necessary items to prepare a meal.  Pt created a grocery list of necessary items needed to prepare an egg sandwich, plan to perform simple cooking task during another OT session. Return to room, pt reports needing to toilet.  Ambulated to bathroom with RW with close supervision where pt completed toilet transfer and toileting with supervision.  Therapy Documentation Precautions:  Precautions Precautions: Fall Restrictions Weight Bearing Restrictions: No General:   Vital Signs: Therapy Vitals Temp: 98.2 F (36.8 C) Temp src: Oral Pulse Rate: 96 Resp: 16 BP: 128/72 mmHg Patient Position, if appropriate: Lying Oxygen Therapy SpO2: 97 % O2 Device: None (Room air) Pain: Pain Assessment Pain  Assessment: No/denies pain ADL: ADL Grooming: Supervision/safety Where Assessed-Grooming: Standing at sink Upper Body Bathing: Supervision/safety Where Assessed-Upper Body Bathing: Sitting at sink Lower Body Bathing: Minimal assistance Where Assessed-Lower Body Bathing: Sitting at sink;Standing at  sink Upper Body Dressing: Supervision/safety;Setup Where Assessed-Upper Body Dressing: Sitting at sink Lower Body Dressing: Minimal assistance Where Assessed-Lower Body Dressing: Sitting at sink;Standing at sink Toileting: Supervision/safety Where Assessed-Toileting: Teacher, adult education: Close supervision Toilet Transfer Method: Proofreader: Engineer, manufacturing systems Transfer: Insurance underwriter Method: Designer, industrial/product: Information systems manager with back;Grab bars  See FIM for current functional status  Therapy/Group: Individual Therapy  Leonette Monarch 04/09/2013, 3:30 PM

## 2013-04-09 NOTE — Progress Notes (Signed)
Speech Language Pathology Daily Session Note  Patient Details  Name: Meghan Yu MRN: [id number] Date of Birth: Jan 21, 1932  Today's Date: 04/09/2013 Time: 4098-1191 Time Calculation (min): 27 min  Short Term Goals: Week 1: SLP Short Term Goal 1 (Week 1): The patient will demonstrate functional problem solving with basic and familiar tasks with min A multimodal cueing.  SLP Short Term Goal 2 (Week 1): The patient will utilize external memory aids to recall information with min A multimodal cueing.   SLP Short Term Goal 3 (Week 1): Patient will identify two physical deficits that may hinder safety, with [facility] A questions cues.   Skilled Therapeutic Interventions: Skilled treatment session focused on addressing cognitive-linguistic goals.  SLP facilitated session with a medication managment task and Max faded to Mod assist verbal and visual cues to accuratelyl load a medication box.  Patient demonstraetd decreased self-monitoring and correcting as well as cues to utilize external memory aid. Continue with current plan of care.   FIM:  Comprehension Comprehension Mode: Auditory Comprehension: 5-Follows basic conversation/direction: With extra time/assistive device Expression Expression Mode: Verbal Expression: 5-Expresses basic needs/ideas: With extra time/assistive device Social Interaction Social Interaction: 4-Interacts appropriately 75 - 89% of the time - Needs redirection for appropriate language or to initiate interaction. Problem Solving Problem Solving: 4-Solves basic 75 - 89% of the time/requires cueing 10 - 24% of the time Memory Memory: 4-Recognizes or recalls 75 - 89% of the time/requires cueing 10 - 24% of the time  Pain Pain Assessment Pain Assessment: No/denies pain  Therapy/Group: Individual Therapy  Charlane Ferretti., CCC-SLP [phone]  Meghan Yu 04/09/2013, 1:28 PM

## 2013-04-09 NOTE — Progress Notes (Signed)
Physical Therapy Weekly Progress Note  Patient Details  Name: Meghan Yu MRN: [id number] Date of Birth: Jun 02, 1932  Today's Date: 04/09/2013 Time: 0900-0959 Time Calculation (min): 59 min  Patient has made good progress and has met 3 of 4 short term goals.  Pt is currently supervision-min A overall for bed mobility, basic transfers, min A for ambulation with RW in controlled environment, stair negotiation with one rail but can require as much as mod A for gait without AD in controlled environment and total A during LOB to R and posterior.    Patient continues to demonstrate the following deficits: R sided hemiplegia with impaired attention, initiation, delayed processing, impaired motor control, timing, sequencing, impaired postural control, balance, gait, activity tolerance and therefore will continue to benefit from skilled PT intervention to enhance overall performance with activity tolerance, balance, postural control, ability to compensate for deficits, functional use of  right upper extremity and right lower extremity, attention, awareness and coordination.  Patient progressing toward long term goals..  Continue plan of care.  PT Short Term Goals Week 1:  PT Short Term Goal 1 (Week 1): Pt will peform bed mobility on flat bed, no rail with supervision PT Short Term Goal 1 - Progress (Week 1): Progressing toward goal PT Short Term Goal 2 (Week 1): Pt will peformed bed <> w/c transfers with min A  PT Short Term Goal 2 - Progress (Week 1): Met PT Short Term Goal 3 (Week 1): Pt will perform ambulation in controlled environment with LRAD and min A x 100' PT Short Term Goal 3 - Progress (Week 1): Met PT Short Term Goal 4 (Week 1): Pt will perform stair negotiation up and down 4 stairs with one rail and min A PT Short Term Goal 4 - Progress (Week 1): Met Week 2:  PT Short Term Goal 1 (Week 2): = LTG  Skilled Therapeutic Interventions/Progress Updates:   Pt received in bed; performed supine  > sit from flat bed, no rails with supervision but extra time.  Pt received RW for home; performed gait with RW in controlled environment x 150' x 2 with verbal, tactile cues to maintain upright posture, maintain safe distance to RW, maintain RW in midline to avoid hitting objects on R and for full RLE step length.  Pt reporting need to use toilet; performed transfer to toilet with supervision-min A and RW; pt attempting to sit too early and required total verbal cues and extra time secondary to poor initiation to perform retro stepping until she feels toilet on back of legs prior to sitting.  Pt performed all toileting tasks with supervision and extra time and hand washing at sink with supervision and extra time.  Back in gym performed NMR; see below for details.  Returned to room to sit in w/c and rest prior to B&D.    Therapy Documentation Precautions:  Precautions Precautions: Fall Restrictions Weight Bearing Restrictions: No Pain: Pain Assessment Pain Assessment: No/denies pain Locomotion : Ambulation Ambulation/Gait Assistance: 4: Min guard   Other Treatments: Treatments Neuromuscular Facilitation: Right;Upper Extremity;Lower Extremity;Activity to increase coordination;Activity to increase motor control;Activity to increase timing and sequencing;Activity to increase sustained activation;Activity to increase lateral weight shifting;Activity to increase anterior-posterior weight shifting in standing with no UE support during alternating LE ball kicks with min-mod A to maintain upright posture, full lateral weight shift and verbal and visual cues for R foot placement within BOS for stability during R stance.  In sitting performed postural control training while sitting on pink  balance disc during reaching for rings outside BOS in all planes of movement with R and LUE to facilitate thoracic elongation, lateral trunk elongation <> shortening during weight shifting with min-mod A to maintain balance  when reaching to R and during rotation.     See FIM for current functional status  Therapy/Group: Individual Therapy  [name] [facility] 04/09/2013, 12:14 PM

## 2013-04-09 NOTE — Progress Notes (Signed)
Occupational Therapy Session Note  Patient Details  Name: Meghan Yu MRN: [id number] Date of Birth: 1932-08-08  Today's Date: 04/09/2013 Time: 8469-6295 Time Calculation (min): 35 min  Short Term Goals: Week 2:  OT Short Term Goal 1 (Week 2): STG = LTGs due to remaining LOS  Skilled Therapeutic Interventions/Progress Updates:  Patient resting in bed upon arrival.  Patient ambulated to day room while pushing w/c with close supervision. Neuromuscular Facilitation: Right;Upper Extremity;Lower Extremity;Activity to increase coordination;Activity to increase motor control;Activity to increase timing and sequencing;Activity to increase sustained activation;Activity to increase lateral weight shifting during standing table top task.  Patient propelled w/c with min assist and mod cues and required assist to get back into bed with RLE and to move his upper body.  Therapy Documentation Precautions:  Precautions Precautions: Fall Restrictions Weight Bearing Restrictions: No Pain: Denied pain initially then reported right knee pain  See FIM for current functional status  Therapy/Group: Individual Therapy  Ansleigh Safer 04/09/2013, 4:00 PM

## 2013-04-09 NOTE — Progress Notes (Signed)
Patient ID: Meghan Yu, female   DOB: 1932/02/05, 79 y.o.   MRN: [id number] Subjective/Complaints:  No pain no c/o, slept ok Oriented to person place and day/date A 12 point review of systems has been performed and if not noted above is otherwise negative.   Objective: Vital Signs: Blood pressure 129/52, pulse 83, temperature 98.2 F (36.8 C), temperature source Oral, resp. rate 18, height 5\' 1"  (1.549 m), weight 55 kg (121 lb 4.1 oz), SpO2 97.00%. No results found. No results found for this basename: WBC, HGB, HCT, PLT,  in the last 72 hours  Recent Labs  04/07/13 0630  NA 135  K 4.1  CL 101  GLUCOSE 132*  BUN 19  CREATININE 0.58  CALCIUM 8.7   CBG (last 3)   Recent Labs  04/08/13 1656 04/08/13 2053 04/09/13 0728  GLUCAP 115* 125* 177*    Wt Readings from Last 3 Encounters:  04/02/13 55 kg (121 lb 4.1 oz)  03/26/13 60.782 kg (134 lb)  03/26/13 60.782 kg (134 lb)       Physical Exam:  Constitutional: She is oriented to person, place, and time. She appears well-developed and well-nourished.  HENT:  Scalp incision clean and dry with staples and sutures in place.  Eyes: Pupils are equal, round, and reactive to light.  Neck: Normal range of motion.  Cardiovascular: Normal rate and regular rhythm.  Pulmonary/Chest: Effort normal and breath sounds normal. No respiratory distress.  Abdominal: Soft. Bowel sounds are normal. She exhibits no distension. There is no tenderness.  Musculoskeletal: She exhibits edema (right knee).  Neurological: She is alert and oriented to person, place, and time.  Flat affect, looks straight ahead but able to turn to left with cues. Denies dizziness. No nystagmus noted. Mild right facial weakness. No dysarthria. Voice is hoarse. Delayed output with measured movements. Able to follow basic commands. Oriented to self, place, situation, age/DOB. Decreased awareness of deficits. RUE 4/5. LUE 4+/5. RLE is 4/5. LLE is 4+/5. S. Decreased FMC of  right arm and leg.  Skin: Skin is warm and dry. bruising along left neck as well as bilateral UE's.  motor strength 2 minus in the right deltoid, biceps, triceps, grip  3 minus the right hip flexors knee extensor 2 minus in ankle dorsiflexor plantar flexor  5/5 in the left deltoid, biceps, triceps, grip, hip flexor, knee extensors, ankle dorsiflexor plantar flexor  Sensation reduced to light touch in the right upper and right lower extremity normal in the left upper and left lower extremity  Psych:  No emotional lability  Assessment/Plan: 1. Functional deficits secondary to right frontal meningioma s/p resection which require 3+ hours per day of interdisciplinary therapy in a comprehensive inpatient rehab setting. Physiatrist is providing close team supervision and 24 hour management of active medical problems listed below. Physiatrist and rehab team continue to assess barriers to discharge/monitor patient progress toward functional and medical goals.   FIM: FIM - Bathing Bathing Steps Patient Completed: Chest;Right Arm;Left Arm;Abdomen;Front perineal area;Buttocks;Right upper leg;Left upper leg;Right lower leg (including foot);Left lower leg (including foot) Bathing: 5: Set-up assist to: Obtain items  FIM - Upper Body Dressing/Undressing Upper body dressing/undressing steps patient completed: Thread/unthread left sleeve of pullover shirt/dress;Thread/unthread right sleeve of pullover shirt/dresss;Pull shirt over trunk;Put head through opening of pull over shirt/dress Upper body dressing/undressing: 5: Set-up assist to: Obtain clothing/put away FIM - Lower Body Dressing/Undressing Lower body dressing/undressing steps patient completed: Thread/unthread left pants leg;Pull pants up/down;Pull underwear up/down;Thread/unthread right pants leg;Don/Doff right sock;Don/Doff  left sock;Don/Doff right shoe;Don/Doff left shoe;Fasten/unfasten right shoe;Fasten/unfasten left shoe;Thread/unthread right  underwear leg;Thread/unthread left underwear leg Lower body dressing/undressing: 4: Steadying Assist  FIM - Toileting Toileting steps completed by patient: Performs perineal hygiene;Adjust clothing after toileting;Adjust clothing prior to toileting Toileting Assistive Devices: Grab bar or rail for support Toileting: 4: Steadying assist  FIM - Diplomatic Services operational officer Devices: Grab bars;Walker;Elevated toilet seat Toilet Transfers: 4-To toilet/BSC: Min A (steadying Pt. > 75%);4-From toilet/BSC: Min A (steadying Pt. > 75%)  FIM - Bed/Chair Transfer Bed/Chair Transfer Assistive Devices: Arm rests;Walker Bed/Chair Transfer: 5: Sit > Supine: Supervision (verbal cues/safety issues);5: Supine > Sit: Supervision (verbal cues/safety issues);5: Bed > Chair or W/C: Supervision (verbal cues/safety issues);5: Chair or W/C > Bed: Supervision (verbal cues/safety issues)  FIM - Locomotion: Wheelchair Distance: 150 Locomotion: Wheelchair: 1: Total Assistance/staff pushes wheelchair (Pt<25%) FIM - Locomotion: Ambulation Locomotion: Ambulation Assistive Devices: Designer, industrial/product Ambulation/Gait Assistance: 5: Supervision Locomotion: Ambulation: 5: Travels 150 ft or more with supervision/safety issues  Comprehension Comprehension Mode: Auditory Comprehension: 5-Follows basic conversation/direction: With extra time/assistive device  Expression Expression Mode: Verbal Expression: 5-Expresses basic needs/ideas: With no assist  Social Interaction Social Interaction: 6-Interacts appropriately with others with medication or extra time (anti-anxiety, antidepressant).  Problem Solving Problem Solving: 4-Solves basic 75 - 89% of the time/requires cueing 10 - 24% of the time  Memory Memory: 5-Recognizes or recalls 90% of the time/requires cueing < 10% of the time Medical Problem List and Plan:  1. DVT Prophylaxis/Anticoagulation: Mechanical: Sequential compression devices, below knee  Bilateral lower extremities  2. Pain Management: Headaches resolved. Use tylenol prn  3. Mood: Anxiety seems to be controlled except at night. Resumed home hs dose of xanax.  -monitor clinically for am sedation--none at present 4. Neuropsych: This patient is capable of making decisions on her own behalf.  5. Steroid induced hyperglycemia: Will monitor BS with AC/HS checks. Use SSI as needed for elevated BS.   -wean steroids 6. ABLA: Will recheck on Monday.  7. Constipation: Resolved with miralax. Will back off on laxatives due to history of bowel urgency--use prn.  8. Hypothyroid: continue supplement 9.  UTI vs colonization- will treat secondary to immunocompromise from steroids  LOS (Days) 7 A FACE TO FACE EVALUATION WAS PERFORMED  Erick Colace 04/09/2013 8:31 AM

## 2013-04-10 ENCOUNTER — Inpatient Hospital Stay (HOSPITAL_COMMUNITY): Payer: Medicare Other | Admitting: *Deleted

## 2013-04-10 DIAGNOSIS — C719 Malignant neoplasm of brain, unspecified: Secondary | ICD-10-CM

## 2013-04-10 DIAGNOSIS — G8110 Spastic hemiplegia affecting unspecified side: Secondary | ICD-10-CM

## 2013-04-10 LAB — GLUCOSE, CAPILLARY
Glucose-Capillary: 137 mg/dL — ABNORMAL HIGH (ref 70–99)
Glucose-Capillary: 135 mg/dL — ABNORMAL HIGH (ref 70–99)

## 2013-04-10 NOTE — Progress Notes (Signed)
Physical Therapy Note  Patient Details  Name: Meghan Yu MRN: [id number] Date of Birth: December 10, 1931 Today's Date: 04/10/2013  1500- 1555 (55 minutes) group Pain : No complaint of pain Pt participated in PT group session focused on gait training/safety/endurance; Pt ambulates X 3 160 feet RW min to close SBA with vcs to stay closer to AD especially when changing direction.   [name],JIM 04/10/2013, 3:29 PM

## 2013-04-10 NOTE — Progress Notes (Signed)
Patient ID: Meghan Yu, female   DOB: [birth year], 79 y.o.   MRN: [id number] Subjective/Complaints:  Patient feels well. Has no specific complaints. Patient specifically denies chest pain, shortness of breath, headache.   Objective: Vital Signs: Blood pressure 112/71, pulse 83, temperature 98.2 F (36.8 C), temperature source Oral, resp. rate 19, height 5\' 1"  (1.549 m), weight 121 lb 4.1 oz (55 kg), SpO2 95.00%.  Elderly female in no acute distress. Chest clear auscultation. Cardiac exam S1-S2 are regular. Abdominal exam and bowel sounds, soft. Neurologic exam she is alert. Surgical scar on the right scalp is without discharge, erythema or tenderness.  Physical Exam: Assessment/Plan: 1. Functional deficits secondary to right frontal meningioma s/p resection   Medical Problem List and Plan:  1. DVT Prophylaxis/Anticoagulation: Mechanical: Sequential compression devices, below knee Bilateral lower extremities  2. Pain Management: Headaches resolved. Use tylenol prn  3. Mood: Anxiety seems to be controlled except at night. Resumed home hs dose of xanax.  -monitor clinically for am sedation--none at present 4. Neuropsych: This patient is capable of making decisions on her own behalf.  5. Steroid induced hyperglycemia: Will continue to monitor. She is on Decadron 2 mg twice a day currently. CBG (last 3)   Recent Labs  04/09/13 1644 04/09/13 2121 04/10/13 0729  GLUCAP 139* 145* 161*     6. ABLA: Will recheck on Monday.  Hemoglobin & Hematocrit     Component Value Date/Time   HGB 10.7* 04/05/2013 0531   HCT 31.1* 04/05/2013 0531     7. Constipation: Resolved  8. Hypothyroid: continue supplement 9.  UTI vs colonization- on Keflex  LOS (Days) 8 A FACE TO FACE EVALUATION WAS PERFORMED  [facility] HENRY 04/10/2013 8:52 AM

## 2013-04-11 ENCOUNTER — Inpatient Hospital Stay (HOSPITAL_COMMUNITY): Payer: PRIVATE HEALTH INSURANCE | Admitting: *Deleted

## 2013-04-11 LAB — GLUCOSE, CAPILLARY
Glucose-Capillary: 132 mg/dL — ABNORMAL HIGH (ref 70–99)
Glucose-Capillary: 102 mg/dL — ABNORMAL HIGH (ref 70–99)
Glucose-Capillary: 133 mg/dL — ABNORMAL HIGH (ref 70–99)

## 2013-04-11 NOTE — Progress Notes (Signed)
Patient ID: Meghan Yu, female   DOB: 1932-04-06, 79 y.o.   MRN: [id number] Subjective/Complaints:  Patient feels well. She has no complaints. She denies headache. She is encouraged with improvement with rehabilitation.   Objective: Vital Signs: Blood pressure 122/74, pulse 73, temperature 97.5 F (36.4 C), temperature source Oral, resp. rate 17, height 5\' 1"  (1.549 m), weight 121 lb 4.1 oz (55 kg), SpO2 96.00%.  Elderly female in no acute distress. Chest clear auscultation. Cardiac exam S1-S2 are regular. Abdominal exam and bowel sounds, soft. Neurologic exam she is alert. Surgical scar on the scalp is without discharge, erythema or tenderness.  Physical Exam: Assessment/Plan: 1. Functional deficits secondary to right frontal meningioma s/p resection   Medical Problem List and Plan:  1. DVT Prophylaxis/Anticoagulation: Mechanical: Sequential compression devices, below knee Bilateral lower extremities  2. Pain Management: Headaches resolved. Use tylenol prn  3. Mood: Anxiety seems to be controlled except at night. Resumed home hs dose of xanax.   4. Neuropsych: This patient is capable of making decisions on her own behalf.  5. Steroid induced hyperglycemia: Will continue to monitor. She is on Decadron 2 mg twice a day currently. CBG (last 3)   Recent Labs  04/10/13 1630 04/10/13 2037 04/11/13 0728  GLUCAP 137* 135* 132*     6. ABLA: Will recheck on Monday.  Hemoglobin & Hematocrit     Component Value Date/Time   HGB 10.7* 04/05/2013 0531   HCT 31.1* 04/05/2013 0531     7. Constipation: Resolved  8. Hypothyroid: continue supplement 9.  UTI vs colonization- on Keflex  LOS (Days) 9 A FACE TO FACE EVALUATION WAS PERFORMED  Quaron Delacruz [facility] 04/11/2013 8:28 AM

## 2013-04-11 NOTE — Progress Notes (Signed)
Physical Therapy Session Note  Patient Details  Name: Meghan Yu MRN: [id number] Date of Birth: 1931/12/27  Today's Date: 04/11/2013 Time: 1400-1450 Time Calculation (min): 50 min    Skilled Therapeutic Interventions/Progress Updates:   Patient in bed at the beginning of therapy,supine to sit with supervision, transfer to w/c with min guard. W/c propulsion to the gym with min A due to difficulty in following straight path, R side pull. Sit to stand training 10 x w/o AD, static standing 2 x 1 min, dynamic balance training in standing including ROM arch for UE while standing on foam surface with min A, single limb standing.Tandem walking with SCP, side stepping w/o AD only hand held assist. Gait training on a distance of 150 x2 with SPC, with contact guard, maneuvering in small spaces and avoiding obstacles, all activities with CGA. Stairs 2 x 8 steps with supervision wit one L side rail. Patient returned to the room ,in bed with alarm on and all needs within reach.  Therapy Documentation Precautions:  Precautions Precautions: Fall Restrictions Weight Bearing Restrictions: No Ambulation Ambulation/Gait Assistance: 4: Min guard   See FIM for current functional status  Therapy/Group: Individual Therapy  Dorna Mai 04/11/2013, 3:01 PM

## 2013-04-12 ENCOUNTER — Inpatient Hospital Stay (HOSPITAL_COMMUNITY): Payer: Medicare Other | Admitting: Physical Therapy

## 2013-04-12 ENCOUNTER — Inpatient Hospital Stay (HOSPITAL_COMMUNITY): Payer: Medicare Other | Admitting: Occupational Therapy

## 2013-04-12 ENCOUNTER — Inpatient Hospital Stay (HOSPITAL_COMMUNITY): Payer: Medicare Other | Admitting: Speech Pathology

## 2013-04-12 LAB — GLUCOSE, CAPILLARY: Glucose-Capillary: 122 mg/dL — ABNORMAL HIGH (ref 70–99)

## 2013-04-12 NOTE — Progress Notes (Addendum)
Occupational Therapy Session Note  Patient Details  Name: Meghan Yu MRN: [id number] Date of Birth: Aug 14, 1932  Today's Date: 04/12/2013 Time: 4098-1191 and 4782-9562 Time Calculation (min): 42 min and 45 min  Short Term Goals: Week 2:  OT Short Term Goal 1 (Week 2): STG = LTGs due to remaining LOS  Skilled Therapeutic Interventions/Progress Updates:    1) Pt seen for ADL retraining with focus on functional mobility and attention to Rt visual field during self-care tasks. Pt reports not wanting to bathe at shower level this session, however willing to bathe at sink. Pt overall supervision with self-care tasks of bathing and dressing at sit to stand level, requiring min cues for threading Rt pant leg. Engaged in grooming in standing with no LOB this session. Ambulated around room to gather clothing without AD with pt requiring increased cues for RLE placement as it would tend to drag.  2) Pt seen for 1:1 OT with focus on functional mobility in home environment and kitchen with simple meal prep with RW.  Pt required min cues for safety awareness with scrambling an egg.  Pt obtained all necessary items from refrigerator and cabinets with close supervision.  Question cues regarding safety with placement of objects and when reaching across hot burner, with pt unable to verbalize why they were unsafe.  Strongly suggest pt have full supervision when cooking.  Pt engaged in bimanual task with washing dishes followed by wiping counters.  Educated on hand placement with sit to stand from low kitchen chair without arm rest to increase safety.  Pt ambulated back to room with supervision and RW.  Completed toilet transfer and toileting with distant supervision.  Provided pt with bag for RW.  Therapy Documentation Precautions:  Precautions Precautions: Fall Restrictions Weight Bearing Restrictions: No Pain: Pain Assessment Pain Assessment: No/denies pain Pain Score: 0-No pain  See FIM for current  functional status  Therapy/Group: Individual Therapy  Leonette Monarch 04/12/2013, 11:00 AM

## 2013-04-12 NOTE — Progress Notes (Signed)
Patient ID: Meghan Yu, female   DOB: 17-May-1932, 79 y.o.   MRN: [id number] Patient alert oriented. Patient in rehab, mobilizing well. Right side working well.   Incision is clean and dry. Sutures and staples to be removed.

## 2013-04-12 NOTE — Progress Notes (Signed)
Physical Therapy Session Note  Patient Details  Name: Meghan Yu MRN: [id number] Date of Birth: [birth year]  Today's Date: 04/12/2013 Time: 4098-1191 and [id number] Time Calculation (min): 58 and 30 min  Short Term Goals: Week 2:  PT Short Term Goal 1 (Week 2): = LTG  Skilled Therapeutic Interventions/Progress Updates:   Pt seen for two physical therapy sessions in pm: first session with focus on NMR and trunk control training.  Second session to focus on NMR during dynamic gait for weight shifting, postural control and balance.  See below for details.  Therapy Documentation Precautions:  Precautions Precautions: Fall Restrictions Weight Bearing Restrictions: No Vital Signs: Therapy Vitals Temp: 98.2 F (36.8 C) Temp src: Oral Pulse Rate: 91 Resp: 18 BP: 121/73 mmHg Patient Position, if appropriate: Lying Oxygen Therapy SpO2: 94 % Pain: Pain Assessment Pain Assessment: No/denies pain Mobility:  Pt performed supine <> sit from flat bed, no rail x 2 reps with supervision and extra time.  Performed sit <> stand multiple times from various surfaces min A with intermittent verbal cues for hand placement when standing.  Performed ambulation x 150' x 4 reps with RW in controlled environment with supervision but intermittent verbal and tactile cues for upright posture, shoulder ER/retraction to maintain safe distance to RW and maintain straight navigation in crowded/busy environments.   Locomotion : Ambulation Ambulation/Gait Assistance: 5: Supervision High Level Ambulation High Level Ambulation: Side stepping;Backwards walking;Other high level ambulation High Level Ambulation - Other Comments: Tandem walking in controlled environment x 25' each direction x 2 reps with min-mod A to maintain pelvic position and lateral weight shifting during side stepping with verbal cues for full RLE lateral step length; during retro stepping required mod A for alternating pelvic posterior rotations  and weight shifting and verbal cues for full step length.  Tandem walking for increased hip strategy training with min-mod A with intermittent verbal cues for initiation and to maintain upright posture and shoulder ER and retraction.   Other Treatments: Treatments Neuromuscular Facilitation: Activity to increase motor control;Right;Activity to increase lateral weight shifting;Activity to increase anterior-posterior weight shifting;Lower Extremity;Activity to increase timing and sequencing;Activity to increase sustained activation beginning on mat during multiple sit > stand and in prolonged standing with R foot wegded with LUE reaching up and forwards and to the L to place horseshoes on tall target and then remove from target to facilitate L lateral weight shifting, trunk elongation and activation of RLE extensors for weight shift and sit >stand with mod A secondary to tendency for R lateral lean and LOB.  Also performed trunk and head righting training sitting on pink disc beginning with bilat feet supported with focus on lateral weight shifting with trunk elongation and shortening and pelvic depression and elevation while maintaining head and trunk over BOS (to minimize tipping and lateral lean to side with LOB) with mod-max verbal and tactile cues.  Progressed to maintaining trunk and head position over BOS during alternating LE LAQ with mod A to maintain trunk position and balance secondary to pt pushing posterior.  Pt remained in gym until time for OT session 15 min later.    See FIM for current functional status  Therapy/Group: Individual Therapy  Meghan Yu [facility] 04/12/2013, 4:03 PM

## 2013-04-12 NOTE — Progress Notes (Signed)
Speech Language Pathology Daily Session Note & Weekly Progress Note  Patient Details  Name: Meghan Yu MRN: [id number] Date of Birth: Dec 29, 1931  Today's Date: 04/12/2013 Time: 3086-5784 Time Calculation (min): 30 min  Short Term Goals: Week 1: SLP Short Term Goal 1 (Week 1): The patient will demonstrate functional problem solving with basic and familiar tasks with min A multimodal cueing.  SLP Short Term Goal 2 (Week 1): The patient will utilize external memory aids to recall information with min A multimodal cueing.   SLP Short Term Goal 3 (Week 1): Patient will identify two physical deficits that may hinder safety, with [facility] A questions cues.   Skilled Therapeutic Interventions: Skilled treatment session focused on addressing cognitive-linguistic goals.  SLP facilitated session with a categorical naming task in a mildly distracting environment and Max faded to Mod assist verbal/semantic cues to initiate requests for help to utilize word finding strategies during task.  Patient better able to carryover use of strategies by the end of the session.  Continue with current plan of care.   FIM:  Comprehension Comprehension Mode: Auditory Comprehension: 5-Follows basic conversation/direction: With extra time/assistive device Expression Expression Mode: Verbal Expression: 5-Expresses complex 90% of the time/cues < 10% of the time Social Interaction Social Interaction: 4-Interacts appropriately 75 - 89% of the time - Needs redirection for appropriate language or to initiate interaction. Problem Solving Problem Solving: 3-Solves basic 50 - 74% of the time/requires cueing 25 - 49% of the time Memory Memory: 4-Recognizes or recalls 75 - 89% of the time/requires cueing 10 - 24% of the time FIM - Eating Eating Activity: 5: Set-up assist for open containers  Pain Pain Assessment Pain Assessment: No/denies pain Pain Score: 0-No pain  Therapy/Group: Individual Therapy   Speech Language  Pathology Weekly Progress Note  Patient Details  Name: Meghan Yu MRN: [id number] Date of Birth: [birth year]  Today's Date: 04/12/2013  Short Term Goals: Week 1: SLP Short Term Goal 1 (Week 1): The patient will demonstrate functional problem solving with basic and familiar tasks with min A multimodal cueing.  SLP Short Term Goal 1 - Progress (Week 1): Progressing toward goal SLP Short Term Goal 2 (Week 1): The patient will utilize external memory aids to recall information with min A multimodal cueing.   SLP Short Term Goal 2 - Progress (Week 1): Progressing toward goal SLP Short Term Goal 3 (Week 1): Patient will identify two physical deficits that may hinder safety, with [facility] A questions cues.  SLP Short Term Goal 3 - Progress (Week 1): Progressing toward goal Week 2: SLP Short Term Goal 1 (Week 2): The patient will demonstrate functional problem solving with basic and familiar tasks with min A multimodal cueing.  SLP Short Term Goal 2 (Week 2): The patient will utilize external memory aids to recall information with min A multimodal cueing.   SLP Short Term Goal 3 (Week 2): Patient will identify two physical deficits that may hinder safety, with [facility] A questions cues.   Weekly Progress Updates: Patient is progressing toward meeting her long term goals by her anticipated discharge date of 04/15/13 with overall Min assist due to cognitive deficits.  Patient is currently requiring Min-Mod assist.  Patient and family report that her husband will be her caretaker; however, family education still needs to be completed prior to discharge to ensure safety to due to increased level of care when compared to baseline.    SLP Intensity: Minumum of 1-2 x/day, 30 to 90 minutes SLP Frequency: 5  out of 7 days SLP Duration/Estimated Length of Stay: 04/15/13 SLP Treatment/Interventions: Cognitive remediation/compensation;Patient/family education;Therapeutic Activities  Charlane Ferretti.,  CCC-SLP [phone]  Meghan Yu 04/12/2013, 1:15 PM

## 2013-04-12 NOTE — Progress Notes (Signed)
Patient ID: [name], female   DOB: 1932/08/03, 79 y.o.   MRN: [id number] Subjective/Complaints:  Are my staples coming out tomorrow? Oriented to person place and day/date A 12 point review of systems has been performed and if not noted above is otherwise negative.   Objective: Vital Signs: Blood pressure 134/70, pulse 70, temperature 97.8 F (36.6 C), temperature source Oral, resp. rate 16, height 5\' 1"  (1.549 m), weight 55 kg (121 lb 4.1 oz), SpO2 96.00%. No results found. No results found for this basename: WBC, HGB, HCT, PLT,  in the last 72 hours No results found for this basename: NA, K, CL, CO, GLUCOSE, BUN, CREATININE, CALCIUM,  in the last 72 hours CBG (last 3)   Recent Labs  04/11/13 1145 04/11/13 2042 04/12/13 0721  GLUCAP 102* 133* 128*    Wt Readings from Last 3 Encounters:  04/02/13 55 kg (121 lb 4.1 oz)  03/26/13 60.782 kg (134 lb)  03/26/13 60.782 kg (134 lb)       Physical Exam:  Constitutional: She is oriented to person, place, and time. She appears well-developed and well-nourished.  HENT:  Scalp incision clean and dry with staples and sutures in place.  Eyes: Pupils are equal, round, and reactive to light.  Neck: Normal range of motion.  Cardiovascular: Normal rate and regular rhythm.  Pulmonary/Chest: Effort normal and breath sounds normal. No respiratory distress.  Abdominal: Soft. Bowel sounds are normal. She exhibits no distension. There is no tenderness.  Musculoskeletal: She exhibits edema (right knee).  Neurological: She is alert and oriented to person, place, and time.  Flat affect, looks straight ahead but able to turn to left with cues. Denies dizziness. No nystagmus noted. Mild right facial weakness. No dysarthria. Voice is hoarse. Delayed output with measured movements. Able to follow basic commands. Oriented to self, place, situation, age/DOB. Decreased awareness of deficits. RUE 4/5. LUE 4+/5. RLE is 4/5. LLE is 4+/5. S. Decreased FMC  of right arm and leg.  Skin: Skin is warm and dry. bruising along left neck as well as bilateral UE's.  motor strength 2 minus in the right deltoid, biceps, triceps, grip  3 minus the right hip flexors knee extensor 2 minus in ankle dorsiflexor plantar flexor  5/5 in the left deltoid, biceps, triceps, grip, hip flexor, knee extensors, ankle dorsiflexor plantar flexor  Sensation reduced to light touch in the right upper and right lower extremity normal in the left upper and left lower extremity  Psych:  No emotional lability  Assessment/Plan: 1. Functional deficits secondary to right frontal meningioma s/p resection which require 3+ hours per day of interdisciplinary therapy in a comprehensive inpatient rehab setting. Physiatrist is providing close team supervision and 24 hour management of active medical problems listed below. Physiatrist and rehab team continue to assess barriers to discharge/monitor patient progress toward functional and medical goals.   FIM: FIM - Bathing Bathing Steps Patient Completed: Chest;Right Arm;Left Arm;Abdomen;Front perineal area;Buttocks;Right upper leg;Left upper leg Bathing: 5: Set-up assist to: Obtain items  FIM - Upper Body Dressing/Undressing Upper body dressing/undressing steps patient completed: Thread/unthread left sleeve of pullover shirt/dress;Thread/unthread right sleeve of pullover shirt/dresss;Pull shirt over trunk;Put head through opening of pull over shirt/dress Upper body dressing/undressing: 5: Set-up assist to: Obtain clothing/put away FIM - Lower Body Dressing/Undressing Lower body dressing/undressing steps patient completed: Thread/unthread left pants leg;Pull pants up/down;Pull underwear up/down;Thread/unthread right pants leg;Don/Doff right sock;Don/Doff left sock;Don/Doff right shoe;Don/Doff left shoe;Fasten/unfasten right shoe;Fasten/unfasten left shoe;Thread/unthread right underwear leg;Thread/unthread left underwear leg Lower  body  dressing/undressing: 4: Steadying Assist  FIM - Toileting Toileting steps completed by patient: Adjust clothing prior to toileting;Performs perineal hygiene;Adjust clothing after toileting Toileting Assistive Devices: Grab bar or rail for support Toileting: 4: Steadying assist  FIM - Diplomatic Services operational officer Devices: Grab bars;Walker;Elevated toilet seat Toilet Transfers: 4-To toilet/BSC: Min A (steadying Pt. > 75%);4-From toilet/BSC: Min A (steadying Pt. > 75%)  FIM - Bed/Chair Transfer Bed/Chair Transfer Assistive Devices: Therapist, occupational: 5: Supine > Sit: Supervision (verbal cues/safety issues);5: Sit > Supine: Supervision (verbal cues/safety issues);5: Bed > Chair or W/C: Supervision (verbal cues/safety issues)  FIM - Locomotion: Wheelchair Distance: 150 Locomotion: Wheelchair: 4: Travels 150 ft or more: maneuvers on rugs and over door sillls with minimal assistance (Pt.>75%) FIM - Locomotion: Ambulation Locomotion: Ambulation Assistive Devices: Patent attorney - Rolling Ambulation/Gait Assistance: 4: Min guard Locomotion: Ambulation: 4: Travels 150 ft or more with minimal assistance (Pt.>75%)  Comprehension Comprehension Mode: Auditory Comprehension: 5-Follows basic conversation/direction: With extra time/assistive device  Expression Expression Mode: Verbal Expression: 6-Expresses complex ideas: With extra time/assistive device  Social Interaction Social Interaction: 4-Interacts appropriately 75 - 89% of the time - Needs redirection for appropriate language or to initiate interaction.  Problem Solving Problem Solving: 4-Solves basic 75 - 89% of the time/requires cueing 10 - 24% of the time  Memory Memory: 4-Recognizes or recalls 75 - 89% of the time/requires cueing 10 - 24% of the time Medical Problem List and Plan:  1. DVT Prophylaxis/Anticoagulation: Mechanical: Sequential compression devices, below knee Bilateral lower extremities   2. Pain Management: Headaches resolved. Use tylenol prn  3. Mood: Anxiety seems to be controlled except at night. Resumed home hs dose of xanax.  -monitor clinically for am sedation--none at present 4. Neuropsych: This patient is capable of making decisions on her own behalf.  5. Steroid induced hyperglycemia: Will monitor BS with AC/HS checks. Use SSI as needed for elevated BS.   -wean steroids 6. ABLA: Will recheck on Monday.  7. Constipation: Resolved with miralax. Will back off on laxatives due to history of bowel urgency--use prn.  8. Hypothyroid: continue supplement 9.  UTI vs colonization- will treat secondary to immunocompromise from steroids  LOS (Days) 10 A FACE TO FACE EVALUATION WAS PERFORMED  Erick Colace 04/12/2013 8:33 AM

## 2013-04-13 ENCOUNTER — Inpatient Hospital Stay (HOSPITAL_COMMUNITY): Payer: Medicare Other | Admitting: Occupational Therapy

## 2013-04-13 ENCOUNTER — Inpatient Hospital Stay (HOSPITAL_COMMUNITY): Payer: Medicare Other | Admitting: Physical Therapy

## 2013-04-13 ENCOUNTER — Inpatient Hospital Stay (HOSPITAL_COMMUNITY): Payer: Medicare Other | Admitting: *Deleted

## 2013-04-13 ENCOUNTER — Inpatient Hospital Stay (HOSPITAL_COMMUNITY): Payer: Medicare Other | Admitting: Speech Pathology

## 2013-04-13 DIAGNOSIS — G8110 Spastic hemiplegia affecting unspecified side: Secondary | ICD-10-CM

## 2013-04-13 LAB — GLUCOSE, CAPILLARY
Glucose-Capillary: 119 mg/dL — ABNORMAL HIGH (ref 70–99)
Glucose-Capillary: 145 mg/dL — ABNORMAL HIGH (ref 70–99)

## 2013-04-13 NOTE — Progress Notes (Signed)
Patient ID: Meghan Yu, female   DOB: 1932/06/17, 79 y.o.   MRN: [id number] Subjective/Complaints: Sutures removed Oriented to person place and day/date A 12 point review of systems has been performed and if not noted above is otherwise negative.   Objective: Vital Signs: Blood pressure 138/76, pulse 69, temperature 97.6 F (36.4 C), temperature source Oral, resp. rate 18, height 5\' 1"  (1.549 m), weight 55 kg (121 lb 4.1 oz), SpO2 95.00%. No results found. No results found for this basename: WBC, HGB, HCT, PLT,  in the last 72 hours No results found for this basename: NA, K, CL, CO, GLUCOSE, BUN, CREATININE, CALCIUM,  in the last 72 hours CBG (last 3)   Recent Labs  04/12/13 1618 04/12/13 2046 04/13/13 0715  GLUCAP 159* 122* 145*    Wt Readings from Last 3 Encounters:  04/02/13 55 kg (121 lb 4.1 oz)  03/26/13 60.782 kg (134 lb)  03/26/13 60.782 kg (134 lb)       Physical Exam:  Constitutional: She is oriented to person, place, and time. She appears well-developed and well-nourished.  HENT:  Scalp incision clean and dry with staples and sutures in place.  Eyes: Pupils are equal, round, and reactive to light.  Neck: Normal range of motion.  Cardiovascular: Normal rate and regular rhythm.  Pulmonary/Chest: Effort normal and breath sounds normal. No respiratory distress.  Abdominal: Soft. Bowel sounds are normal. She exhibits no distension. There is no tenderness.  Musculoskeletal: She exhibits edema (right knee).  Neurological: She is alert and oriented to person, place, and time.  Flat affect, looks straight ahead but able to turn to left with cues. Denies dizziness. No nystagmus noted. Mild right facial weakness. No dysarthria. Voice is hoarse. Delayed output with measured movements. Able to follow basic commands. Oriented to self, place, situation, age/DOB. Decreased awareness of deficits. RUE 4/5. LUE 4+/5. RLE is 4/5. LLE is 4+/5. S. Decreased FMC of right arm and leg.   Skin: Skin is warm and dry. bruising along left neck as well as bilateral UE's.  motor strength 4 minus in the right deltoid, biceps, triceps, grip  4 minus the right hip flexors knee extensor 4 minus in ankle dorsiflexor plantar flexor  5/5 in the left deltoid, biceps, triceps, grip, hip flexor, knee extensors, ankle dorsiflexor plantar flexor  Sensation reduced to light touch in the right upper and right lower extremity normal in the left upper and left lower extremity  Psych:  No emotional lability  Assessment/Plan: 1. Functional deficits secondary to right frontal meningioma s/p resection which require 3+ hours per day of interdisciplinary therapy in a comprehensive inpatient rehab setting. Physiatrist is providing close team supervision and 24 hour management of active medical problems listed below. Physiatrist and rehab team continue to assess barriers to discharge/monitor patient progress toward functional and medical goals.   FIM: FIM - Bathing Bathing Steps Patient Completed: Chest;Right Arm;Left Arm;Abdomen;Front perineal area;Buttocks;Right upper leg;Left upper leg Bathing: 5: Set-up assist to: Obtain items  FIM - Upper Body Dressing/Undressing Upper body dressing/undressing steps patient completed: Thread/unthread left sleeve of pullover shirt/dress;Thread/unthread right sleeve of pullover shirt/dresss;Pull shirt over trunk;Put head through opening of pull over shirt/dress Upper body dressing/undressing: 5: Set-up assist to: Obtain clothing/put away FIM - Lower Body Dressing/Undressing Lower body dressing/undressing steps patient completed: Thread/unthread left pants leg;Pull pants up/down;Pull underwear up/down;Thread/unthread right pants leg;Don/Doff right sock;Don/Doff left sock;Thread/unthread right underwear leg;Thread/unthread left underwear leg Lower body dressing/undressing: 5: Set-up assist to: Obtain clothing  FIM - Toileting Toileting  steps completed by patient:  Adjust clothing prior to toileting;Performs perineal hygiene;Adjust clothing after toileting Toileting Assistive Devices: Grab bar or rail for support Toileting: 5: Supervision: Safety issues/verbal cues  FIM - Diplomatic Services operational officer Devices: Art gallery manager Transfers: 6-Assistive device: No helper;5-To toilet/BSC: Supervision (verbal cues/safety issues);5-From toilet/BSC: Supervision (verbal cues/safety issues)  FIM - Banker Devices: Therapist, occupational: 5: Supine > Sit: Supervision (verbal cues/safety issues);5: Sit > Supine: Supervision (verbal cues/safety issues);5: Bed > Chair or W/C: Supervision (verbal cues/safety issues);5: Chair or W/C > Bed: Supervision (verbal cues/safety issues)  FIM - Locomotion: Wheelchair Distance: 150 Locomotion: Wheelchair: 0: Activity did not occur FIM - Locomotion: Ambulation Locomotion: Ambulation Assistive Devices: Designer, industrial/product Ambulation/Gait Assistance: 5: Supervision Locomotion: Ambulation: 5: Travels 150 ft or more with supervision/safety issues  Comprehension Comprehension Mode: Auditory Comprehension: 5-Follows basic conversation/direction: With extra time/assistive device  Expression Expression Mode: Verbal Expression: 5-Expresses basic 90% of the time/requires cueing < 10% of the time.  Social Interaction Social Interaction: 4-Interacts appropriately 75 - 89% of the time - Needs redirection for appropriate language or to initiate interaction.  Problem Solving Problem Solving: 3-Solves basic 50 - 74% of the time/requires cueing 25 - 49% of the time  Memory Memory: 4-Recognizes or recalls 75 - 89% of the time/requires cueing 10 - 24% of the time Medical Problem List and Plan:  1. DVT Prophylaxis/Anticoagulation: Mechanical: Sequential compression devices, below knee Bilateral lower extremities  2. Pain Management: Headaches resolved. Use tylenol prn  3. Mood:  Anxiety seems to be controlled except at night. Resumed home hs dose of xanax.  -monitor clinically for am sedation--none at present 4. Neuropsych: This patient is capable of making decisions on her own behalf.  5. Steroid induced hyperglycemia: Will monitor BS with AC/HS checks. Use SSI as needed for elevated BS.   -wean steroids 6. ABLA: Will recheck on Monday.  7. Constipation: Resolved with miralax. Will back off on laxatives due to history of bowel urgency--use prn.  8. Hypothyroid: continue supplement 9.  UTI vs colonization- will treat secondary to immunocompromise from steroids  LOS (Days) 11 A FACE TO FACE EVALUATION WAS PERFORMED  Claudette Laws E 04/13/2013 8:14 AM

## 2013-04-13 NOTE — Progress Notes (Signed)
Speech Language Pathology Daily Session Note  Patient Details  Name: Meghan Yu MRN: [id number] Date of Birth: [birth year]  Today's Date: 04/13/2013 Time: 0915-1000 Time Calculation (min): 45 min  Short Term Goals: Week 2: SLP Short Term Goal 1 (Week 2): The patient will demonstrate functional problem solving with basic and familiar tasks with min A multimodal cueing.  SLP Short Term Goal 2 (Week 2): The patient will utilize external memory aids to recall information with min A multimodal cueing.   SLP Short Term Goal 3 (Week 2): Patient will identify two physical deficits that may hinder safety, with [facility] A questions cues.   Skilled Therapeutic Interventions: Skilled treatment session focused on addressing cognitive-linguistic goals.  SLP facilitated session with a categorical naming task in a mildly distracting environment with Mod faded to Min assist verbal cues to utilize word finding strategies.  Patient demonstrated increased carryover, suspect from yesterday's session.  SLP also facilitated session with prep for outing and discussion regarding patient being responsible for locating 3-4 items in the store.  Patient was unable to anticipate how to assist self with recall and SLP educated regarding use of external aids.  Patient required Min assist cues to accurately write down items needed from store to assist with later recall.  Continue with current plan of care.  Family education needs to be completed.   FIM:  Comprehension Comprehension Mode: Auditory Comprehension: 6-Follows complex conversation/direction: With extra time/assistive device Expression Expression Mode: Verbal Expression: 6-Expresses complex ideas: With extra time/assistive device Social Interaction Social Interaction: 6-Interacts appropriately with others with medication or extra time (anti-anxiety, antidepressant). Problem Solving Problem Solving: 4-Solves basic 75 - 89% of the time/requires cueing 10 - 24% of the  time Memory Memory: 4-Recognizes or recalls 75 - 89% of the time/requires cueing 10 - 24% of the time FIM - Eating Eating Activity: 6: Assistive device: dentures  Pain Pain Assessment Pain Assessment: No/denies pain  Therapy/Group: Individual Therapy  Charlane Ferretti., CCC-SLP [phone]  Meghan Yu 04/13/2013, 1:32 PM

## 2013-04-13 NOTE — Progress Notes (Signed)
Social Work Patient ID: Meghan Yu, female   DOB: [birth year], 79 y.o.   MRN: [id number] Met with pt and husband who was here visiting and confirmed family education tomorrow at 9;00.  He reports their daughter s also coming With.  Aware pt will require 24 hour supervision and plan to provide this.  Aware discharge 6/26-Thurs.  See how family education goes tomorrow.

## 2013-04-13 NOTE — Progress Notes (Signed)
Occupational Therapy Session Note  Patient Details  Name: Meghan Yu MRN: [id number] Date of Birth: December 26, 1931  Today's Date: 04/13/2013 Time: 0730-0820 and 1020-1200 Time Calculation (min): 50 min and 100 min  Short Term Goals: Week 2:  OT Short Term Goal 1 (Week 2): STG = LTGs due to remaining LOS  Skilled Therapeutic Interventions/Progress Updates:    1) Pt seen for ADL retraining with focus on functional mobility and safety with self-care tasks.  Pt demonstrated increased awareness of Rt visual field with scanning to Rt to obtain all necessary items during bathing and grooming.  Pt with increased balance this session with doffing underwear and socks in standing with UE support on RW.  Min cues for sequencing with bathing this session. Donned underwear and pants at sit to stand level with no cues or physical assistance this session.  Pt donned socks and shoes this session with increased time.    2) Pt participated in community outing with another patient. Skilled intervention focused on community mobility (mobility on various surfaces, ambulating up/down van steps), car transfers, functional mobility, activity tolerance, and education on safety in the community. Pt demonstrated safety with negotiating around people and obstacles in crowded environment with supervision.  Min assist and min cues for safe stepping sequence with van steps.  Pt utilized external aids to obtain items on list with min cues to utilize.  Pt able to verbalize 2-3 ways to transport items while maintaining safety with RW.  Therapy Documentation Precautions:  Precautions Precautions: Fall Restrictions Weight Bearing Restrictions: Yes Pain:  Pt with no c/o pain this session.  See FIM for current functional status  Therapy/Group: Individual Therapy  Leonette Monarch 04/13/2013, 8:25 AM

## 2013-04-13 NOTE — Progress Notes (Signed)
Social Work Patient ID: Meghan Yu, female   DOB: [birth year], 79 y.o.   MRN: [id number] Spoke with husband-Bob to arrange family education tomorrow at 9;00-12;00 so he can see what he needs to assist pt at home. Discussed he will need to manage her medications and finances, or discuss with daughter.  Pt will require 24 hour supervision at Discharge.

## 2013-04-13 NOTE — Progress Notes (Signed)
NUTRITION FOLLOW UP  Intervention:   1. Glucerna Shake po daily, each supplement provides 220 kcal and 10 grams of protein.  2. MVI daily  3. RD to continue to follow nutrition care plan.  Nutrition Dx:   Increased nutrient needs related to healing and participation in rehab therapies as evidenced by estimated needs. Ongoing.  Goal:   Intake to meet >90% of estimated nutrition needs. Met.  Monitor:   weight trends, lab trends, I/O's, PO intake, supplement tolerance  Assessment:   Admitted 6/6 with lethargy and R-sided weakness. CT head revealed large left hemispheric frontal and posterior frontal extra-axial mass likely meningioma with significant vasogenic edema and 1 cm left to right shift. On 6/10 underwent L-frontal craniotomy with gross total resection of tumor.   Eating 100% of Carbohydrate Modified Medium meals. We reviewed how the Carbohydrate Modified Medium (1600 - 2000) diet order works and we discussed distribution of carbohydrate grams throughout the day.  Suspect that pt is eating well enough at this time to meet estimated nutritional needs.  This is a weight change of 10% x 2 weeks and is significant. Suspect some level of malnutrition given recent weight loss of 10% x 2 weeks.   Height: Ht Readings from Last 1 Encounters:  04/02/13 5\' 1"  (1.549 m)    Weight Status:   Wt Readings from Last 1 Encounters:  04/02/13 121 lb 4.1 oz (55 kg)  No new weight.  Re-estimated needs:  Kcal: 1300 - 1500 Protein: 69 - 80 grams Fluid: 1.3 - 1.6 liters daily  Skin: incision of head  Diet Order: Carb Control Medium   Intake/Output Summary (Last 24 hours) at 04/13/13 1024 Last data filed at 04/13/13 0800  Gross per 24 hour  Intake    840 ml  Output      0 ml  Net    840 ml    Last BM: 6/23   Labs:   Recent Labs Lab 04/07/13 0630  NA 135  K 4.1  CL 101  CO2 29  BUN 19  CREATININE 0.58  CALCIUM 8.7  GLUCOSE 132*    CBG (last 3)   Recent Labs  04/12/13 1618 04/12/13 2046 04/13/13 0715  GLUCAP 159* 122* 145*    Scheduled Meds: . ALPRAZolam  2 mg Oral QHS  . dexamethasone  2 mg Oral Q12H  . feeding supplement  237 mL Oral Q24H  . insulin aspart  0-9 Units Subcutaneous TID WC  . levETIRAcetam  500 mg Oral BID  . levothyroxine  75 mcg Oral QAC breakfast  . magnesium oxide  200 mg Oral Daily  . multivitamin with minerals  1 tablet Oral Daily  . simvastatin  20 mg Oral Q supper    Continuous Infusions:  none  Jarold Motto MS, RD, LDN Pager: [phone] After-hours pager: [phone]

## 2013-04-13 NOTE — Progress Notes (Signed)
Physical Therapy Session Note  Patient Details  Name: Meghan Yu MRN: [id number] Date of Birth: [birth year]  Today's Date: 04/13/2013 Time: 4098-1191 Time Calculation (min): 42 min  Short Term Goals: Week 1:  PT Short Term Goal 1 (Week 1): Pt will peform bed mobility on flat bed, no rail with supervision PT Short Term Goal 1 - Progress (Week 1): Progressing toward goal PT Short Term Goal 2 (Week 1): Pt will peformed bed <> w/c transfers with min A  PT Short Term Goal 2 - Progress (Week 1): Met PT Short Term Goal 3 (Week 1): Pt will perform ambulation in controlled environment with LRAD and min A x 100' PT Short Term Goal 3 - Progress (Week 1): Met PT Short Term Goal 4 (Week 1): Pt will perform stair negotiation up and down 4 stairs with one rail and min A PT Short Term Goal 4 - Progress (Week 1): Met Week 2:  PT Short Term Goal 1 (Week 2): = LTG  Skilled Therapeutic Interventions/Progress Updates:   Pt reporting good outing this am to [employer].  Reports having no difficulty with ambulation and obstacle negotiation around the store.  Does reports a little fatigue and requesting to go to bed at end of session.  Therapy session to focus on stair negotiation training for home entry exit at house [place].  Performed ambulation in controlled environment x 150' x 2 reps with supervision and no verbal cues to maintain straight navigation today.  Pt reports that at her husband's home there is only one step onto a porch.  Performed one step up training with verbal cues and demonstration with patient giving repeat demonstrationg with RW x 2 reps with supervision but verbal cues for safe sequence.  Also performed up and down 3 stairs x 2 reps with L rail only with supervision with alternating sequence to ascend and step to sequence to descend.  Performed dynamic standing balance training and word finding activity standing on solid surface with feet apart >> feet together  >> R and L semi tandem stance during letter ball toss in various directions out of BOS and reaching to floor during letter ball roll and then naming food associated with letter on ball. With one LOB during semi tandem stance with pt sitting back on mat.  Returned to room with supervision and performed sit > supine on bed supervision but required min A to maintain feet positioning in bed for bridging to reposition in bed.    Therapy Documentation Precautions:  Precautions Precautions: Fall Restrictions Weight Bearing Restrictions: No Pain: Pain Assessment Pain Assessment: No/denies pain Locomotion : Ambulation Ambulation/Gait Assistance: 5: Supervision   See FIM for current functional status  Therapy/Group: Individual Therapy  [name] [facility] 04/13/2013, 1:49 PM

## 2013-04-14 ENCOUNTER — Inpatient Hospital Stay (HOSPITAL_COMMUNITY): Payer: Medicare Other | Admitting: Speech Pathology

## 2013-04-14 ENCOUNTER — Inpatient Hospital Stay (HOSPITAL_COMMUNITY): Payer: Medicare Other | Admitting: Occupational Therapy

## 2013-04-14 ENCOUNTER — Ambulatory Visit (HOSPITAL_COMMUNITY): Payer: PRIVATE HEALTH INSURANCE | Admitting: Physical Therapy

## 2013-04-14 ENCOUNTER — Inpatient Hospital Stay (HOSPITAL_COMMUNITY): Payer: PRIVATE HEALTH INSURANCE | Admitting: Physical Therapy

## 2013-04-14 LAB — GLUCOSE, CAPILLARY
Glucose-Capillary: 122 mg/dL — ABNORMAL HIGH (ref 70–99)
Glucose-Capillary: 100 mg/dL — ABNORMAL HIGH (ref 70–99)

## 2013-04-14 MED ORDER — LEVETIRACETAM 500 MG PO TABS
500.0000 mg | ORAL_TABLET | Freq: Two times a day (BID) | ORAL | Status: DC
  Administered 2013-04-14 – 2013-04-15 (×3): 500 mg via ORAL
  Filled 2013-04-14 (×5): qty 1

## 2013-04-14 MED ORDER — DEXAMETHASONE 0.5 MG PO TABS
1.0000 mg | ORAL_TABLET | Freq: Two times a day (BID) | ORAL | Status: DC
  Administered 2013-04-14 – 2013-04-15 (×3): 1 mg via ORAL
  Filled 2013-04-14 (×5): qty 2

## 2013-04-14 NOTE — Progress Notes (Signed)
Social Work Patient ID: Meghan Yu, female   DOB: 05-30-1932, 78 y.o.   MRN: [id number] Met with pt, husband and daughter who reports family education went well.  Pt is doing more now than she was at home. Discussed team conference progression toward goals and will be ready for discharge tomorrow.  All in agreement and ready for discharge. Follow up arranged via [facility] and DME delivered.

## 2013-04-14 NOTE — Progress Notes (Signed)
Physical Therapy Discharge Summary  Patient Details  Name: Meghan Yu MRN: [id number] Date of Birth: November 30, 1931  Today's Date: 04/14/2013 Time: 1130-1200 and 7829-5621 Time Calculation (min): 30 min and 54 min  Patient has met 10 of 10 long term goals due to improved activity tolerance, improved balance, improved postural control, increased strength, ability to compensate for deficits, functional use of  right upper extremity and right lower extremity, improved attention, improved awareness and improved coordination.  Patient to discharge at an ambulatory level Supervision.   Patient's care partner is independent to provide the necessary supervision assistance at discharge.  Reasons goals not met: All goals met  Recommendation:  Patient will benefit from ongoing skilled PT services in home health setting to continue to advance safe functional mobility, address ongoing impairments in R sided strength, motor control, timing/sequencing, apraxia, impaired postural control, dynamic balance, gait, and minimize fall risk.  Equipment: RW  Reasons for discharge: treatment goals met and discharge from hospital  Patient/family agrees with progress made and goals achieved: Yes  PT Discharge Precautions/Restrictions Precautions Precautions: Fall Pain Pain Assessment Pain Assessment: No/denies pain Vision/Perception  Vision - History Baseline Vision: Wears glasses only for reading Patient Visual Report: No change from baseline Vision - Assessment Eye Alignment: Within Functional Limits Perception Perception: Impaired Inattention/Neglect: Does not attend to right visual field (mild inattention to R environment) Praxis Praxis: Impaired Praxis Impairment Details: Motor planning  Cognition  Still presents with impaired attention, initiation, delayed processing  Sensation Sensation Light Touch: Appears Intact Stereognosis: Not tested Hot/Cold: Not tested Proprioception: Appears  Intact Coordination Gross Motor Movements are Fluid and Coordinated: No Coordination and Movement Description: Limited by weakness Motor  Motor Motor: Hemiplegia;Abnormal postural alignment and control;Motor apraxia Motor - Discharge Observations: R sided weakness, still presents with slight posterior and R LOB during dynamic activities; mild initiation and motor planning/sequencing impairments  Mobility Performed bed mobility and transfer training with family present.  Pt performed mod I bed mobility and stand pivot transfers with RW with supervision with intermittent cues to remain with RW when pivoting.  Discussed with and demonstrated to patient and family how to perform floor > furniture transfer secondary to pt h/o falls and husband inability to assist pt off the floor.  Also discussed indications for calling EMS.  Pt return demonstrated floor > furniture transfer with supervision and verbal cues for sequence and extra time to process cues and respond.  Pt also demonistrated safe simulated car transfer stand pivot with RW with supervision; husband has tall truck.  Also practiced tall truck transfer with step for running board with min A for safety.   Bed Mobility Bed Mobility: Sit to Supine;Supine to Sit Supine to Sit: 6: Modified independent (Device/Increase time) Sit to Supine: 6: Modified independent (Device/Increase time) Transfers Stand Pivot Transfers: Supervision (with RW) Locomotion  Ambulation Ambulation/Gait Assistance: 5: Supervision Ambulation Distance (Feet): 150 Feet Assistive device: Rolling walker Ambulation/Gait Assistance Details: In controlled, home and community environment with RW and supervision with intermittent verbal cues for upright posture and to maintain safe distance to RW Gait Gait Pattern: Step-through pattern;Decreased stance time - right;Lateral trunk lean to right;Decreased trunk rotation;Trunk flexed Stairs / Additional Locomotion Stairs: Yes Stairs  Assistance: 5: Supervision with verbal cues for safe step to sequencing. Stair Management Technique: One rail Left;Step to pattern;Forwards Number of Stairs: 12 Height of Stairs: 6.5 Wheelchair Mobility Wheelchair Mobility: No  Trunk/Postural Assessment  Cervical Assessment Cervical Assessment: Within Functional Limits Thoracic Assessment Thoracic Assessment: Exceptions to [facility] (  R posterior rotation and lower ribs flared) Lumbar Assessment Lumbar Assessment: Within Functional Limits Postural Control Postural Control: Deficits on evaluation Righting Reactions: Delayed ankle, hip and step strategies; intermittent posterior and R lateral LOB in sitting and standing  Balance Berg Balance Test Sit to Stand: Able to stand without using hands and stabilize independently Standing Unsupported: Able to stand safely 2 minutes Sitting with Back Unsupported but Feet Supported on Floor or Stool: Able to sit safely and securely 2 minutes Stand to Sit: Sits safely with minimal use of hands Transfers: Able to transfer safely, minor use of hands Standing Unsupported with Eyes Closed: Able to stand 10 seconds safely Standing Ubsupported with Feet Together: Able to place feet together independently and stand for 1 minute with supervision From Standing, Reach Forward with Outstretched Arm: Can reach confidently >25 cm (10") From Standing Position, Pick up Object from Floor: Able to pick up shoe safely and easily From Standing Position, Turn to Look Behind Over each Shoulder: Looks behind one side only/other side shows less weight shift Turn 360 Degrees: Able to turn 360 degrees safely but slowly Standing Unsupported, Alternately Place Feet on Step/Stool: Able to stand independently and complete 8 steps >20 seconds Standing Unsupported, One Foot in Front: Able to take small step independently and hold 30 seconds Standing on One Leg: Tries to lift leg/unable to hold 3 seconds but remains standing  independently Total Score: 46 Patient demonstrates increased fall risk as noted by score of 46/56 on Berg Balance Scale.  (<36= high risk for falls, close to 100%; 37-45 significant >80%; 46-51 moderate >50%; 52-55 lower >25%)  Discussed with patient her falls risk as indicated by Sharlene Motts balance assessment and continued use of RW to decrease falls risk.  Performed balance and weight shift training with forwards and retro stepping over walking stick on ground for increased foot clearance with min-mod A for anterior/posterior weight shifts and pelvic rotation.  Pt noted to have decreased R hip extension ROM; had patient stand with R foot back in partial tandem stance reaching with RUE up, forwards and to the L x 8 reps for horseshoes with focus on trunk rotation, elongation and increased terminal hip and knee extension on R.  Pt demonstrates improved retro stepping with UE support on RW.    Extremity Assessment  RUE Assessment RUE Assessment: Within Functional Limits (grossly 4/5) LUE Assessment LUE Assessment: Within Functional Limits (grossly 4/5) RLE Assessment RLE Assessment: Exceptions to [facility] RLE Strength RLE Overall Strength: Deficits RLE Overall Strength Comments: 2/5 hip flexion, 4-/5 knee extension, flexion and ankle DF LLE Assessment LLE Assessment: Within Functional Limits  See FIM for current functional status  [name] [facility] 04/14/2013, 1:37 PM

## 2013-04-14 NOTE — Progress Notes (Signed)
Occupational Therapy Discharge Summary  Patient Details  Name: Meghan Yu MRN: [id number] Date of Birth: 03/15/1932  Today's Date: 04/14/2013  Patient has met 12 of 12 long term goals due to improved activity tolerance, improved balance, postural control, ability to compensate for deficits, functional use of  RIGHT upper and RIGHT lower extremity, improved attention, improved awareness and improved coordination.  Patient to discharge at overall Supervision level.  Patient's care partner is independent to provide the necessary physical and cognitive assistance at discharge.    Reasons goals not met: N/A  Recommendation:  Patient will not require follow up OT at this time, however plans to have HHPT.  Equipment: shower chair  Reasons for discharge: treatment goals met and discharge from hospital  Patient/family agrees with progress made and goals achieved: Yes  OT Discharge Precautions/Restrictions  Precautions Precautions: Fall Pain Pain Assessment Pain Assessment: No/denies pain ADL ADL Grooming: Modified independent Where Assessed-Grooming: Sitting at sink Upper Body Bathing: Supervision/safety Where Assessed-Upper Body Bathing: Shower Lower Body Bathing: Supervision/safety Where Assessed-Lower Body Bathing: Shower Upper Body Dressing: Supervision/safety;Setup Where Assessed-Upper Body Dressing: Chair Lower Body Dressing: Supervision/safety;Setup Where Assessed-Lower Body Dressing: Chair Toileting: Supervision/safety Where Assessed-Toileting: Teacher, adult education: Distant supervision Statistician Method: Proofreader: Acupuncturist: Close supervision Film/video editor Method: Designer, industrial/product: Information systems manager with back;Grab bars Vision/Perception  Vision - History Baseline Vision: Wears glasses only for reading Patient Visual Report: No change from baseline Vision - Assessment Eye Alignment:  Within Functional Limits  Sensation Sensation Light Touch: Appears Intact Stereognosis: Not tested Hot/Cold: Not tested Proprioception: Appears Intact Coordination Gross Motor Movements are Fluid and Coordinated: No Coordination and Movement Description: Limited by weakness Motor  Motor Motor: Hemiplegia;Abnormal postural alignment and control;Motor apraxia Motor - Discharge Observations: R sided weakness, still presents with slight posterior and R LOB during dynamic activities; mild initiation and motor planning/sequencing impairments Mobility  Bed Mobility Bed Mobility: Sit to Supine;Supine to Sit Supine to Sit: 6: Modified independent (Device/Increase time) Sit to Supine: 6: Modified independent (Device/Increase time)  Trunk/Postural Assessment  Cervical Assessment Cervical Assessment: Within Functional Limits Thoracic Assessment Thoracic Assessment: Exceptions to [facility] (R posterior rotation and lower ribs flared) Lumbar Assessment Lumbar Assessment: Within Functional Limits Postural Control Postural Control: Deficits on evaluation Righting Reactions: Delayed ankle, hip and step strategies; intermittent posterior and R lateral LOB in sitting and standing  Balance Berg Balance Test Sit to Stand: Able to stand without using hands and stabilize independently Standing Unsupported: Able to stand safely 2 minutes Sitting with Back Unsupported but Feet Supported on Floor or Stool: Able to sit safely and securely 2 minutes Stand to Sit: Sits safely with minimal use of hands Transfers: Able to transfer safely, minor use of hands Standing Unsupported with Eyes Closed: Able to stand 10 seconds safely Standing Ubsupported with Feet Together: Able to place feet together independently and stand for 1 minute with supervision From Standing, Reach Forward with Outstretched Arm: Can reach confidently >25 cm (10") From Standing Position, Pick up Object from Floor: Able to pick up shoe safely and  easily From Standing Position, Turn to Look Behind Over each Shoulder: Looks behind one side only/other side shows less weight shift Turn 360 Degrees: Able to turn 360 degrees safely but slowly Standing Unsupported, Alternately Place Feet on Step/Stool: Able to stand independently and complete 8 steps >20 seconds Standing Unsupported, One Foot in Front: Able to take small step independently and hold 30 seconds Standing on  One Leg: Tries to lift leg/unable to hold 3 seconds but remains standing independently Total Score: 46 Extremity/Trunk Assessment RUE Assessment RUE Assessment: Within Functional Limits (grossly 4/5) LUE Assessment LUE Assessment: Within Functional Limits (grossly 4/5)  See FIM for current functional status  Leonette Monarch 04/14/2013, 2:53 PM

## 2013-04-14 NOTE — Progress Notes (Signed)
Patient ID: Meghan Yu, female   DOB: [birth year], 79 y.o.   MRN: [id number] Subjective/Complaints: No headache or dizziness Oriented to person place and day/date A 12 point review of systems has been performed and if not noted above is otherwise negative.   Objective: Vital Signs: Blood pressure 135/52, pulse 80, temperature 97.9 F (36.6 C), temperature source Oral, resp. rate 17, height 5\' 1"  (1.549 m), weight 55 kg (121 lb 4.1 oz), SpO2 96.00%. No results found. No results found for this basename: WBC, HGB, HCT, PLT,  in the last 72 hours No results found for this basename: NA, K, CL, CO, GLUCOSE, BUN, CREATININE, CALCIUM,  in the last 72 hours CBG (last 3)   Recent Labs  04/13/13 1210 04/13/13 1619 04/13/13 2022  GLUCAP 141* 129* 160*    Wt Readings from Last 3 Encounters:  04/02/13 55 kg (121 lb 4.1 oz)  03/26/13 60.782 kg (134 lb)  03/26/13 60.782 kg (134 lb)       Physical Exam:  Constitutional: She is oriented to person, place, and time. She appears well-developed and well-nourished.  HENT:  Scalp incision clean and dry with staples and sutures in place.  Eyes: Pupils are equal, round, and reactive to light.  Neck: Normal range of motion.  Cardiovascular: Normal rate and regular rhythm.  Pulmonary/Chest: Effort normal and breath sounds normal. No respiratory distress.  Abdominal: Soft. Bowel sounds are normal. She exhibits no distension. There is no tenderness.  Musculoskeletal: She exhibits edema (right knee).  Neurological: She is alert and oriented to person, place, and time.  Flat affect, looks straight ahead but able to turn to left with cues. Denies dizziness. No nystagmus noted. Mild right facial weakness. No dysarthria. Voice is hoarse. Delayed output with measured movements. Able to follow basic commands. Oriented to self, place, situation, age/DOB. Decreased awareness of deficits. RUE 4/5. LUE 4+/5. RLE is 4/5. LLE is 4+/5. S. Decreased FMC of right arm  and leg.  Skin: Skin is warm and dry. bruising along left neck as well as bilateral UE's.  motor strength 4 minus in the right deltoid, biceps, triceps, grip  4 minus the right hip flexors knee extensor 4 minus in ankle dorsiflexor plantar flexor  5/5 in the left deltoid, biceps, triceps, grip, hip flexor, knee extensors, ankle dorsiflexor plantar flexor  Sensation reduced to light touch in the right upper and right lower extremity normal in the left upper and left lower extremity  Psych:  No emotional lability  Assessment/Plan: 1. Functional deficits secondary to right frontal meningioma s/p resection which require 3+ hours per day of interdisciplinary therapy in a comprehensive inpatient rehab setting. Physiatrist is providing close team supervision and 24 hour management of active medical problems listed below. Physiatrist and rehab team continue to assess barriers to discharge/monitor patient progress toward functional and medical goals. Team conference today please see physician documentation under team conference tab, met with team face-to-face to discuss problems,progress, and goals. Formulized individual treatment plan based on medical history, underlying problem and comorbidities.  FIM: FIM - Bathing Bathing Steps Patient Completed: Chest;Right Arm;Left Arm;Abdomen;Front perineal area;Buttocks;Right upper leg;Left upper leg;Right lower leg (including foot);Left lower leg (including foot) Bathing: 5: Supervision: Safety issues/verbal cues  FIM - Upper Body Dressing/Undressing Upper body dressing/undressing steps patient completed: Thread/unthread left sleeve of pullover shirt/dress;Thread/unthread right sleeve of pullover shirt/dresss;Pull shirt over trunk;Put head through opening of pull over shirt/dress Upper body dressing/undressing: 5: Supervision: Safety issues/verbal cues FIM - Lower Body Dressing/Undressing Lower body dressing/undressing  steps patient completed: Thread/unthread  left pants leg;Pull pants up/down;Pull underwear up/down;Thread/unthread right pants leg;Don/Doff right sock;Don/Doff left sock;Thread/unthread right underwear leg;Thread/unthread left underwear leg;Don/Doff right shoe;Don/Doff left shoe;Fasten/unfasten right shoe;Fasten/unfasten left shoe Lower body dressing/undressing: 5: Supervision: Safety issues/verbal cues  FIM - Toileting Toileting steps completed by patient: Adjust clothing prior to toileting;Performs perineal hygiene;Adjust clothing after toileting Toileting Assistive Devices: Grab bar or rail for support Toileting: 5: Supervision: Safety issues/verbal cues  FIM - Archivist Transfers Assistive Devices: Art gallery manager Transfers: 5-From toilet/BSC: Supervision (verbal cues/safety issues);5-To toilet/BSC: Supervision (verbal cues/safety issues)  FIM - Banker Devices: Therapist, occupational: 5: Supine > Sit: Supervision (verbal cues/safety issues);5: Sit > Supine: Supervision (verbal cues/safety issues);5: Bed > Chair or W/C: Supervision (verbal cues/safety issues);5: Chair or W/C > Bed: Supervision (verbal cues/safety issues)  FIM - Locomotion: Wheelchair Distance: 150 Locomotion: Wheelchair: 0: Activity did not occur FIM - Locomotion: Ambulation Locomotion: Ambulation Assistive Devices: Designer, industrial/product Ambulation/Gait Assistance: 5: Supervision Locomotion: Ambulation: 5: Travels 150 ft or more with supervision/safety issues  Comprehension Comprehension Mode: Auditory Comprehension: 6-Follows complex conversation/direction: With extra time/assistive device  Expression Expression Mode: Verbal Expression: 6-Expresses complex ideas: With extra time/assistive device  Social Interaction Social Interaction: 6-Interacts appropriately with others with medication or extra time (anti-anxiety, antidepressant).  Problem Solving Problem Solving: 4-Solves basic 75 - 89% of the  time/requires cueing 10 - 24% of the time  Memory Memory: 4-Recognizes or recalls 75 - 89% of the time/requires cueing 10 - 24% of the time Medical Problem List and Plan:  1. DVT Prophylaxis/Anticoagulation: Mechanical: Sequential compression devices, below knee Bilateral lower extremities  2. Pain Management: Headaches resolved. Use tylenol prn  3. Mood: Anxiety seems to be controlled except at night. Resumed home hs dose of xanax.  -monitor clinically for am sedation--none at present 4. Neuropsych: This patient is capable of making decisions on her own behalf.  5. Steroid induced hyperglycemia: Will monitor BS with AC/HS checks. Use SSI as needed for elevated BS.   -wean steroids reduce to 1mg  today and D/C on 1mg  qd and taper 6. ABLA: Will recheck on Monday.  7. Constipation: Resolved with miralax. Will back off on laxatives due to history of bowel urgency--use prn.  8. Hypothyroid: continue supplement 9.  UTI vs colonization- will treat secondary to immunocompromise from steroids  LOS (Days) 12 A FACE TO FACE EVALUATION WAS PERFORMED  Erick Colace 04/14/2013 7:52 AM

## 2013-04-14 NOTE — Patient Care Conference (Signed)
Inpatient RehabilitationTeam Conference and Plan of Care Update Date: 04/14/2013   Time: 10;30 AM    Patient Name: Meghan Yu      Medical Record Number: [id number]  Date of Birth: [birth year] Sex: Female         Room/Bed: 4037/4037-01 Payor Info: Payor: MEDICARE / Plan: MEDICARE PART A AND B / Product Type: *No Product type* /    Admitting Diagnosis: L FONTAL CRANI MENINGIOMA  Admit Date/Time:  04/02/2013 11:56 AM Admission Comments: No comment available   Primary Diagnosis:  Brain tumor Principal Problem: Brain tumor  Patient Active Problem List   Diagnosis Date Noted  . Hyperglycemia--steroid induced 04/05/2013  . Dehydration 04/05/2013  . E. coli UTI 04/05/2013  . Brain tumor 03/26/2013  . Hypothyroidism 03/26/2013  . Hyperlipidemia 03/26/2013    Expected Discharge Date: Expected Discharge Date: 04/15/13  Team Members Present: Physician leading conference: Dr. Claudette Laws Social Worker Present: Dossie Der, LCSW Nurse Present: Laural Roes, RN PT Present: [name], PT;Caroline Adriana Simas, PT;Other (comment) Clarisse Gouge Ripa-PT) OT Present: Other (comment);Leonette Monarch, OT Dorathy Daft Perkinson-OT) SLP Present: Fae Pippin, SLP Other (Discipline and Name): Charolette Child     Current Status/Progress Goal Weekly Team Focus  Medical   Sutures and staples removed, activity tolerance improving  family training  Family to come in today   Bowel/Bladder   continent of bladder, can have urgency, occasional incontinence at HS. LBM 04/11/13  remain continent of bladder      Swallow/Nutrition/ Hydration     na        ADL's   supervision overall  supervision  sequencing, initiation, family ed, d/c planning   Mobility   supervision overall, goals met  supervision overall  family education, D/C planning   Communication   Supervision   supervision   goals met; family educaiton complete   Safety/Cognition/ Behavioral Observations  Min assist  Min assist  goals met; family  education complete   Pain   Denies pain  free of pain  monitor for pain   Skin   Bruising to left hand, incision to right side and staples to left side of head  no skin break down  monitor skin q shift prn      *See Care Plan and progress notes for long and short-term goals.  Barriers to Discharge: see above    Possible Resolutions to Barriers:  see above    Discharge Planning/Teaching Needs:  Home with husband and daughter who will be providing 24 hour supervision-family education today      Team Discussion:  Family education today with husband and daughter.  No CBG's at home-staples removed-plans to wash her hair at home.  Met goals ready for discharge tomorrow  Revisions to Treatment Plan:  None   Continued Need for Acute Rehabilitation Level of Care: The patient requires daily medical management by a physician with specialized training in physical medicine and rehabilitation for the following conditions: Daily direction of a multidisciplinary physical rehabilitation program to ensure safe treatment while eliciting the highest outcome that is of practical value to the patient.: Yes Daily medical management of patient stability for increased activity during participation in an intensive rehabilitation regime.: Yes Daily analysis of laboratory values and/or radiology reports with any subsequent need for medication adjustment of medical intervention for : Neurological problems  Trevone Prestwood, Lemar Livings 04/14/2013, 12:31 PM

## 2013-04-14 NOTE — Progress Notes (Signed)
Occupational Therapy Session Note  Patient Details  Name: Meghan Yu MRN: [id number] Date of Birth: 1932/01/08  Today's Date: 04/14/2013 Time: 0950-1030 and 1400-1430 Time Calculation (min): 40 min and 30 min  Short Term Goals: Week 2:  OT Short Term Goal 1 (Week 2): STG = LTGs due to remaining LOS  Skilled Therapeutic Interventions/Progress Updates:    1) Pt seen for family education and ADL retraining.  Pt's daughter and husband present for session and asking appropriate questions.  Both family members report pleased at progress as they used to provided physical assistance.  Educated family members on supervision level and encouraged them to allow pt to attempt tasks prior to intervening with assistance.  Pt overall supervision with transfers and self-care tasks.  Pt completed grooming in standing this session with increased time.  2) Pt seen for 1:1 OT with focus on transfers and balance reactions.  Engaged in simulated walk-in shower transfer with focus on sidestepping over ledge.  Pt return demonstrated safe placement of RW during transfer in/out of walk-in shower.  9 hole peg test with Rt: 37 secs and Lt: 42 secs with pt reporting all RUE weakness and decreased FMC resolved.  Engaged in weight shifting and balance reactions on Biodex with focus on limits of stability, maze control, and random control with pt continuing to demonstrate difficulty with weight shifting forward to Rt > Lt.  Discussed functional activities where this weight shift is necessary with pt reporting understanding.  Ambulated back to room to toilet with supervision and pt returned to bed at end of session with all necessary items in reach.  Therapy Documentation Precautions:  Precautions Precautions: Fall Restrictions Weight Bearing Restrictions: No Pain: Pain Assessment Pain Assessment: No/denies pain ADL: ADL Grooming: Modified independent Where Assessed-Grooming: Sitting at sink Upper Body Bathing:  Supervision/safety Where Assessed-Upper Body Bathing: Shower Lower Body Bathing: Supervision/safety Where Assessed-Lower Body Bathing: Shower Upper Body Dressing: Supervision/safety;Setup Where Assessed-Upper Body Dressing: Chair Lower Body Dressing: Supervision/safety;Setup Where Assessed-Lower Body Dressing: Chair Toileting: Supervision/safety Where Assessed-Toileting: Teacher, adult education: Distant supervision Statistician Method: Proofreader: Acupuncturist: Close supervision Film/video editor Method: Designer, industrial/product: Information systems manager with back;Grab bars  See FIM for current functional status  Therapy/Group: Individual Therapy  Leonette Monarch 04/14/2013, 10:44 AM

## 2013-04-14 NOTE — Progress Notes (Signed)
Speech Language Pathology Daily Session Note & Discharge Summary   Patient Details  Name: Meghan Yu MRN: [id number] Date of Birth: [birth year]  Today's Date: 04/14/2013 Time: 7829-5621 Time Calculation (min): 30 min  Short Term Goals: Week 2: SLP Short Term Goal 1 (Week 2): The patient will demonstrate functional problem solving with basic and familiar tasks with min A multimodal cueing.  SLP Short Term Goal 2 (Week 2): The patient will utilize external memory aids to recall information with min A multimodal cueing.   SLP Short Term Goal 3 (Week 2): Patient will identify two physical deficits that may hinder safety, with [facility] A questions cues.   Skilled Therapeutic Interventions: Skilled treatment session focused on addressing patient and family education.  SLP verbally provided education regarding cognitive-linguistic goals that were addressed during stay.  SLP provided education regarding home recommendations for carryover of information such as routines, staying mentally and physically active as well as assist with complex tasks such as cooking, medication and financial management.  Patient recalled and initiated request for home medication box and SLP provided her with one.  Patient and family able to verbalize things that patient needs assist with.     FIM:  Comprehension Comprehension Mode: Auditory Comprehension: 5-Understands complex 90% of the time/Cues < 10% of the time Expression Expression Mode: Verbal Expression: 6-Expresses complex ideas: With extra time/assistive device Social Interaction Social Interaction: 6-Interacts appropriately with others with medication or extra time (anti-anxiety, antidepressant). Problem Solving Problem Solving: 4-Solves basic 75 - 89% of the time/requires cueing 10 - 24% of the time Memory Memory: 5-Recognizes or recalls 90% of the time/requires cueing < 10% of the time  Pain Pain Assessment Pain Assessment: No/denies pain  Therapy/Group:  Individual Therapy   Speech Language Pathology Discharge Summary  Patient Details  Name: [name] MRN: [id number] Date of Birth: 1932/08/03  Today's Date: 04/14/2013  Patient has met 4 of 4 long term goals.  Patient to discharge at overall Supervision;Min level.  Reasons goals not met: n/a   Clinical Impression/Discharge Summary: Patient met 4 out of 4 long term goals this reporting period due to gains in cognitive-linguistic abilities.  Patient required Mod-Max assist upon admission and is now requiring Supervision-Min assist cuing to safely complete basic and complex tasks.  Patient requires cues for initiation, recall, self-monitoring and correcting and family verbalized ability to provide this assist.  As a result, this patient requires no further skilled SLP services at this time; family in agreement.  Care Partner:  Caregiver Able to Provide Assistance: Yes  Type of Caregiver Assistance: Physical;Cognitive  Recommendation:  24 hour supervision/assistance  Rationale for SLP Follow Up:  (n/a)   Equipment: none   Reasons for discharge: Treatment goals met;Discharged from hospital   Patient/Family Agrees with Progress Made and Goals Achieved: Yes   See FIM for current functional status  Charlane Ferretti., CCC-SLP [phone]  Miranda Garber 04/14/2013, 12:29 PM

## 2013-04-14 NOTE — Social Work (Signed)
Meghan Chris, LCSW Social Worker Signed  Patient Care Conference Service date: 04/14/2013 12:31 PM  Inpatient RehabilitationTeam Conference and Plan of Care Update Date: 04/14/2013   Time: 10;30 AM     Patient Name: Meghan Yu       Medical Record Number: [id number]   Date of Birth: 1932/02/28 Sex: Female         Room/Bed: 4037/4037-01 Payor Info: Payor: MEDICARE / Plan: MEDICARE PART A AND B / Product Type: *No Product type* /   Admitting Diagnosis: L FONTAL CRANI MENINGIOMA   Admit Date/Time:  04/02/2013 11:56 AM Admission Comments: No comment available   Primary Diagnosis:  Brain tumor Principal Problem: Brain tumor    Patient Active Problem List     Diagnosis  Date Noted   .  Hyperglycemia--steroid induced  04/05/2013   .  Dehydration  04/05/2013   .  E. coli UTI  04/05/2013   .  Brain tumor  03/26/2013   .  Hypothyroidism  03/26/2013   .  Hyperlipidemia  03/26/2013     Expected Discharge Date: Expected Discharge Date: 04/15/13  Team Members Present: Physician leading conference: Dr. Claudette Laws Social Worker Present: Dossie Der, LCSW Nurse Present: Laural Roes, RN PT Present: [name], PT;Caroline Adriana Simas, PT;Other (comment) Clarisse Gouge Ripa-PT) OT Present: Other (comment);Leonette Monarch, OT Dorathy Daft Perkinson-OT) SLP Present: Fae Pippin, SLP Other (Discipline and Name): Charolette Child        Current Status/Progress  Goal  Weekly Team Focus   Medical     Sutures and staples removed, activity tolerance improving  family training  Family to come in today   Bowel/Bladder     continent of bladder, can have urgency, occasional incontinence at HS. LBM 04/11/13  remain continent of bladder     Swallow/Nutrition/ Hydration     na       ADL's     supervision overall  supervision  sequencing, initiation, family ed, d/c planning   Mobility     supervision overall, goals met  supervision overall  family education, D/C planning   Communication     Supervision    supervision   goals met; family educaiton complete   Safety/Cognition/ Behavioral Observations    Min assist  Min assist  goals met; family education complete   Pain     Denies pain  free of pain  monitor for pain   Skin     Bruising to left hand, incision to right side and staples to left side of head  no skin break down  monitor skin q shift prn     *See Care Plan and progress notes for long and short-term goals.    Barriers to Discharge:  see above      Possible Resolutions to Barriers:    see above      Discharge Planning/Teaching Needs:    Home with husband and daughter who will be providing 24 hour supervision-family education today      Team Discussion:    Family education today with husband and daughter.  No CBG's at home-staples removed-plans to wash her hair at home.  Met goals ready for discharge tomorrow   Revisions to Treatment Plan:    None    Continued Need for Acute Rehabilitation Level of Care: The patient requires daily medical management by a physician with specialized training in physical medicine and rehabilitation for the following conditions: Daily direction of a multidisciplinary physical rehabilitation program to ensure safe treatment while eliciting the highest outcome  that is of practical value to the patient.: Yes Daily medical management of patient stability for increased activity during participation in an intensive rehabilitation regime.: Yes Daily analysis of laboratory values and/or radiology reports with any subsequent need for medication adjustment of medical intervention for : Neurological problems  Meghan Yu 04/14/2013, 12:31 PM         Meghan Chris, LCSW Social Worker Signed  Patient Care Conference Service date: 04/07/2013 2:50 PM  Inpatient RehabilitationTeam Conference and Plan of Care Update Date: 04/07/2013   Time: 10;30 Am     Patient Name: Meghan Yu       Medical Record Number: [id number]   Date of Birth:  Aug 19, 1932 Sex: Female         Room/Bed: 4037/4037-01 Payor Info: Payor: MEDICARE / Plan: MEDICARE PART A AND B / Product Type: *No Product type* /   Admitting Diagnosis: L FONTAL CRANI MENINGIOMA   Admit Date/Time:  04/02/2013 11:56 AM Admission Comments: No comment available   Primary Diagnosis:  Brain tumor Principal Problem: Brain tumor    Patient Active Problem List     Diagnosis  Date Noted   .  Hyperglycemia--steroid induced  04/05/2013   .  Dehydration  04/05/2013   .  E. coli UTI  04/05/2013   .  Brain tumor  03/26/2013   .  Hypothyroidism  03/26/2013   .  Hyperlipidemia  03/26/2013     Expected Discharge Date: Expected Discharge Date: 04/15/13  Team Members Present: Physician leading conference: Dr. Claudette Laws Social Worker Present: Dossie Der, LCSW Nurse Present: Laural Roes, RN PT Present: [name], PT;Caroline Adriana Simas, PT;Other (comment) Clarisse Gouge Ripa-PT) OT Present: Other (comment);Leonette Monarch, Felipa Eth, OT (Kayla Perkinson-OT) SLP Present: Fae Pippin, SLP Other (Discipline and Name): Charolette Child        Current Status/Progress  Goal  Weekly Team Focus   Medical     Headache resolved, appetite improved, fine motor deficits right side.  Maximize independence  Neuromuscular reeducation on the right., Improved endurance   Bowel/Bladder     continent of bladder, can have urgency/occasional incontinence at night, keflex for uti  remain continent of bladder  timed toileting q2-3 prn   Swallow/Nutrition/ Hydration     na       ADL's     min assist bathing, supervision UB dressing, mod assist LB dressing, min assist transfers and mobility  supervision overall  initiation, sequencing, postural control, standing balance, right attention   Mobility     min-mod A with intermittent total  A needed for balance during freezing moments  supervision overall  initiation, postural control, balance, gait   Communication     supervision   supervision    increase initiation    Safety/Cognition/ Behavioral Observations    Mod assist  Min assist   increase awareness, self-monitoring and correcting   Pain     denies pain  free of pain  monitor for pain   Skin     Bruising to L hand. incision to Right side and staples to Left side of head  no skin breakdown  monitor skin q shift prn     *See Care Plan and progress notes for long and short-term goals.    Barriers to Discharge:  See above      Possible Resolutions to Barriers:    See above      Discharge Planning/Teaching Needs:    Home with daughter and ex-husband to assist-will have 24 hour care  Team Discussion:    Timed tolieting-working on cont Fine motor deficits, overall doing well.  Monitoring skin-fragile   Revisions to Treatment Plan:    None    Continued Need for Acute Rehabilitation Level of Care: The patient requires daily medical management by a physician with specialized training in physical medicine and rehabilitation for the following conditions: Daily direction of a multidisciplinary physical rehabilitation program to ensure safe treatment while eliciting the highest outcome that is of practical value to the patient.: Yes Daily medical management of patient stability for increased activity during participation in an intensive rehabilitation regime.: Yes Daily analysis of laboratory values and/or radiology reports with any subsequent need for medication adjustment of medical intervention for : Neurological problems  Walter Min, Lemar Livings 04/07/2013, 2:50 PM

## 2013-04-15 DIAGNOSIS — C719 Malignant neoplasm of brain, unspecified: Secondary | ICD-10-CM

## 2013-04-15 MED ORDER — ALPRAZOLAM 2 MG PO TABS: 2.0000 mg | ORAL_TABLET | Freq: Every day | ORAL | Status: DC

## 2013-04-15 MED ORDER — ADULT MULTIVITAMIN W/MINERALS CH: 1.0000 | ORAL_TABLET | Freq: Every day | ORAL | Status: DC

## 2013-04-15 MED ORDER — MAGNESIUM CHLORIDE 64 MG PO TBEC: 1.0000 | DELAYED_RELEASE_TABLET | Freq: Every day | ORAL | Status: DC

## 2013-04-15 MED ORDER — SIMVASTATIN 20 MG PO TABS: 20.0000 mg | ORAL_TABLET | Freq: Every evening | ORAL | Status: DC

## 2013-04-15 MED ORDER — LEVETIRACETAM 500 MG PO TABS: 500.0000 mg | ORAL_TABLET | Freq: Two times a day (BID) | ORAL | Status: DC

## 2013-04-15 MED ORDER — FISH OIL 1000 MG PO CAPS: 1000.0000 mg | ORAL_CAPSULE | Freq: Two times a day (BID) | ORAL | Status: DC

## 2013-04-15 MED ORDER — LEVOTHYROXINE SODIUM 75 MCG PO TABS: 75.0000 ug | ORAL_TABLET | Freq: Every day | ORAL | Status: AC

## 2013-04-15 MED ORDER — ESOMEPRAZOLE MAGNESIUM 40 MG PO CPDR: 40.0000 mg | DELAYED_RELEASE_CAPSULE | Freq: Every day | ORAL | Status: DC

## 2013-04-15 NOTE — Progress Notes (Signed)
Social Work Discharge Note Discharge Note  The overall goal for the admission was met for:   Discharge location: Yes-HOME WITH HUSBAND AND DAUGHTER-24 HOUR SUPERVISION  Length of Stay: Yes-13 DAYS  Discharge activity level: Yes-SUPERVISION LEVEL  Home/community participation: Yes  Services provided included: MD, RD, PT, OT, SLP, RN, TR, Pharmacy and SW  Financial Services: Medicare and Private Insurance: [employer] [place]  Follow-up services arranged: Home Health: [facility]-PT,RN, DME: [facility], [facility] and Patient/Family has no preference for HH/DME agencies  Comments (or additional information):FAMILY EDUCATION COMPLETED YESTERDAY AND ALL COMFORTABLE WITH PT'S CARE  Patient/Family verbalized understanding of follow-up arrangements: Yes  Individual responsible for coordination of the follow-up plan: BOB-HUSBAND & Shatoya-DAUGHTER  Confirmed correct DME delivered: Lucy Chris 04/15/2013    Lucy Chris

## 2013-04-15 NOTE — Progress Notes (Signed)
Patient ID: Meghan Yu, female   DOB: June 15, 1932, 79 y.o.   MRN: [id number] Subjective/Complaints: No headache or dizziness Oriented to person place and day/date A 12 point review of systems has been performed and if not noted above is otherwise negative.   Objective: Vital Signs: Blood pressure 120/72, pulse 78, temperature 98.1 F (36.7 C), temperature source Oral, resp. rate 17, height 5\' 1"  (1.549 m), weight 56.9 kg (125 lb 7.1 oz), SpO2 92.00%. No results found. No results found for this basename: WBC, HGB, HCT, PLT,  in the last 72 hours No results found for this basename: NA, K, CL, CO, GLUCOSE, BUN, CREATININE, CALCIUM,  in the last 72 hours CBG (last 3)   Recent Labs  04/14/13 1647 04/14/13 2046 04/15/13 0724  GLUCAP 122* 172* 126*    Wt Readings from Last 3 Encounters:  04/14/13 56.9 kg (125 lb 7.1 oz)  03/26/13 60.782 kg (134 lb)  03/26/13 60.782 kg (134 lb)       Physical Exam:  Constitutional: She is oriented to person, place, and time. She appears well-developed and well-nourished.  HENT:  Scalp incision clean and dry Eyes: Pupils are equal, round, and reactive to light.  Neck: Normal range of motion.  Cardiovascular: Normal rate and regular rhythm.  Pulmonary/Chest: Effort normal and breath sounds normal. No respiratory distress.  Abdominal: Soft. Bowel sounds are normal. She exhibits no distension. There is no tenderness.  Musculoskeletal: She exhibits edema (right knee).  Neurological: She is alert and oriented to person, place, and time.  Flat affect, looks straight ahead but able to turn to left with cues. Denies dizziness. No nystagmus noted. Mild right facial weakness. No dysarthria.  Delayed output with measured movements. Able to follow basic commands. Oriented to self, place, situation, age/DOB. Decreased awareness of deficits. RUE 4/5. LUE 4+/5. RLE is 4/5. LLE is 4+/5. S. Decreased FMC of right arm and leg.  Skin: Skin is warm and dry. bruising  along left neck as well as bilateral UE's.  motor strength 4 minus in the right deltoid, biceps, triceps, grip  4 minus the right hip flexors knee extensor 4 minus in ankle dorsiflexor plantar flexor  5/5 in the left deltoid, biceps, triceps, grip, hip flexor, knee extensors, ankle dorsiflexor plantar flexor  Sensation reduced to light touch in the right upper and right lower extremity normal in the left upper and left lower extremity  Psych:  No emotional lability  Assessment/Plan: 1. Functional deficits secondary to right frontal meningioma s/p resection which require 3+ hours per day of interdisciplinary therapy in a comprehensive inpatient rehab setting. Physiatrist is providing close team supervision and 24 hour management of active medical problems listed below.  Stable for D/C today F/u PCP in 1-2 weeks F/u PM&R 3 weeks See D/C summary See D/C instructions  FIM: FIM - Bathing Bathing Steps Patient Completed: Chest;Right Arm;Left Arm;Abdomen;Front perineal area;Buttocks;Right upper leg;Left upper leg;Right lower leg (including foot);Left lower leg (including foot) Bathing: 5: Supervision: Safety issues/verbal cues  FIM - Upper Body Dressing/Undressing Upper body dressing/undressing steps patient completed: Thread/unthread left sleeve of pullover shirt/dress;Thread/unthread right sleeve of pullover shirt/dresss;Pull shirt over trunk;Put head through opening of pull over shirt/dress Upper body dressing/undressing: 5: Set-up assist to: Obtain clothing/put away FIM - Lower Body Dressing/Undressing Lower body dressing/undressing steps patient completed: Thread/unthread left pants leg;Pull pants up/down;Pull underwear up/down;Thread/unthread right pants leg;Don/Doff right sock;Don/Doff left sock;Thread/unthread right underwear leg;Thread/unthread left underwear leg;Don/Doff right shoe;Don/Doff left shoe;Fasten/unfasten right shoe;Fasten/unfasten left shoe Lower body dressing/undressing: 5:  Set-up assist to: Obtain clothing  FIM - Toileting Toileting steps completed by patient: Adjust clothing prior to toileting;Performs perineal hygiene;Adjust clothing after toileting Toileting Assistive Devices: Grab bar or rail for support Toileting: 5: Supervision: Safety issues/verbal cues  FIM - Diplomatic Services operational officer Devices: Best boy Transfers: 5-To toilet/BSC: Supervision (verbal cues/safety issues)  FIM - Banker Devices: Therapist, occupational: 5: Supine > Sit: Supervision (verbal cues/safety issues);5: Sit > Supine: Supervision (verbal cues/safety issues);5: Bed > Chair or W/C: Supervision (verbal cues/safety issues);5: Chair or W/C > Bed: Supervision (verbal cues/safety issues)  FIM - Locomotion: Wheelchair Distance: 150 Locomotion: Wheelchair: 0: Activity did not occur FIM - Locomotion: Ambulation Locomotion: Ambulation Assistive Devices: Designer, industrial/product Ambulation/Gait Assistance: 5: Supervision Locomotion: Ambulation: 5: Travels 150 ft or more with supervision/safety issues  Comprehension Comprehension Mode: Auditory Comprehension: 5-Follows basic conversation/direction: With no assist  Expression Expression Mode: Verbal Expression: 6-Expresses complex ideas: With extra time/assistive device  Social Interaction Social Interaction: 6-Interacts appropriately with others with medication or extra time (anti-anxiety, antidepressant).  Problem Solving Problem Solving: 5-Solves complex 90% of the time/cues < 10% of the time  Memory Memory: 6-More than reasonable amt of time Medical Problem List and Plan:  1. DVT Prophylaxis/Anticoagulation: Mechanical: Sequential compression devices, below knee Bilateral lower extremities  2. Pain Management: Headaches resolved. Use tylenol prn  3. Mood: Anxiety seems to be controlled except at night. Resumed home hs dose of xanax.  -monitor clinically for  am sedation--none at present 4. Neuropsych: This patient is capable of making decisions on her own behalf.  5. Steroid induced hyperglycemia: Will monitor BS with AC/HS checks. Use SSI as needed for elevated BS.   -wean steroids reduce to 1mg  today and D/C on 1mg  qd and taper 6. ABLA: Will recheck on Monday.  7. Constipation: Resolved with miralax. Will back off on laxatives due to history of bowel urgency--use prn.  8. Hypothyroid: continue supplement 9.  UTI vs colonization- will treat secondary to immunocompromise from steroids  LOS (Days) 13 A FACE TO FACE EVALUATION WAS PERFORMED  Corley Maffeo E 04/15/2013 9:06 AM

## 2013-04-20 NOTE — Discharge Summary (Signed)
Physician Discharge Summary  Patient ID: Meghan Yu MRN: [id number] DOB/AGE: Jul 27, 1932 79 y.o.  Admit date: 04/02/2013 Discharge date: 04/15/13  Discharge Diagnoses:  Principal Problem:   Brain tumor Active Problems:   Hyperglycemia--steroid induced   Dehydration   E. coli UTI   Discharged Condition: Good  Significant Diagnostic Studies: N/A   LABS:   BMET    Component Value Date/Time   NA 135 04/07/2013 0630   K 4.1 04/07/2013 0630   CL 101 04/07/2013 0630   CO2 29 04/07/2013 0630   GLUCOSE 132* 04/07/2013 0630   BUN 19 04/07/2013 0630   CREATININE 0.58 04/07/2013 0630   CALCIUM 8.7 04/07/2013 0630   GFRNONAA 85* 04/07/2013 0630   GFRAA >90 04/07/2013 0630   CBC    Component Value Date/Time   WBC 8.9 04/05/2013 0531   RBC 3.62* 04/05/2013 0531   HGB 10.7* 04/05/2013 0531   HCT 31.1* 04/05/2013 0531   PLT 193 04/05/2013 0531   MCV 85.9 04/05/2013 0531   MCH 29.6 04/05/2013 0531   MCHC 34.4 04/05/2013 0531   RDW 12.8 04/05/2013 0531   LYMPHSABS 1.6 04/05/2013 0531   MONOABS 0.4 04/05/2013 0531   EOSABS 0.0 04/05/2013 0531   BASOSABS 0.0 04/05/2013 0531     CBG:  Recent Labs Lab 04/14/13 0747 04/14/13 1210 04/14/13 1647 04/14/13 2046 04/15/13 0724  GLUCAP 122* 100* 122* 172* 126*    Brief HPI:   Meghan Yu is a 79 y.o. RH-female with history of hypothyroid, OA; admitted on 03/26/13 with lethargy, two week history of progressive right sided weakness with right facial droop, difficulty walking, lethargy and apraxia. MRI brain revealed large left hemispheric frontal and posterior frontal extra-axial mass likely meningioma with significant vasogenic edema. She was started on IV decadron and underwent she underwent L-frontal craniotomy with gross total resection of tumor on 03/30/13 by Dr. Danielle Dess. Post op with improvement in mentation as well RUE movement. Patient continued to be limited by right sided weakness, fatigue and well as delayed processing with mild right  inattention. Patient admitted to CIR per therapy team recommendations.    Hospital Course: Meghan Yu was admitted to rehab 04/02/2013 for inpatient therapies to consist of PT, ST and OT at least three hours five days a week. Past admission physiatrist, therapy team and rehab RN have worked together to provide customized collaborative inpatient rehab. Blood sugars were monitored on AC/HS basis due to steroids and elevated BS treated with SSI.  Headaches have resolved and she is to continue with steroid taper past discharge. ABLA has slowly improved. Renal insufficiency has resolved with encouragement of po fluids. Urine culture done admission revealed E. Coli UTI and she was treated with one week course of keflex. Her anxiety was controlled with resumption of low dose xanax at bedtime.  She has been seizure free during her stay. Crani incision has healed well without signs or symptoms of infection and sutures/staples were removed on 06/23. She has made good progress during her stay and is at supervision level overall.    Rehab course: During patient's stay in rehab weekly team conferences were held to monitor patient's progress, set goals and discuss barriers to discharge. Speech therapy-addressing cognitive-linguistic goals. She progressed from requiring mod- to max assist (at admission) to supervision to min assist for cues to safely complete basic and complex tasks. She  requires cues for initiation, recall, self-monitoring and correcting. Family education was done with daughter regarding setting routines, staying mentally and physically active as well  as assist with complex tasks such as cooking, medication and financial management.    She has had improved activity tolerance, improved balance, postural control, ability to compensate for deficits, functional use of RIGHT upper and RIGHT lower extremity and improved awareness.Occupational therapy has focused on neuromuscular reeducation of RUE as well as  ADL tasks and endurance. She is at supervision level for bathing and dressing tasks.    Physical therapy has focused on bed mobility, transfers, postural control, ataxia, balance reactions as well as motor control with improvement in timing and sequencing. Patient is modified independent for transfers with increased time. She is able to ambulate 150 feet with RW and navigate 12 stairs with supervision. BERG balance score at 46/56 and daughter/ Ex-husband educated on providing supervision for safety. She will continue to have HHPT by [facility] past discharge.  Disposition: 01-Home or Self Care  Diet: Diabetic  Special Instructions: No lifting, driving, or strenuous exercise for till cleared by MD   Future Appointments Provider Department Dept Phone   04/27/2013 11:45 AM [facility] [phone]   04/27/2013 12:00 PM Erick Colace, MD [facility] [phone]       Medication List    STOP taking these medications       dexamethasone 2 MG tablet  Commonly known as:  DECADRON     HYDROcodone-acetaminophen 5-325 MG per tablet  Commonly known as:  NORCO/VICODIN     insulin aspart 100 UNIT/ML injection  Commonly known as:  novoLOG     ondansetron 4 MG tablet  Commonly known as:  ZOFRAN     polyethylene glycol packet  Commonly known as:  MIRALAX / GLYCOLAX      TAKE these medications       alprazolam 2 MG tablet  Commonly known as:  XANAX  Take 1 tablet (2 mg total) by mouth at bedtime.     calcium carbonate 600 MG Tabs  Commonly known as:  OS-CAL  Take 600 mg by mouth 2 (two) times daily with a meal.     esomeprazole 40 MG capsule  Commonly known as:  NEXIUM  Take 1 capsule (40 mg total) by mouth daily before breakfast.     Fish Oil 1000 MG Caps  Take 1-2 capsules (1,000-2,000 mg total) by mouth 2 (two) times daily. Takes 2000 mg in the morning and 1000 mg in the evening      levETIRAcetam 500 MG tablet  Commonly known as:  KEPPRA  Take 1 tablet (500 mg total) by mouth 2 (two) times daily.     levothyroxine 75 MCG tablet  Commonly known as:  SYNTHROID, LEVOTHROID  Take 1 tablet (75 mcg total) by mouth daily before breakfast.     magnesium chloride 64 MG Tbec  Commonly known as:  SLOW-MAG  Take 1 tablet (64 mg total) by mouth daily.     multivitamin with minerals Tabs  Take 1 tablet by mouth daily.     senna-docusate 8.6-50 MG per tablet  Commonly known as:  Senokot-S  Take 1 tablet by mouth at bedtime.     simvastatin 20 MG tablet  Commonly known as:  ZOCOR  Take 1 tablet (20 mg total) by mouth every evening.     Vitamin D 2000 UNITS Caps  Take 1 capsule by mouth daily.           Follow-up Information   Follow up with Erick Colace, MD. (Be there at  11;30 for noon appointment)    Contact information:   [address] [place] [address] [phone]       Follow up with Stefani Dama, MD. Call today. (for post op follow up. )    Contact information:   [address] [place] [address] [phone]       Follow up with Georgann Housekeeper, MD On 04/21/2013. (aPPT: 11:30 AM)    Contact information:   [address] [place] [address] [phone]       Signed: Jacquelynn Cree 04/20/2013, 8:57 AM

## 2013-04-22 ENCOUNTER — Emergency Department (HOSPITAL_COMMUNITY): Payer: Medicare Other

## 2013-04-22 ENCOUNTER — Inpatient Hospital Stay (HOSPITAL_COMMUNITY)
Admission: EM | Admit: 2013-04-22 | Discharge: 2013-04-26 | DRG: 872 | Disposition: A | Discharge status: Home Health | Payer: Medicare Other | Attending: Internal Medicine | Admitting: Internal Medicine

## 2013-04-22 ENCOUNTER — Encounter (HOSPITAL_COMMUNITY): Payer: Self-pay | Admitting: *Deleted

## 2013-04-22 DIAGNOSIS — R5381 Other malaise: Secondary | ICD-10-CM | POA: Diagnosis present

## 2013-04-22 DIAGNOSIS — F411 Generalized anxiety disorder: Secondary | ICD-10-CM | POA: Diagnosis present

## 2013-04-22 DIAGNOSIS — E860 Dehydration: Secondary | ICD-10-CM | POA: Diagnosis present

## 2013-04-22 DIAGNOSIS — D496 Neoplasm of unspecified behavior of brain: Secondary | ICD-10-CM | POA: Diagnosis present

## 2013-04-22 DIAGNOSIS — I471 Supraventricular tachycardia: Secondary | ICD-10-CM

## 2013-04-22 DIAGNOSIS — T814XXA Infection following a procedure, initial encounter: Secondary | ICD-10-CM

## 2013-04-22 DIAGNOSIS — I498 Other specified cardiac arrhythmias: Secondary | ICD-10-CM | POA: Diagnosis present

## 2013-04-22 DIAGNOSIS — E039 Hypothyroidism, unspecified: Secondary | ICD-10-CM | POA: Diagnosis present

## 2013-04-22 DIAGNOSIS — E876 Hypokalemia: Secondary | ICD-10-CM | POA: Diagnosis present

## 2013-04-22 DIAGNOSIS — B9620 Unspecified Escherichia coli [E. coli] as the cause of diseases classified elsewhere: Secondary | ICD-10-CM | POA: Diagnosis present

## 2013-04-22 DIAGNOSIS — Z79899 Other long term (current) drug therapy: Secondary | ICD-10-CM

## 2013-04-22 DIAGNOSIS — J309 Allergic rhinitis, unspecified: Secondary | ICD-10-CM | POA: Diagnosis not present

## 2013-04-22 DIAGNOSIS — E785 Hyperlipidemia, unspecified: Secondary | ICD-10-CM | POA: Diagnosis present

## 2013-04-22 DIAGNOSIS — A419 Sepsis, unspecified organism: Secondary | ICD-10-CM | POA: Diagnosis present

## 2013-04-22 DIAGNOSIS — N390 Urinary tract infection, site not specified: Secondary | ICD-10-CM | POA: Diagnosis present

## 2013-04-22 DIAGNOSIS — A498 Other bacterial infections of unspecified site: Secondary | ICD-10-CM | POA: Diagnosis present

## 2013-04-22 DIAGNOSIS — R531 Weakness: Secondary | ICD-10-CM

## 2013-04-22 LAB — CBC WITH DIFFERENTIAL/PLATELET
Basophils Absolute: 0 10*3/uL (ref 0.0–0.1)
Basophils Relative: 0 % (ref 0–1)
Eosinophils Absolute: 0 K/uL (ref 0.0–0.7)
Eosinophils Relative: 0 % (ref 0–5)
HCT: 37.3 % (ref 36.0–46.0)
Hemoglobin: 12.6 g/dL (ref 12.0–15.0)
Lymphocytes Relative: 11 % — ABNORMAL LOW (ref 12–46)
Lymphs Abs: 1 10*3/uL (ref 0.7–4.0)
MCH: 29.2 pg (ref 26.0–34.0)
MCHC: 33.8 g/dL (ref 30.0–36.0)
MCV: 86.5 fL (ref 78.0–100.0)
Monocytes Absolute: 0.5 K/uL (ref 0.1–1.0)
Monocytes Relative: 6 % (ref 3–12)
Neutro Abs: 7 10*3/uL (ref 1.7–7.7)
Neutrophils Relative %: 82 % — ABNORMAL HIGH (ref 43–77)
Platelets: 194 10*3/uL (ref 150–400)
RBC: 4.31 MIL/uL (ref 3.87–5.11)
RDW: 13 % (ref 11.5–15.5)
WBC: 8.5 10*3/uL (ref 4.0–10.5)

## 2013-04-22 LAB — URINALYSIS, ROUTINE W REFLEX MICROSCOPIC
Bilirubin Urine: NEGATIVE
Glucose, UA: NEGATIVE mg/dL
Ketones, ur: NEGATIVE mg/dL
Nitrite: POSITIVE — AB
Protein, ur: NEGATIVE mg/dL
Specific Gravity, Urine: 1.008 (ref 1.005–1.030)
Urobilinogen, UA: 0.2 mg/dL (ref 0.0–1.0)
pH: 6.5 (ref 5.0–8.0)

## 2013-04-22 LAB — URINE MICROSCOPIC-ADD ON

## 2013-04-22 LAB — COMPREHENSIVE METABOLIC PANEL
ALT: 24 U/L (ref 0–35)
ALT: 20 U/L (ref 0–35)
AST: 21 U/L (ref 0–37)
AST: 25 U/L (ref 0–37)
Albumin: 2.4 g/dL — ABNORMAL LOW (ref 3.5–5.2)
Alkaline Phosphatase: 74 U/L (ref 39–117)
CO2: 25 mEq/L (ref 19–32)
CO2: 22 mEq/L (ref 19–32)
Calcium: 8.7 mg/dL (ref 8.4–10.5)
Calcium: 7.5 mg/dL — ABNORMAL LOW (ref 8.4–10.5)
Chloride: 98 mEq/L (ref 96–112)
Chloride: 110 mEq/L (ref 96–112)
Creatinine, Ser: 0.57 mg/dL (ref 0.50–1.10)
GFR calc Af Amer: >90 mL/min (ref >90)
GFR calc non Af Amer: 84 mL/min — ABNORMAL LOW (ref >90)
Glucose, Bld: 133 mg/dL — ABNORMAL HIGH (ref 70–99)
Potassium: 3.4 mEq/L — ABNORMAL LOW (ref 3.5–5.1)
Sodium: 135 mEq/L (ref 135–145)
Sodium: 141 mEq/L (ref 135–145)
Total Bilirubin: 0.4 mg/dL (ref 0.3–1.2)

## 2013-04-22 LAB — COMPREHENSIVE METABOLIC PANEL WITH GFR
Albumin: 3.2 g/dL — ABNORMAL LOW (ref 3.5–5.2)
BUN: 12 mg/dL (ref 6–23)
Creatinine, Ser: 0.6 mg/dL (ref 0.50–1.10)
Total Protein: 6.6 g/dL (ref 6.0–8.3)

## 2013-04-22 LAB — PROTIME-INR: Prothrombin Time: 12.5 seconds (ref 11.6–15.2)

## 2013-04-22 LAB — CG4 I-STAT (LACTIC ACID): Lactic Acid, Venous: 3.03 mmol/L — ABNORMAL HIGH (ref 0.5–2.2)

## 2013-04-22 LAB — PROCALCITONIN: Procalcitonin: <0.1 ng/mL

## 2013-04-22 MED ORDER — POTASSIUM CHLORIDE 10 MEQ/100ML IV SOLN
10.0000 meq | INTRAVENOUS | Status: AC
  Administered 2013-04-22 (×2): 10 meq via INTRAVENOUS
  Filled 2013-04-22 (×3): qty 100

## 2013-04-22 MED ORDER — SODIUM CHLORIDE 0.9 % IV SOLN
1000.0000 mL | INTRAVENOUS | Status: DC
  Administered 2013-04-22: 1000 mL via INTRAVENOUS

## 2013-04-22 MED ORDER — SODIUM CHLORIDE 0.9 % IV SOLN
1000.0000 mL | Freq: Once | INTRAVENOUS | Status: AC
  Administered 2013-04-22: 1000 mL via INTRAVENOUS

## 2013-04-22 MED ORDER — ACETAMINOPHEN 325 MG PO TABS
650.0000 mg | ORAL_TABLET | Freq: Once | ORAL | Status: AC
  Administered 2013-04-22: 650 mg via ORAL
  Filled 2013-04-22: qty 2

## 2013-04-22 MED ORDER — PIPERACILLIN-TAZOBACTAM 3.375 G IVPB 30 MIN: 3.3750 g | Freq: Once | INTRAVENOUS | Status: DC

## 2013-04-22 MED ORDER — PIPERACILLIN-TAZOBACTAM 3.375 G IVPB
3.3750 g | Freq: Three times a day (TID) | INTRAVENOUS | Status: DC
  Administered 2013-04-22 – 2013-04-24 (×6): 3.375 g via INTRAVENOUS
  Filled 2013-04-22 (×8): qty 50

## 2013-04-22 MED ORDER — SODIUM CHLORIDE 0.9 % IV BOLUS (SEPSIS)
1000.0000 mL | Freq: Once | INTRAVENOUS | Status: AC
  Administered 2013-04-22: 1000 mL via INTRAVENOUS

## 2013-04-22 MED ORDER — VANCOMYCIN HCL IN DEXTROSE 1-5 GM/200ML-% IV SOLN
1000.0000 mg | Freq: Once | INTRAVENOUS | Status: AC
  Administered 2013-04-22: 1000 mg via INTRAVENOUS
  Filled 2013-04-22 (×2): qty 200

## 2013-04-22 MED ORDER — PIPERACILLIN-TAZOBACTAM 3.375 G IVPB
3.3750 g | Freq: Three times a day (TID) | INTRAVENOUS | Status: DC
  Filled 2013-04-22 (×2): qty 50

## 2013-04-22 MED ORDER — PIPERACILLIN-TAZOBACTAM 3.375 G IVPB 30 MIN
3.3750 g | Freq: Once | INTRAVENOUS | Status: AC
  Administered 2013-04-22: 3.375 g via INTRAVENOUS
  Filled 2013-04-22: qty 50

## 2013-04-22 MED ORDER — DEXTROSE 5 % IV SOLN: 2.0000 g | Freq: Once | INTRAVENOUS | Status: DC

## 2013-04-22 MED ORDER — VANCOMYCIN HCL IN DEXTROSE 750-5 MG/150ML-% IV SOLN
750.0000 mg | Freq: Two times a day (BID) | INTRAVENOUS | Status: DC
  Filled 2013-04-22: qty 150

## 2013-04-22 MED ORDER — SODIUM CHLORIDE 0.9 % IV SOLN
INTRAVENOUS | Status: DC
  Administered 2013-04-22 – 2013-04-23 (×2): via INTRAVENOUS

## 2013-04-22 MED ORDER — VANCOMYCIN HCL IN DEXTROSE 750-5 MG/150ML-% IV SOLN
750.0000 mg | Freq: Two times a day (BID) | INTRAVENOUS | Status: DC
  Administered 2013-04-22 – 2013-04-23 (×3): 750 mg via INTRAVENOUS
  Filled 2013-04-22 (×5): qty 150

## 2013-04-22 MED ORDER — DEXTROSE 5 % IV SOLN: 2.0000 g | INTRAVENOUS | Status: DC

## 2013-04-22 NOTE — ED Notes (Signed)
MD at bedside. 

## 2013-04-22 NOTE — ED Provider Notes (Signed)
I saw and evaluated the patient, reviewed the resident's note and I agree with the findings and plan.  Pt is s/p brain surgery by Dr. Danielle Dess on 6/29, has been at home for the past several days, noticed slight increase in weakness about 2 days ago, last night into this AM shows worse weakness, more rapid HR and malaise.  She was on the toilet and was too difficult to stand up.  No N/V, no coughing.  Denies HA, stiff neck, rash.  Family contacted Dr. Verlee Rossetti office, asked them to contact PCP office, so they called Dr. Venita Sheffield office and he directed them to come to the ED for further evaluation.  Will get labs, CXR, UA, blood cultures, start empiric abx as well as 2 L of IVF's for presumed sepsis and likely will need admission.    Lactic acid is 3, BP is normal, baseline mentation.  Surgical incision appears normal.  Lungs clear.  abd soft.  HR is rapid, regular.  No obv murmurs.  No meningismus.  No rash.   Impression: Post operative fever Sinus tachycardia Weakness    Gavin Pound. Julieanne Hadsall, MD 04/22/13 1044

## 2013-04-22 NOTE — ED Provider Notes (Signed)
History    CSN: [id number] Arrival date & time 04/22/13  0828  First MD Initiated Contact with Patient 04/22/13 [phone]     Chief Complaint  Patient presents with  . Weakness   (Consider location/radiation/quality/duration/timing/severity/associated sxs/prior Treatment) Patient is a 79 y.o. female presenting with weakness. The history is provided by the patient and a relative.  Weakness Associated symptoms include weakness. Associated symptoms comments: Ms. Abramo is a 79yo female, h/o brain tumor removal on 6/26, presenting today with confusion, change in personality and fever per her family.  For the past 3-4 days, the patient has been seeing spiders on the couch when there are none, not smiling as often, and not engaging in conversation to her normal extent.  Husband states her temperature yesterday was 101.2 and here in the ED it is 102.8.  Patient was also very weak today as she went to the toilet nd was unable to get up, stand or walk afterwards.  Patient denies any headache, fever, chills, cough, or changes in her urine or bowel.  Daughter states her urine may have smelled strong a few days ago as well.  ROS for the patient is otherwise negative.  .   Past Medical History  Diagnosis Date  . Hypothyroidism   . Hyperlipidemia   . Anxiety disorder   . Arthritis of knee, right     Needs replacement/Dr. Lajoyce Corners  . Colon polyps     colonoscopy by Dr. Laural Benes  . Degenerative arthritis of hand     bilateral   Past Surgical History  Procedure Laterality Date  . Knee arthroscopy    . Cholecystectomy    . Abdominal hysterectomy    . Craniotomy Left 03/30/2013    Procedure: LEFT FRONTAL CRANIOTOMY FOR RESECTION OF MENINGIOMA;  Surgeon: Barnett Abu, MD;  Location: [facility];  Service: Neurosurgery;  Laterality: Left;  Left Frontal Craniotomy for tumor excision   Family History  Problem Relation Age of Onset  . Stroke Mother   . Cirrhosis Father   . Emphysema Sister    History   Substance Use Topics  . Smoking status: Never Smoker   . Smokeless tobacco: Not on file  . Alcohol Use: No   OB History   Grav Para Term Preterm Abortions TAB SAB Ect Mult Living                 Review of Systems  Neurological: Positive for weakness.  10 Systems reviewed and are negative for acute change except as noted in the HPI.   Allergies  Review of patient's allergies indicates no known allergies.  Home Medications   Current Outpatient Rx  Name  Route  Sig  Dispense  Refill  . ALPRAZolam (XANAX) 2 MG tablet   Oral   Take 1 tablet (2 mg total) by mouth at bedtime.   30 tablet   0   . calcium carbonate (OS-CAL) 600 MG TABS   Oral   Take 600 mg by mouth 2 (two) times daily with a meal.         . Cholecalciferol (VITAMIN D) 2000 UNITS CAPS   Oral   Take 1 capsule by mouth daily.         Marland Kitchen esomeprazole (NEXIUM) 40 MG capsule   Oral   Take 1 capsule (40 mg total) by mouth daily before breakfast.   30 capsule   1   . levETIRAcetam (KEPPRA) 500 MG tablet   Oral   Take 1 tablet (500  mg total) by mouth 2 (two) times daily.   60 tablet   1   . levothyroxine (SYNTHROID, LEVOTHROID) 75 MCG tablet   Oral   Take 1 tablet (75 mcg total) by mouth daily before breakfast.   30 tablet   1   . magnesium chloride (SLOW-MAG) 64 MG TBEC   Oral   Take 1 tablet (64 mg total) by mouth daily.   60 tablet   1   . Multiple Vitamin (MULTIVITAMIN WITH MINERALS) TABS   Oral   Take 1 tablet by mouth daily.         . Omega-3 Fatty Acids (FISH OIL) 1000 MG CAPS   Oral   Take 1-2 capsules (1,000-2,000 mg total) by mouth 2 (two) times daily. Takes 2000 mg in the morning and 1000 mg in the evening   60 capsule   0   . senna-docusate (SENOKOT-S) 8.6-50 MG per tablet   Oral   Take 1 tablet by mouth at bedtime.   2 tablet   0   . simvastatin (ZOCOR) 20 MG tablet   Oral   Take 1 tablet (20 mg total) by mouth every evening.   30 tablet   1    BP 141/69  Pulse 152   Temp(Src) 102.8 F (39.3 C) (Rectal)  Resp 32  Ht 5' 1.02" (1.55 m)  Wt 125 lb 7.1 oz (56.9 kg)  BMI 23.68 kg/m2  SpO2 94% Physical Exam  Nursing note and vitals reviewed. Constitutional: She is oriented to person, place, and time. She appears well-developed and well-nourished. No distress.  HENT:  Head: Normocephalic and atraumatic.  Eyes: Conjunctivae and EOM are normal. Pupils are equal, round, and reactive to light. No scleral icterus.  Neck: Normal range of motion. Neck supple. No JVD present. No tracheal deviation present. No thyromegaly present.  Cardiovascular: Regular rhythm and normal heart sounds.  Exam reveals no gallop and no friction rub.   No murmur heard. Borderline tachycardia  Pulmonary/Chest: No respiratory distress. She has no wheezes. She has rales. She exhibits no tenderness.  Abdominal: Soft. Bowel sounds are normal. She exhibits no distension and no mass. There is no tenderness. There is no rebound and no guarding.  Musculoskeletal: Normal range of motion. She exhibits no edema and no tenderness.  Lymphadenopathy:    She has no cervical adenopathy.  Neurological: She is alert and oriented to person, place, and time. No cranial nerve deficit.  Skin: Skin is warm and dry. No rash noted. She is not diaphoretic. No erythema. No pallor.    ED Course  Procedures (including critical care time) Labs Reviewed  CBC WITH DIFFERENTIAL - Abnormal; Notable for the following:    Neutrophils Relative % 82 (*)    Lymphocytes Relative 11 (*)    All other components within normal limits  CG4 I-STAT (LACTIC ACID) - Abnormal; Notable for the following:    Lactic Acid, Venous 3.03 (*)    All other components within normal limits  CULTURE, BLOOD (ROUTINE X 2)  CULTURE, BLOOD (ROUTINE X 2)  URINE CULTURE  PROCALCITONIN  URINALYSIS, ROUTINE W REFLEX MICROSCOPIC  COMPREHENSIVE METABOLIC PANEL   Dg Chest Port 1 View  04/22/2013   *RADIOLOGY REPORT*  Clinical Data: Weakness,  fever, sepsis  PORTABLE CHEST - 1 VIEW  Comparison: 03/26/2013  Findings: Cardiomediastinal silhouette is stable.  No acute infiltrate or pleural effusion.  No pulmonary edema.  Bony thorax is stable.  Mild left basilar atelectasis.  IMPRESSION: No acute infiltrate  or pulmonary edema.  Mild left basilar atelectasis.   Original Report Authenticated By: Natasha Mead, M.D.   No diagnosis found.   Date: 04/22/2013  Rate: 150  Rhythm: sinus tachycardia  QRS Axis: right  Intervals: normal  ST/T Wave abnormalities: normal  Conduction Disutrbances:none  Narrative Interpretation:   Old EKG Reviewed: unchanged    MDM  Patient currently meets SIRS criteria with HR and temp.  Thus far, she does not have a source, but a UA is still pending at the time of this note.  She was treated for a post-op infection of unknown source with vanc and zosyn and the patient will be admitted to the hospital for continued care.  Patient and family is amendable with this plan.  Tomasita Crumble, MD 04/22/13 4098  Tomasita Crumble, MD 04/22/13 1025

## 2013-04-22 NOTE — ED Notes (Signed)
Blood cultures drawn.

## 2013-04-22 NOTE — Progress Notes (Signed)
Family found assisting pt to [facility] without staff. Pt and family educated on pt being a high fall risk, and recommended that family utilize staff to assist with patient ADLs at this time. Family voices understanding, however, states that they would like to d/c bed alarm while they are with pt, and verbalize intent to assist pt with [facility] and mobility when they are on the unit. Family aware of risks of injury if pt falls. Will continue to monitor. Pt wearing red socks/yellow arm band, Fall Prevention Plan signed and at bedside.

## 2013-04-22 NOTE — Progress Notes (Addendum)
ANTIBIOTIC CONSULT NOTE - INITIAL  Pharmacy Consult for vancomycin and zosyn Indication: rule out sepsis  No Known Allergies  Patient Measurements: Height: 5' 1.02" (155 cm) (04/02/13) Weight: 125 lb 7.1 oz (56.9 kg) (04/13/13) IBW/kg (Calculated) : 47.86   Vital Signs: Temp: 102.8 F (39.3 C) (07/03 0858) Temp src: Rectal (07/03 0858) BP: 141/69 mmHg (07/03 0838) Pulse Rate: 152 (07/03 0838) Intake/Output from previous day:   Intake/Output from this shift:    Labs: No results found for this basename: WBC, HGB, PLT, LABCREA, CREATININE,  in the last 72 hours Estimated Creatinine Clearance: 43.1 ml/min (by C-G formula based on Cr of 0.58). No results found for this basename: VANCOTROUGH, Leodis Binet, VANCORANDOM, GENTTROUGH, GENTPEAK, GENTRANDOM, TOBRATROUGH, TOBRAPEAK, TOBRARND, AMIKACINPEAK, AMIKACINTROU, AMIKACIN,  in the last 72 hours   Microbiology: Recent Results (from the past 720 hour(s))  MRSA PCR SCREENING     Status: None   Collection Time    03/26/13 11:16 PM      Result Value Range Status   MRSA by PCR NEGATIVE  NEGATIVE Final   Comment:            The GeneXpert MRSA Assay (FDA     approved for NASAL specimens     only), is one component of a     comprehensive MRSA colonization     surveillance program. It is not     intended to diagnose MRSA     infection nor to guide or     monitor treatment for     MRSA infections.  URINE CULTURE     Status: None   Collection Time    04/02/13  1:36 PM      Result Value Range Status   Specimen Description URINE, CLEAN CATCH   Final   Special Requests NONE   Final   Culture  Setup Time 04/02/2013 18:55   Final   Colony Count >=100,000 COLONIES/ML   Final   Culture ESCHERICHIA COLI   Final   Report Status 04/04/2013 FINAL   Final   Organism ID, Bacteria ESCHERICHIA COLI   Final    Medical History: Past Medical History  Diagnosis Date  . Hypothyroidism   . Hyperlipidemia   . Anxiety disorder   . Arthritis of  knee, right     Needs replacement/Dr. Lajoyce Corners  . Colon polyps     colonoscopy by Dr. Laural Benes  . Degenerative arthritis of hand     bilateral    Medications:  No abx pta  Assessment: Meghan Yu is a 79 yo WF recently discharged from [facility] after a craniotomy for a brain tumor.  She is admitted today for weakness, confusion, fever and change in personality.  Pharmacy consulted to dose vancomycin and zosyn for sepsis.  Her temp is 102.8, her lactic acid is elevated at 3.03.  Her creat is 0.6 with creat cl 68 ml/min.  CXR OK with only mild left basilar atelectasis.  Her WBC is wnl at 8.5.  Her wt is 56.9 kg. When she was on rehab she was treated for a E coli UTI from 04/04/13 culture with 1 week of keflex.  7/3 BC x 2 7/3 urine cx  Goal of Therapy:  Vancomycin trough level 15-20 mcg/ml  Plan:  1. Zosyn 3 375 gm IV x 1 dose over 30 minutes in the ED, then zosyn 3.375 gm IV q8h infuse each dose over 4 hours 2. Vancomycin 1000 mg IV x 1 dose in the ED. Then vancomycin  750 mg IV q12h 3. F/u renal function, culture data, WBC, fever, clinical course 4. Steady-state vancomycin trough if vanc to be continued > 7 days Herby Abraham, Pharm.D. [phone] 04/22/2013 10:38 AM    Addendum: Antibiotics were changed to Rocephin alone for urinary sepsis. Spoke with Dr. Joseph Art and would like to change back to Vancomycin and Zosyn until blood culture results due to recent brain surgery. Will reorder dosing as above and reorder consults.   Meghan Yu, PharmD, BCPS Clinical Pharmacist [phone] 04/22/2013, 1:37 PM

## 2013-04-22 NOTE — Progress Notes (Signed)
Family requesting home medications for pt. Pt is currently resting comfortably in bed with no current cx. MD notified.

## 2013-04-22 NOTE — Progress Notes (Signed)
Spoke with MD regarding pt family requests for home medications and PT eval/pt walking in hall. MD aware, no new orders. Will resume home medications and activity when medically improved. Family informed and aware. Will continue to monitor.

## 2013-04-22 NOTE — Progress Notes (Signed)
Utilization  Review completed.    Kyli Sorter,RN,BSN   Care Management.    

## 2013-04-22 NOTE — Progress Notes (Signed)
MD text paged r/t order clarification, will continue to monitor.

## 2013-04-22 NOTE — ED Notes (Signed)
Per pt family- pt has had recent brain surgery approx 1 month ago. Pt woke today with weakness, fever approx 101.2 at home. Family states that she walked to the restroom last night and was unable to get off the toilet.

## 2013-04-22 NOTE — H&P (Signed)
[facility] History and Physical  Meghan Yu JYN:[id number] DOB: [birth year] DOA: 04/22/2013  Referring physician:  PCP: Georgann Housekeeper, MD  Specialists:   Chief Complaint: Increasing weakness, malaise  HPI: Meghan Yu is a 79 y.o.  WF PMHx s/p brain surgery by Dr. Danielle Dess on 6/29, hypothyroidism, HLD, anxiety, UTI has been at home for the past several days, noticed slight increase in weakness about 2 days ago, last night into this AM shows worse weakness, more rapid HR and malaise. She was on the toilet and was too difficult to stand up. No N/V, no coughing. Denies HA, stiff neck, rash. Family contacted Dr. Verlee Rossetti office, asked them to contact PCP office, so they called Dr. Venita Sheffield office and he directed them to come to the ED for further evaluation. Will get labs, CXR, UA, blood cultures, start empiric abx as well as 2 L of IVF's for presumed sepsis and likely will need admission.  Lactic acid is 3, BP is normal, baseline mentation. Surgical incision appears normal. Lungs clear. abd soft. HR is rapid, regular. No obv murmurs. No meningismus. No rash. Upon exam patient is lethargic but arousable and answers questions appropriately   Review of Systems: The patient denies anorexia, fever, weight loss,, vision loss, decreased hearing, hoarseness, chest pain, syncope, dyspnea on exertion, peripheral edema, balance deficits, hemoptysis, abdominal pain, melena, hematochezia, severe indigestion/heartburn, hematuria, incontinence, genital sores, muscle weakness, suspicious skin lesions, transient blindness, difficulty walking, depression, unusual weight change, abnormal bleeding, enlarged lymph nodes, angioedema, and breast masses.    Past Medical History  Diagnosis Date  . Hypothyroidism   . Hyperlipidemia   . Anxiety disorder   . Arthritis of knee, right     Needs replacement/Dr. Lajoyce Corners  . Colon polyps     colonoscopy by Dr. Laural Benes  . Degenerative arthritis of hand     bilateral    Past Surgical History  Procedure Laterality Date  . Knee arthroscopy    . Cholecystectomy    . Abdominal hysterectomy    . Craniotomy Left 03/30/2013    Procedure: LEFT FRONTAL CRANIOTOMY FOR RESECTION OF MENINGIOMA;  Surgeon: Barnett Abu, MD;  Location: [facility];  Service: Neurosurgery;  Laterality: Left;  Left Frontal Craniotomy for tumor excision   Social History:  reports that she has never smoked. She does not have any smokeless tobacco history on file. She reports that she does not drink alcohol or use illicit drugs. No Known Allergies  Family History  Problem Relation Age of Onset  . Stroke Mother   . Cirrhosis Father   . Emphysema Sister      Prior to Admission medications   Medication Sig Start Date End Date Taking? Authorizing Provider  ALPRAZolam Prudy Feeler) 2 MG tablet Take 1 tablet (2 mg total) by mouth at bedtime. 04/15/13  Yes Daniel J Angiulli, PA-C  calcium carbonate (OS-CAL) 600 MG TABS Take 600 mg by mouth 2 (two) times daily with a meal.   Yes Historical Provider, MD  Cholecalciferol (VITAMIN D) 2000 UNITS CAPS Take 1 capsule by mouth daily.   Yes Historical Provider, MD  esomeprazole (NEXIUM) 40 MG capsule Take 1 capsule (40 mg total) by mouth daily before breakfast. 04/15/13  Yes Daniel J Angiulli, PA-C  levETIRAcetam (KEPPRA) 500 MG tablet Take 1 tablet (500 mg total) by mouth 2 (two) times daily. 04/15/13  Yes Daniel J Angiulli, PA-C  levothyroxine (SYNTHROID, LEVOTHROID) 75 MCG tablet Take 1 tablet (75 mcg total) by mouth daily before breakfast. 04/15/13  Yes Mcarthur Rossetti  Angiulli, PA-C  magnesium chloride (SLOW-MAG) 64 MG TBEC Take 1 tablet (64 mg total) by mouth daily. 04/15/13  Yes Daniel J Angiulli, PA-C  Multiple Vitamin (MULTIVITAMIN WITH MINERALS) TABS Take 1 tablet by mouth daily. 04/15/13  Yes Daniel J Angiulli, PA-C  Omega-3 Fatty Acids (FISH OIL) 1000 MG CAPS Take 1-2 capsules (1,000-2,000 mg total) by mouth 2 (two) times daily. Takes 2000 mg in the morning  and 1000 mg in the evening 04/15/13  Yes Daniel J Angiulli, PA-C  senna-docusate (SENOKOT-S) 8.6-50 MG per tablet Take 1 tablet by mouth at bedtime. 04/01/13  Yes Georgann Housekeeper, MD  simvastatin (ZOCOR) 20 MG tablet Take 1 tablet (20 mg total) by mouth every evening. 04/15/13  Yes Charlton Amor, PA-C   Physical Exam: Filed Vitals:   04/22/13 1342 04/22/13 1500 04/22/13 2142 04/23/13 0501  BP: 113/55 118/54 154/72 145/58  Pulse: 111 112 132 125  Temp: 97.8 F (36.6 C) 98.9 F (37.2 C) 99 F (37.2 C) 99 F (37.2 C)  TempSrc:   Oral Oral  Resp: 17 18 19 20   Height:   5\' 1"  (1.549 m)   Weight:   63.5 kg (139 lb 15.9 oz)   SpO2: 98% 99% 98% 93%     General:  Lethargic, but arousable answers questions appropriately,NAD  Eyes:  Pupils equal round reactive to light and accommodation  Cardiovascular: Regular rhythm, tachycardic negative murmurs rubs or gallops  Respiratory: Clear to auscultation bilaterally  Abdomen: Soft, nontender, nondistended, plus bowel sounds  Skin:   Musculoskeletal: Plus left CVA tenderness   Labs on Admission:  Basic Metabolic Panel:  Recent Labs Lab 04/22/13 0919 04/22/13 1714  NA 135 141  K 3.4* 4.1  CL 98 110  CO2 25 22  GLUCOSE 133* 155*  BUN 12 8  CREATININE 0.60 0.57  CALCIUM 8.7 7.5*   Liver Function Tests:  Recent Labs Lab 04/22/13 0919 04/22/13 1714  AST 21 25  ALT 24 20  ALKPHOS 74 60  BILITOT 0.4 0.3  PROT 6.6 5.4*  ALBUMIN 3.2* 2.4*   No results found for this basename: LIPASE, AMYLASE,  in the last 168 hours No results found for this basename: AMMONIA,  in the last 168 hours CBC:  Recent Labs Lab 04/22/13 0919  WBC 8.5  NEUTROABS 7.0  HGB 12.6  HCT 37.3  MCV 86.5  PLT 194   Cardiac Enzymes: No results found for this basename: CKTOTAL, CKMB, CKMBINDEX, TROPONINI,  in the last 168 hours  BNP (last 3 results) No results found for this basename: PROBNP,  in the last 8760 hours CBG: No results found for  this basename: GLUCAP,  in the last 168 hours  Radiological Exams on Admission: Dg Chest Port 1 View  04/22/2013   *RADIOLOGY REPORT*  Clinical Data: Weakness, fever, sepsis  PORTABLE CHEST - 1 VIEW  Comparison: 03/26/2013  Findings: Cardiomediastinal silhouette is stable.  No acute infiltrate or pleural effusion.  No pulmonary edema.  Bony thorax is stable.  Mild left basilar atelectasis.  IMPRESSION: No acute infiltrate or pulmonary edema.  Mild left basilar atelectasis.   Original Report Authenticated By: Natasha Mead, M.D.    EKG: Compared EKG 03/26/2013 supraventricular tachycardia RAD. No significant change. Assessment/Plan Principal Problem:   Sepsis Active Problems:   Brain tumor   Hypothyroidism   Hyperlipidemia   Dehydration   E. coli UTI   SVT (supraventricular tachycardia)   1. Sepsis; it appears to have urosepsis, however blood cultures have not  returned therefore will keep her on vancomycin plus Zosyn until all cultures return. This is secondary to patient having just started to recover from her brain surgery. Will contact Dr.Husain [phone] who is her PCP and will round on her in the a.m. 2. SVT; even after hydration patient continues to have a tachyarrhythmia will start patient on low-dose beta blocker 2.5 mg IV metoprolol  3. Hypothyroidism; will obtain thyroid levels  4. HLD; will obtain an add-on labs 5. Dehydration; this is secondary to her septic state we'll continue to hydrate aggressively.   Disposition Plan: Per Dr. Donette Larry  Time spent: [address] Roselind Messier [facility] Pager [phone] If 7PM-7AM, please contact night-coverage [email or web address] Password [email or web address] 04/23/2013, 7:20 AM

## 2013-04-22 NOTE — ED Notes (Signed)
Family states that pt was seeing spiders on the couch that were not there. Family states that she "just didn't seem herself". Pt also states that her urine has been dark and had a strong odor.

## 2013-04-22 NOTE — ED Notes (Signed)
Lactic acid results shown to Dr. Ghim 

## 2013-04-23 ENCOUNTER — Other Ambulatory Visit: Payer: Self-pay

## 2013-04-23 DIAGNOSIS — I471 Supraventricular tachycardia: Secondary | ICD-10-CM | POA: Diagnosis present

## 2013-04-23 DIAGNOSIS — E860 Dehydration: Secondary | ICD-10-CM

## 2013-04-23 DIAGNOSIS — E876 Hypokalemia: Secondary | ICD-10-CM | POA: Diagnosis not present

## 2013-04-23 DIAGNOSIS — A419 Sepsis, unspecified organism: Secondary | ICD-10-CM | POA: Diagnosis present

## 2013-04-23 DIAGNOSIS — N390 Urinary tract infection, site not specified: Secondary | ICD-10-CM

## 2013-04-23 DIAGNOSIS — A498 Other bacterial infections of unspecified site: Secondary | ICD-10-CM

## 2013-04-23 DIAGNOSIS — I498 Other specified cardiac arrhythmias: Secondary | ICD-10-CM

## 2013-04-23 LAB — COMPREHENSIVE METABOLIC PANEL
ALT: 19 U/L (ref 0–35)
AST: 16 U/L (ref 0–37)
Alkaline Phosphatase: 66 U/L (ref 39–117)
CO2: 23 mEq/L (ref 19–32)
Calcium: 7.7 mg/dL — ABNORMAL LOW (ref 8.4–10.5)
Chloride: 105 mEq/L (ref 96–112)
GFR calc non Af Amer: 89 mL/min — ABNORMAL LOW (ref >90)
Glucose, Bld: 129 mg/dL — ABNORMAL HIGH (ref 70–99)
Potassium: 3.1 mEq/L — ABNORMAL LOW (ref 3.5–5.1)
Sodium: 139 mEq/L (ref 135–145)
Total Bilirubin: 0.5 mg/dL (ref 0.3–1.2)

## 2013-04-23 LAB — CBC
Hemoglobin: 10.5 g/dL — ABNORMAL LOW (ref 12.0–15.0)
Platelets: 169 10*3/uL (ref 150–400)
RBC: 3.48 MIL/uL — ABNORMAL LOW (ref 3.87–5.11)
WBC: 5.6 10*3/uL (ref 4.0–10.5)

## 2013-04-23 LAB — URINE CULTURE: Colony Count: >=100000

## 2013-04-23 LAB — CBC WITH DIFFERENTIAL/PLATELET
Basophils Absolute: 0 10*3/uL (ref 0.0–0.1)
Basophils Relative: 0 % (ref 0–1)
Eosinophils Relative: 2 % (ref 0–5)
HCT: 33.6 % — ABNORMAL LOW (ref 36.0–46.0)
MCHC: 32.7 g/dL (ref 30.0–36.0)
MCV: 88 fL (ref 78.0–100.0)
Monocytes Absolute: 0.4 10*3/uL (ref 0.1–1.0)
RDW: 13.5 % (ref 11.5–15.5)

## 2013-04-23 LAB — BASIC METABOLIC PANEL
Calcium: 8.3 mg/dL — ABNORMAL LOW (ref 8.4–10.5)
Creatinine, Ser: 0.56 mg/dL (ref 0.50–1.10)
GFR calc Af Amer: >90 mL/min (ref >90)
GFR calc non Af Amer: 86 mL/min — ABNORMAL LOW (ref >90)

## 2013-04-23 LAB — CORTISOL: Cortisol, Plasma: 10.7 ug/dL

## 2013-04-23 LAB — MAGNESIUM: Magnesium: 1.5 mg/dL (ref 1.5–2.5)

## 2013-04-23 LAB — HEPATIC FUNCTION PANEL
Bilirubin, Direct: 0.1 mg/dL (ref 0.0–0.3)
Total Bilirubin: 0.5 mg/dL (ref 0.3–1.2)

## 2013-04-23 LAB — TSH: TSH: 0.742 u[IU]/mL (ref 0.350–4.500)

## 2013-04-23 MED ORDER — ALPRAZOLAM 0.5 MG PO TABS
1.0000 mg | ORAL_TABLET | Freq: Every evening | ORAL | Status: DC | PRN
  Administered 2013-04-24 – 2013-04-25 (×2): 1 mg via ORAL
  Filled 2013-04-23 (×5): qty 1

## 2013-04-23 MED ORDER — DEXTROMETHORPHAN POLISTIREX 30 MG/5ML PO LQCR
5.0000 mL | Freq: Four times a day (QID) | ORAL | Status: DC | PRN
  Administered 2013-04-23 – 2013-04-26 (×7): 30 mg via ORAL
  Filled 2013-04-23 (×8): qty 5

## 2013-04-23 MED ORDER — METOPROLOL TARTRATE 25 MG PO TABS
25.0000 mg | ORAL_TABLET | Freq: Two times a day (BID) | ORAL | Status: DC
  Administered 2013-04-23 – 2013-04-24 (×2): 25 mg via ORAL
  Filled 2013-04-23 (×3): qty 1

## 2013-04-23 MED ORDER — POTASSIUM CHLORIDE IN NACL 20-0.45 MEQ/L-% IV SOLN
INTRAVENOUS | Status: DC
  Administered 2013-04-23: 11:00:00 via INTRAVENOUS
  Filled 2013-04-23 (×3): qty 1000

## 2013-04-23 MED ORDER — METOPROLOL TARTRATE 1 MG/ML IV SOLN
2.5000 mg | INTRAVENOUS | Status: AC
  Administered 2013-04-23: 2.5 mg via INTRAVENOUS
  Filled 2013-04-23: qty 5

## 2013-04-23 MED ORDER — METOPROLOL TARTRATE 1 MG/ML IV SOLN
2.5000 mg | Freq: Every morning | INTRAVENOUS | Status: DC
  Administered 2013-04-23: 2.5 mg via INTRAVENOUS
  Filled 2013-04-23: qty 5

## 2013-04-23 MED ORDER — POTASSIUM CHLORIDE CRYS ER 20 MEQ PO TBCR
20.0000 meq | EXTENDED_RELEASE_TABLET | Freq: Every day | ORAL | Status: DC
  Administered 2013-04-23 – 2013-04-26 (×4): 20 meq via ORAL
  Filled 2013-04-23 (×4): qty 1

## 2013-04-23 MED ORDER — ALPRAZOLAM 0.5 MG PO TABS
1.0000 mg | ORAL_TABLET | Freq: Two times a day (BID) | ORAL | Status: DC | PRN
  Administered 2013-04-23: 1 mg via ORAL
  Filled 2013-04-23: qty 2

## 2013-04-23 MED ORDER — POTASSIUM CHLORIDE 10 MEQ/100ML IV SOLN
10.0000 meq | INTRAVENOUS | Status: AC
  Administered 2013-04-23 (×2): 10 meq via INTRAVENOUS
  Filled 2013-04-23 (×2): qty 100

## 2013-04-23 MED ORDER — DEXTROMETHORPHAN POLISTIREX 30 MG/5ML PO LQCR
5.0000 mL | ORAL | Status: DC | PRN
  Filled 2013-04-23: qty 5

## 2013-04-23 NOTE — Progress Notes (Signed)
04/23/2013 patient heart rate was 127 to 131 at 1645 and  she was asymptomatic. Dr Kevan Ny was called and orders were given for one time does of lopressor 2.5. Patient heart rate increase to 133 to 149. She was given lopressor 2.5  At 1738 and eventually heart rate decrease to 126 to 127. [facility] RN.

## 2013-04-23 NOTE — Progress Notes (Addendum)
Subjective: Mrs. Larouche is feeling much better today.  Heart rate down to around 110.  Appears to be in sinus rhythm but hard to tell by monitor.  We'll check EKG.  No headache.  Feels much more energetic today.  Objective: Weight change:   Intake/Output Summary (Last 24 hours) at 04/23/13 0846 Last data filed at 04/22/13 1853  Gross per 24 hour  Intake    320 ml  Output    250 ml  Net     70 ml   Filed Vitals:   04/22/13 1342 04/22/13 1500 04/22/13 2142 04/23/13 0501  BP: 113/55 118/54 154/72 145/58  Pulse: 111 112 132 125  Temp: 97.8 F (36.6 C) 98.9 F (37.2 C) 99 F (37.2 C) 99 F (37.2 C)  TempSrc:   Oral Oral  Resp: 17 18 19 20   Height:   5\' 1"  (1.549 m)   Weight:   63.5 kg (139 lb 15.9 oz)   SpO2: 98% 99% 98% 93%    General Appearance: Alert, cooperative, no distress, appears stated age HEENT: Scalp wound well healed from surgery last month Lungs: Rales in right base, otherwise clear Heart: Regular rhythm, S1 and S2 normal, no murmur, rub or gallop, moderately increased rate Abdomen: Soft, non-tender, bowel sounds active all four quadrants, no masses, no organomegaly Extremities: Extremities normal, atraumatic, no cyanosis or edema Neuro: Nonfocal exam, alert and conversive, moving all extremities well  Lab Results: Results for orders placed during the hospital encounter of 04/22/13 (from the past 48 hour(s))  CG4 I-STAT (LACTIC ACID)     Status: Abnormal   Collection Time    04/22/13  9:15 AM      Result Value Range   Lactic Acid, Venous 3.03 (*) 0.5 - 2.2 mmol/L  PROCALCITONIN     Status: None   Collection Time    04/22/13  9:19 AM      Result Value Range   Procalcitonin <0.10     Comment:            Interpretation:     PCT (Procalcitonin) <= 0.5 ng/mL:     Systemic infection (sepsis) is not likely.     Local bacterial infection is possible.     (NOTE)             ICU PCT Algorithm               Non ICU PCT Algorithm   ----------------------------     ------------------------------             PCT < 0.25 ng/mL                 PCT < 0.1 ng/mL         Stopping of antibiotics            Stopping of antibiotics           strongly encouraged.               strongly encouraged.        ----------------------------     ------------------------------           PCT level decrease by               PCT < 0.25 ng/mL           >= 80% from peak PCT           OR PCT 0.25 - 0.5 ng/mL          Stopping  of antibiotics                                                 encouraged.         Stopping of antibiotics               encouraged.        ----------------------------     ------------------------------           PCT level decrease by              PCT >= 0.25 ng/mL           < 80% from peak PCT            AND PCT >= 0.5 ng/mL            Continuing antibiotics                                                  encouraged.           Continuing antibiotics                encouraged.        ----------------------------     ------------------------------         PCT level increase compared          PCT > 0.5 ng/mL             with peak PCT AND              PCT >= 0.5 ng/mL             Escalation of antibiotics                                              strongly encouraged.          Escalation of antibiotics            strongly encouraged.  CBC WITH DIFFERENTIAL     Status: Abnormal   Collection Time    04/22/13  9:19 AM      Result Value Range   WBC 8.5  4.0 - 10.5 K/uL   RBC 4.31  3.87 - 5.11 MIL/uL   Hemoglobin 12.6  12.0 - 15.0 g/dL   HCT 62.1  30.8 - 65.7 %   MCV 86.5  78.0 - 100.0 fL   MCH 29.2  26.0 - 34.0 pg   MCHC 33.8  30.0 - 36.0 g/dL   RDW 84.6  96.2 - 95.2 %   Platelets 194  150 - 400 K/uL   Neutrophils Relative % 82 (*) 43 - 77 %   Neutro Abs 7.0  1.7 - 7.7 K/uL   Lymphocytes Relative 11 (*) 12 - 46 %   Lymphs Abs 1.0  0.7 - 4.0 K/uL   Monocytes Relative 6  3 - 12 %   Monocytes Absolute 0.5  0.1 -  1.0 K/uL   Eosinophils Relative 0  0 - 5 %   Eosinophils Absolute 0.0  0.0 - 0.7 K/uL   Basophils Relative 0  0 -  1 %   Basophils Absolute 0.0  0.0 - 0.1 K/uL  COMPREHENSIVE METABOLIC PANEL     Status: Abnormal   Collection Time    04/22/13  9:19 AM      Result Value Range   Sodium 135  135 - 145 mEq/L   Potassium 3.4 (*) 3.5 - 5.1 mEq/L   Chloride 98  96 - 112 mEq/L   CO2 25  19 - 32 mEq/L   Glucose, Bld 133 (*) 70 - 99 mg/dL   BUN 12  6 - 23 mg/dL   Creatinine, Ser 1.61  0.50 - 1.10 mg/dL   Calcium 8.7  8.4 - 09.6 mg/dL   Total Protein 6.6  6.0 - 8.3 g/dL   Albumin 3.2 (*) 3.5 - 5.2 g/dL   AST 21  0 - 37 U/L   ALT 24  0 - 35 U/L   Alkaline Phosphatase 74  39 - 117 U/L   Total Bilirubin 0.4  0.3 - 1.2 mg/dL   GFR calc non Af Amer 84 (*) >90 mL/min   GFR calc Af Amer >90  >90 mL/min   Comment:            The eGFR has been calculated     using the CKD EPI equation.     This calculation has not been     validated in all clinical     situations.     eGFR's persistently     <90 mL/min signify     possible Chronic Kidney Disease.  CULTURE, BLOOD (ROUTINE X 2)     Status: None   Collection Time    04/22/13  9:20 AM      Result Value Range   Specimen Description BLOOD RIGHT ANTECUBITAL     Special Requests BOTTLES DRAWN AEROBIC AND ANAEROBIC 10CC     Culture  Setup Time 04/22/2013 14:11     Culture       Value:        BLOOD CULTURE RECEIVED NO GROWTH TO DATE CULTURE WILL BE HELD FOR 5 DAYS BEFORE ISSUING A FINAL NEGATIVE REPORT   Report Status PENDING    CULTURE, BLOOD (ROUTINE X 2)     Status: None   Collection Time    04/22/13  9:25 AM      Result Value Range   Specimen Description BLOOD RIGHT ANTECUBITAL     Special Requests BOTTLES DRAWN AEROBIC AND ANAEROBIC 10CC     Culture  Setup Time 04/22/2013 14:10     Culture       Value:        BLOOD CULTURE RECEIVED NO GROWTH TO DATE CULTURE WILL BE HELD FOR 5 DAYS BEFORE ISSUING A FINAL NEGATIVE REPORT   Report Status  PENDING    URINALYSIS, ROUTINE W REFLEX MICROSCOPIC     Status: Abnormal   Collection Time    04/22/13 10:10 AM      Result Value Range   Color, Urine YELLOW  YELLOW   APPearance CLOUDY (*) CLEAR   Specific Gravity, Urine 1.008  1.005 - 1.030   pH 6.5  5.0 - 8.0   Glucose, UA NEGATIVE  NEGATIVE mg/dL   Hgb urine dipstick MODERATE (*) NEGATIVE   Bilirubin Urine NEGATIVE  NEGATIVE   Ketones, ur NEGATIVE  NEGATIVE mg/dL   Protein, ur NEGATIVE  NEGATIVE mg/dL   Urobilinogen, UA 0.2  0.0 - 1.0 mg/dL   Nitrite POSITIVE (*) NEGATIVE   Leukocytes, UA TRACE (*) NEGATIVE  URINE MICROSCOPIC-ADD ON     Status: Abnormal   Collection Time    04/22/13 10:10 AM      Result Value Range   Squamous Epithelial / LPF RARE  RARE   WBC, UA 0-2  <3 WBC/hpf   RBC / HPF 3-6  <3 RBC/hpf   Bacteria, UA MANY (*) RARE  CORTISOL     Status: None   Collection Time    04/22/13  4:55 PM      Result Value Range   Cortisol, Plasma 10.7     Comment: (NOTE)     AM:  4.3 - 22.4 ug/dL     PM:  3.1 - 16.1 ug/dL  COMPREHENSIVE METABOLIC PANEL     Status: Abnormal   Collection Time    04/22/13  5:14 PM      Result Value Range   Sodium 141  135 - 145 mEq/L   Potassium 4.1  3.5 - 5.1 mEq/L   Comment: DELTA CHECK NOTED     HEMOLYSIS AT THIS LEVEL MAY AFFECT RESULT   Chloride 110  96 - 112 mEq/L   Comment: DELTA CHECK NOTED   CO2 22  19 - 32 mEq/L   Glucose, Bld 155 (*) 70 - 99 mg/dL   BUN 8  6 - 23 mg/dL   Creatinine, Ser 0.96  0.50 - 1.10 mg/dL   Calcium 7.5 (*) 8.4 - 10.5 mg/dL   Total Protein 5.4 (*) 6.0 - 8.3 g/dL   Albumin 2.4 (*) 3.5 - 5.2 g/dL   AST 25  0 - 37 U/L   ALT 20  0 - 35 U/L   Alkaline Phosphatase 60  39 - 117 U/L   Total Bilirubin 0.3  0.3 - 1.2 mg/dL   GFR calc non Af Amer 86 (*) >90 mL/min   GFR calc Af Amer >90  >90 mL/min   Comment:            The eGFR has been calculated     using the CKD EPI equation.     This calculation has not been     validated in all clinical      situations.     eGFR's persistently     <90 mL/min signify     possible Chronic Kidney Disease.  PROTIME-INR     Status: None   Collection Time    04/22/13  5:14 PM      Result Value Range   Prothrombin Time 12.5  11.6 - 15.2 seconds   INR 0.95  0.00 - 1.49  LACTIC ACID, PLASMA     Status: Abnormal   Collection Time    04/22/13  5:15 PM      Result Value Range   Lactic Acid, Venous 3.1 (*) 0.5 - 2.2 mmol/L  CBC     Status: Abnormal   Collection Time    04/23/13  5:00 AM      Result Value Range   WBC 5.6  4.0 - 10.5 K/uL   RBC 3.48 (*) 3.87 - 5.11 MIL/uL   Hemoglobin 10.5 (*) 12.0 - 15.0 g/dL   HCT 04.5 (*) 40.9 - 81.1 %   MCV 87.6  78.0 - 100.0 fL   MCH 30.2  26.0 - 34.0 pg   MCHC 34.4  30.0 - 36.0 g/dL   RDW 91.4  78.2 - 95.6 %   Platelets 169  150 - 400 K/uL  COMPREHENSIVE METABOLIC PANEL     Status: Abnormal   Collection  Time    04/23/13  5:00 AM      Result Value Range   Sodium 139  135 - 145 mEq/L   Potassium 3.1 (*) 3.5 - 5.1 mEq/L   Chloride 105  96 - 112 mEq/L   CO2 23  19 - 32 mEq/L   Glucose, Bld 129 (*) 70 - 99 mg/dL   BUN 5 (*) 6 - 23 mg/dL   Creatinine, Ser 1.61  0.50 - 1.10 mg/dL   Calcium 7.7 (*) 8.4 - 10.5 mg/dL   Total Protein 5.5 (*) 6.0 - 8.3 g/dL   Albumin 2.3 (*) 3.5 - 5.2 g/dL   AST 16  0 - 37 U/L   ALT 19  0 - 35 U/L   Alkaline Phosphatase 66  39 - 117 U/L   Total Bilirubin 0.5  0.3 - 1.2 mg/dL   GFR calc non Af Amer 89 (*) >90 mL/min   GFR calc Af Amer >90  >90 mL/min   Comment:            The eGFR has been calculated     using the CKD EPI equation.     This calculation has not been     validated in all clinical     situations.     eGFR's persistently     <90 mL/min signify     possible Chronic Kidney Disease.    Studies/Results: Dg Chest Port 1 View  04/22/2013   *RADIOLOGY REPORT*  Clinical Data: Weakness, fever, sepsis  PORTABLE CHEST - 1 VIEW  Comparison: 03/26/2013  Findings: Cardiomediastinal silhouette is stable.  No acute  infiltrate or pleural effusion.  No pulmonary edema.  Bony thorax is stable.  Mild left basilar atelectasis.  IMPRESSION: No acute infiltrate or pulmonary edema.  Mild left basilar atelectasis.   Original Report Authenticated By: Natasha Mead, M.D.   Medications: Scheduled Meds: . metoprolol  2.5 mg Intravenous q morning - 10a  . piperacillin-tazobactam (ZOSYN)  IV  3.375 g Intravenous Q8H  . potassium chloride  10 mEq Intravenous Q1 Hr x 2  . vancomycin  750 mg Intravenous Q12H   Continuous Infusions: . sodium chloride 150 mL/hr at 04/23/13 0417   PRN Meds:.  Assessment/Plan: Principal Problem:   Sepsis Active Problems:   Brain tumor   Hypothyroidism   Hyperlipidemia   Dehydration   E. coli UTI   SVT (supraventricular tachycardia)  1. Sepsis - symptomatically better on IV antibiotics.  Followup on cultures.  Continue IV antibiotics today 2. SVT - appears to be in sinus tach currently.  We'll check EKG to confirm.  Likely infection related.  Also was dehydrated on admission.  Has been rehydrated now.  Rales in right base.  Reduce IV fluids to 60 an hour 3. Hypothyroidism - thyroid function pending 4. History of hyperlipidemia 5. Dehydration - moist mucous membranes with saliva.  Rehydration has been essentially completed on physical exam.  Reduce IV fluids 6. Hypokalemia - potassium 3.1 - replace IV and by mouth 7. Disposition - hopefully home in 48 hours  LOS: 1 day   Pearla Dubonnet, MD 04/23/2013, 8:46 AM

## 2013-04-23 NOTE — Progress Notes (Signed)
04/23/2013 patient heart rated at 1815 been running 116 to 117. [facility] RN.

## 2013-04-24 LAB — COMPREHENSIVE METABOLIC PANEL WITH GFR
ALT: 18 U/L (ref 0–35)
AST: 14 U/L (ref 0–37)
Albumin: 2.5 g/dL — ABNORMAL LOW (ref 3.5–5.2)
Alkaline Phosphatase: 65 U/L (ref 39–117)
BUN: 5 mg/dL — ABNORMAL LOW (ref 6–23)
CO2: 24 meq/L (ref 19–32)
Calcium: 8.5 mg/dL (ref 8.4–10.5)
Chloride: 101 meq/L (ref 96–112)
Creatinine, Ser: 0.57 mg/dL (ref 0.50–1.10)
GFR calc Af Amer: >90 mL/min
GFR calc non Af Amer: 86 mL/min — ABNORMAL LOW
Glucose, Bld: 132 mg/dL — ABNORMAL HIGH (ref 70–99)
Potassium: 3.7 meq/L (ref 3.5–5.1)
Sodium: 137 meq/L (ref 135–145)
Total Bilirubin: 0.6 mg/dL (ref 0.3–1.2)
Total Protein: 6.1 g/dL (ref 6.0–8.3)

## 2013-04-24 LAB — CBC WITH DIFFERENTIAL/PLATELET
Basophils Absolute: 0 K/uL (ref 0.0–0.1)
Basophils Relative: 0 % (ref 0–1)
Eosinophils Absolute: 0.2 K/uL (ref 0.0–0.7)
Eosinophils Relative: 3 % (ref 0–5)
HCT: 31.7 % — ABNORMAL LOW (ref 36.0–46.0)
Hemoglobin: 10.3 g/dL — ABNORMAL LOW (ref 12.0–15.0)
Lymphocytes Relative: 28 % (ref 12–46)
Lymphs Abs: 1.7 K/uL (ref 0.7–4.0)
MCH: 28.5 pg (ref 26.0–34.0)
MCHC: 32.5 g/dL (ref 30.0–36.0)
MCV: 87.8 fL (ref 78.0–100.0)
Monocytes Absolute: 0.4 K/uL (ref 0.1–1.0)
Monocytes Relative: 6 % (ref 3–12)
Neutro Abs: 3.7 K/uL (ref 1.7–7.7)
Neutrophils Relative %: 62 % (ref 43–77)
Platelets: 182 K/uL (ref 150–400)
RBC: 3.61 MIL/uL — ABNORMAL LOW (ref 3.87–5.11)
RDW: 13.3 % (ref 11.5–15.5)
WBC: 5.9 K/uL (ref 4.0–10.5)

## 2013-04-24 MED ORDER — CIPROFLOXACIN HCL 500 MG PO TABS
500.0000 mg | ORAL_TABLET | Freq: Two times a day (BID) | ORAL | Status: DC
  Administered 2013-04-24 – 2013-04-26 (×5): 500 mg via ORAL
  Filled 2013-04-24 (×7): qty 1

## 2013-04-24 MED ORDER — METOPROLOL TARTRATE 50 MG PO TABS
50.0000 mg | ORAL_TABLET | Freq: Two times a day (BID) | ORAL | Status: DC
  Administered 2013-04-24 – 2013-04-26 (×4): 50 mg via ORAL
  Filled 2013-04-24 (×5): qty 1

## 2013-04-24 MED ORDER — METOPROLOL TARTRATE 50 MG PO TABS
50.0000 mg | ORAL_TABLET | Freq: Two times a day (BID) | ORAL | Status: DC
  Filled 2013-04-24: qty 1

## 2013-04-24 NOTE — Evaluation (Signed)
Physical Therapy Evaluation Patient Details Name: Meghan Yu MRN: [id number] DOB: 02-10-1932 Today's Date: 04/24/2013 Time: 5621-3086 PT Time Calculation (min): 13 min  PT Assessment / Plan / Recommendation History of Present Illness  Pt adm with urosepsis. Pt just recently dc'd from CIR after removal of brain tumor last month.  Clinical Impression  Pt admitted with above. Pt currently with functional limitations due to the deficits listed below (see PT Problem List).  Pt will benefit from skilled PT to increase their independence and safety with mobility to allow discharge to the venue listed below.       PT Assessment  Patient needs continued PT services    Follow Up Recommendations  Home health PT    Does the patient have the potential to tolerate intense rehabilitation      Barriers to Discharge        Equipment Recommendations  None recommended by PT    Recommendations for Other Services     Frequency Min 3X/week    Precautions / Restrictions Precautions Precautions: Fall   Pertinent Vitals/Pain N/A      Mobility  Bed Mobility Supine to Sit: 6: Modified independent (Device/Increase time) Sitting - Scoot to Edge of Bed: 6: Modified independent (Device/Increase time) Sit to Supine: 6: Modified independent (Device/Increase time) Details for Bed Mobility Assistance: incr time Transfers Sit to Stand: 5: Supervision;With upper extremity assist;From bed Stand to Sit: 5: Supervision;With upper extremity assist;To bed Ambulation/Gait Ambulation/Gait Assistance: 5: Supervision Ambulation Distance (Feet): 300 Feet Assistive device: Rolling walker Ambulation/Gait Assistance Details: verbal cues to keep feet inside of walker when turning. Gait Pattern: Step-through pattern;Decreased stride length Gait velocity: decreased    Exercises     PT Diagnosis: Difficulty walking  PT Problem List: Decreased mobility;Decreased balance PT Treatment Interventions: DME  instruction;Gait training;Functional mobility training;Therapeutic activities;Balance training;Therapeutic exercise;Patient/family education     PT Goals(Current goals can be found in the care plan section) Acute Rehab PT Goals Patient Stated Goal: to go home PT Goal Formulation: With patient Time For Goal Achievement: 05/01/13 Potential to Achieve Goals: Good  Visit Information  Last PT Received On: 04/24/13 Assistance Needed: +1 History of Present Illness: Pt adm with urosepsis. Pt just recently dc'd from CIR after removal of brain tumor last month.       Prior Functioning  Home Living Family/patient expects to be discharged to:: Private residence Living Arrangements: Spouse/significant other Available Help at Discharge: Available 24 hours/day Type of Home: House Home Access: Stairs to enter [employer] of Steps: 4 to 5 Entrance Stairs-Rails: Left Home Layout: One level Home Equipment: Walker - 2 wheels;Walker - standard;Hand held shower head;Grab bars - tub/shower;Shower seat - built in  Lives With: Spouse;Daughter Prior Function Level of Independence: Needs assistance Gait / Transfers Assistance Needed: Supervision with amb with rolling walker since dc from hospital. Communication Communication: No difficulties Dominant Hand: Right    Cognition  Cognition Arousal/Alertness: Awake/alert Behavior During Therapy: WFL for tasks assessed/performed Overall Cognitive Status: Within Functional Limits for tasks assessed    Extremity/Trunk Assessment Lower Extremity Assessment Lower Extremity Assessment: Overall WFL for tasks assessed   Balance Static Standing Balance Static Standing - Balance Support: Bilateral upper extremity supported Static Standing - Level of Assistance: 5: Stand by assistance  End of Session PT - End of Session Activity Tolerance: Patient tolerated treatment well Patient left: in bed;with call bell/phone within reach Nurse Communication:  Mobility status  GP     [facility] 04/24/2013, 2:54 PM  [employer] PT  [phone]   

## 2013-04-24 NOTE — Progress Notes (Signed)
Pt ambulated in hall with walker and family assist. When returning to room pt HR up to 146 while using bathroom, sustained for approx 5 min. MD notified per page to answering service at [phone].    Vitals are as follows. Blood pressure 151/67, pulse 107, temperature 98.5 F (36.9 C), temperature source Oral, resp. rate 20, height 5\' 1"  (1.549 m), weight 63.498 kg (139 lb 15.8 oz), SpO2 97.00%.

## 2013-04-24 NOTE — Progress Notes (Signed)
Subjective: Good night, does complain of dry cough.  She states she feels a little stronger  Objective: Vital signs in last 24 hours: Temp:  [98.1 F (36.7 C)-99.2 F (37.3 C)] 98.2 F (36.8 C) (07/05 0500) Pulse Rate:  [86-101] 101 (07/05 0500) Resp:  [20] 20 (07/05 0500) BP: (100-139)/(63-72) 138/71 mmHg (07/05 0500) SpO2:  [93 %-97 %] 93 % (07/05 0500) Weight:  [63.498 kg (139 lb 15.8 oz)] 63.498 kg (139 lb 15.8 oz) (07/04 2045) Weight change: 6.598 kg (14 lb 8.7 oz) Last BM Date: 04/23/13  Intake/Output from previous day: 07/04 0701 - 07/05 0700 In: 920 [P.O.:720; IV Piggyback:200] Out: 802 [Urine:800; Stool:2] Intake/Output this shift:    Resp: clear to auscultation bilaterally Cardio: Regular tachycardia without murmur  Lab Results:  Recent Labs  04/23/13 1005 04/24/13 0440  WBC 5.8 5.9  HGB 11.0* 10.3*  HCT 33.6* 31.7*  PLT 172 182   BMET  Recent Labs  04/23/13 1005 04/24/13 0440  NA 139 137  K 3.3* 3.7  CL 103 101  CO2 24 24  GLUCOSE 165* 132*  BUN 5* 5*  CREATININE 0.56 0.57  CALCIUM 8.3* 8.5    Studies/Results: Dg Chest Port 1 View  04/22/2013   *RADIOLOGY REPORT*  Clinical Data: Weakness, fever, sepsis  PORTABLE CHEST - 1 VIEW  Comparison: 03/26/2013  Findings: Cardiomediastinal silhouette is stable.  No acute infiltrate or pleural effusion.  No pulmonary edema.  Bony thorax is stable.  Mild left basilar atelectasis.  IMPRESSION: No acute infiltrate or pulmonary edema.  Mild left basilar atelectasis.   Original Report Authenticated By: Natasha Mead, M.D.    Medications:  Scheduled: . metoprolol tartrate  25 mg Oral Q12H  . piperacillin-tazobactam (ZOSYN)  IV  3.375 g Intravenous Q8H  . potassium chloride  20 mEq Oral Daily  . vancomycin  750 mg Intravenous Q12H    Assessment/Plan: 1. Probable uti/urosepsis.Marland Kitchenblood culture negative so far.  Urine mixed flora.  She is doing better clinically will switch to po cipro 2. Tachycardia is sinus and  rate better this am 3. Deconditioning will get PT consult to ambulate 4. Dehydration potassium now normal and bun/creat normal will d/c iv fluids to help with mobility    LOS: 2 days   [facility] 04/24/2013, 8:17 AM

## 2013-04-24 NOTE — Progress Notes (Signed)
Pt states she feels she is getting a sinus infection. Noted increased irritability with elevated HR per daughter. Pt has PRN cough medicine, awaiting arrival from pharmacy. Will give and pass on to night RN for relief of sx. Will continue to monitor.

## 2013-04-24 NOTE — Progress Notes (Signed)
Per MD verbal telephone order to change metoprolol to 50 mg BID, give first dose now. Will place order and continue to monitor.

## 2013-04-25 LAB — COMPREHENSIVE METABOLIC PANEL
ALT: 14 U/L (ref 0–35)
AST: 14 U/L (ref 0–37)
Albumin: 2.5 g/dL — ABNORMAL LOW (ref 3.5–5.2)
Alkaline Phosphatase: 66 U/L (ref 39–117)
BUN: 8 mg/dL (ref 6–23)
CO2: 27 mEq/L (ref 19–32)
Creatinine, Ser: 0.59 mg/dL (ref 0.50–1.10)
Glucose, Bld: 131 mg/dL — ABNORMAL HIGH (ref 70–99)
Total Bilirubin: 0.3 mg/dL (ref 0.3–1.2)
Total Protein: 6.2 g/dL (ref 6.0–8.3)

## 2013-04-25 LAB — CBC WITH DIFFERENTIAL/PLATELET
Basophils Absolute: 0 10*3/uL (ref 0.0–0.1)
Basophils Relative: 0 % (ref 0–1)
Eosinophils Absolute: 0.2 10*3/uL (ref 0.0–0.7)
Eosinophils Relative: 3 % (ref 0–5)
HCT: 32.2 % — ABNORMAL LOW (ref 36.0–46.0)
Lymphocytes Relative: 30 % (ref 12–46)
MCH: 29.1 pg (ref 26.0–34.0)
MCHC: 33.5 g/dL (ref 30.0–36.0)
MCV: 86.8 fL (ref 78.0–100.0)
Monocytes Absolute: 0.3 10*3/uL (ref 0.1–1.0)
RDW: 13 % (ref 11.5–15.5)

## 2013-04-25 MED ORDER — ENSURE COMPLETE PO LIQD
237.0000 mL | Freq: Two times a day (BID) | ORAL | Status: DC | PRN
  Administered 2013-04-25: 237 mL via ORAL

## 2013-04-25 MED ORDER — LORATADINE 10 MG PO TABS: 10.0000 mg | ORAL_TABLET | Freq: Every day | ORAL | Status: DC

## 2013-04-25 MED ORDER — METOPROLOL TARTRATE 50 MG PO TABS: 50.0000 mg | ORAL_TABLET | Freq: Two times a day (BID) | ORAL | Status: DC

## 2013-04-25 MED ORDER — LORATADINE 10 MG PO TABS
10.0000 mg | ORAL_TABLET | Freq: Every day | ORAL | Status: DC
  Administered 2013-04-25 – 2013-04-26 (×2): 10 mg via ORAL
  Filled 2013-04-25 (×2): qty 1

## 2013-04-25 MED ORDER — CIPROFLOXACIN HCL 500 MG PO TABS: 500.0000 mg | ORAL_TABLET | Freq: Two times a day (BID) | ORAL | Status: DC

## 2013-04-25 MED ORDER — ALPRAZOLAM 1 MG PO TABS: 1.0000 mg | ORAL_TABLET | Freq: Two times a day (BID) | ORAL | Status: DC | PRN

## 2013-04-25 MED ORDER — ENSURE PUDDING PO PUDG: 1.0000 | Freq: Two times a day (BID) | ORAL | Status: DC | PRN

## 2013-04-25 NOTE — Progress Notes (Signed)
Subjective: The patient complains of post nasal drip and dry cough.  When she ambulated yesterday had tachycardia 140 which was asymptomatic. I have increase metoprolol  Objective: Vital signs in last 24 hours: Temp:  [98.4 F (36.9 C)-98.9 F (37.2 C)] 98.9 F (37.2 C) (07/06 0418) Pulse Rate:  [73-110] 103 (07/06 0418) Resp:  [18-20] 20 (07/06 0418) BP: (137-156)/(50-69) 139/57 mmHg (07/06 0418) SpO2:  [96 %-97 %] 96 % (07/06 0418) Weight:  [63.498 kg (139 lb 15.8 oz)] 63.498 kg (139 lb 15.8 oz) (07/05 2025) Weight change: 0 kg (0 lb) Last BM Date: 04/24/13  Intake/Output from previous day: 07/05 0701 - 07/06 0700 In: 960 [P.O.:960] Out: -  Intake/Output this shift:    Resp: clear to auscultation bilaterally Cardio: regular tachycardia withour murmur  Lab Results:  Recent Labs  04/24/13 0440 04/25/13 0434  WBC 5.9 4.9  HGB 10.3* 10.8*  HCT 31.7* 32.2*  PLT 182 201   BMET  Recent Labs  04/24/13 0440 04/25/13 0434  NA 137 134*  K 3.7 3.6  CL 101 99  CO2 24 27  GLUCOSE 132* 131*  BUN 5* 8  CREATININE 0.57 0.59  CALCIUM 8.5 9.2    Studies/Results: No results found.  Medications:  Scheduled: . ciprofloxacin  500 mg Oral BID  . metoprolol tartrate  50 mg Oral Q12H  . potassium chloride  20 mEq Oral Daily    Assessment/Plan: 1. uti doing well on cipro 2.  Tachycardia etiology not clear is a sinus mechanism,  tsh was normal .742 and I have increased metoprolol, will see how she does with activity today  3. Allergic rhinnitis will start loratadine  LOS: 3 days   [facility] 04/25/2013, 7:54 AM

## 2013-04-26 LAB — CBC WITH DIFFERENTIAL/PLATELET
Basophils Relative: 0 % (ref 0–1)
Eosinophils Absolute: 0.1 10*3/uL (ref 0.0–0.7)
HCT: 34.3 % — ABNORMAL LOW (ref 36.0–46.0)
Hemoglobin: 11.6 g/dL — ABNORMAL LOW (ref 12.0–15.0)
Lymphs Abs: 1.7 10*3/uL (ref 0.7–4.0)
MCH: 29.1 pg (ref 26.0–34.0)
MCHC: 33.8 g/dL (ref 30.0–36.0)
Monocytes Absolute: 0.4 10*3/uL (ref 0.1–1.0)
Monocytes Relative: 6 % (ref 3–12)

## 2013-04-26 LAB — COMPREHENSIVE METABOLIC PANEL
AST: 18 U/L (ref 0–37)
CO2: 29 mEq/L (ref 19–32)
Calcium: 9.6 mg/dL (ref 8.4–10.5)
Creatinine, Ser: 0.66 mg/dL (ref 0.50–1.10)
GFR calc Af Amer: >90 mL/min (ref >90)
GFR calc non Af Amer: 82 mL/min — ABNORMAL LOW (ref >90)
Total Protein: 6.7 g/dL (ref 6.0–8.3)

## 2013-04-26 MED ORDER — METOPROLOL TARTRATE 50 MG PO TABS: 50.0000 mg | ORAL_TABLET | Freq: Two times a day (BID) | ORAL | Status: DC

## 2013-04-26 MED ORDER — CIPROFLOXACIN HCL 500 MG PO TABS: 500.0000 mg | ORAL_TABLET | Freq: Two times a day (BID) | ORAL | Status: DC

## 2013-04-26 NOTE — Care Management Note (Signed)
   CARE MANAGEMENT NOTE 04/26/2013  Patient:  Meghan Yu, Meghan Yu   Account Number:  [id number]  Date Initiated:  04/26/2013  Documentation initiated by:  Roylee Chaffin  Subjective/Objective Assessment:   Referral for [facility] needs.     Action/Plan:   Met with pt husband as pt out of room with PT eval. Per husband pt has DME and HH with [facility], will notify that agency of plan to d/c pt and need to resume services.   Anticipated DC Date:  04/26/2013   Anticipated DC Plan:  HOME W HOME HEALTH SERVICES         Choice offered to / List presented to:  C-3 Spouse        HH arranged  HH-1 RN  HH-2 PT      HH agency  [facility]   Status of service:  Completed, signed off Medicare Important Message given?   (If response is "NO", the following Medicare IM given date fields will be blank) Date Medicare IM given:   Date Additional Medicare IM given:    Discharge Disposition:  HOME W HOME HEALTH SERVICES  Per UR Regulation:    If discussed at Long Length of Stay Meetings, dates discussed:    Comments:  04/26/13 Pt for d/c to home and [facility] to resume [facility] services with in next 72 hr. Pt has DME, no other needs identified. Johny Shock RN MPh Case Manager [phone]

## 2013-04-26 NOTE — Discharge Summary (Signed)
Physician Discharge Summary  Patient ID: Meghan Yu MRN: [id number] DOB/AGE: 77-03-1932 77 y.o.  Admit date: 04/22/2013 Discharge date: 04/26/2013  Admission Diagnoses:  Discharge Diagnoses:  Principal Problem:   Sepsis Active Problems:   Brain tumor   Hypothyroidism   Hyperlipidemia   Dehydration   E. coli UTI   SVT (supraventricular tachycardia)   Hypokalemia   Discharged Condition: good  Hospital Course: 79 years old with a recent history ofleft brain meningioma resection with some right-sided weakness discharge from the rehabilitation about 2 weeks ago, at home with physical therapy found to have weakness change in her status; altered mental status noted by her husband admitted for further evaluation Problem #1: patient was found to have sepsis- with UA positive, UTI , dehydration,and SVT on EKG Chest x-ray was negative White cell count was normal. Patient was admitted with IV hydration IV antibiotic including Zosyn, her urine culture had Escherichia coli earlier but this culture showed multiple organisms, patient was continued on IV antibiotics with IV hydration and IV antibiotics her mental status resolved to normal, she was switched to Cipro p.o. For 7 more days. She remained afebrile, white cell count normal. Problem #2 SVT patient was on telemetry there was no arrhythmias after hydration she remained sinus tachycardia asymptomatic without any chest pain no shortness of breath or dizziness, her blood pressure was marginally elevated. Started on beta blocker heart rate in the 110 to 120s sinus TSH was normal- patient had been on this Synthroid dose 75 mcg. Continue on beta blocker follow up outpatient Problem #3: recent history of a left large meningioma resection with some right-sided weakness,continue her physical therapy will status remained normal right-sided weakness no change Continue ambulating with a walker;continue Keppra for seizure precautions. Follow up with  neurosurgery as scheduled; surgical site looked clean Problem #4 develops a cough and post nasal drainage no evidence of pneumonia, started on Claritin 10 mg Problem #5 mild anxiety continue Xanax p.r.n. Problem #6 hypokalemia patient required potassium supplementation after hydration her pressure remained stable at 3.6 continue monitoring outpatient.Her magnesium level was normal Problem #7 dyslipidemia continue Zocor  Consults: None  Significant Diagnostic Studies: labs: blood chemistries potassium 4.1 sodium 134 creatinine 0.6, hemoglobin 11.6 WBC 5.7 TSH 0.74, microbiology: blood culture: negative and urine culture: mixed flora Escherichia coli in the previous culture and radiology: CXR: normal  Treatments: IV hydration and antibiotics: Zosyn  Discharge Exam: Blood pressure 147/75, pulse 103, temperature 99.6 F (37.6 C), temperature source Oral, resp. rate 20, height 5\' 1"  (1.549 m), weight 63.498 kg (139 lb 15.8 oz), SpO2 95.00%. General appearance: alert Resp: clear to auscultation bilaterally Cardio: sinus tachycardia GI: soft, non-tender; bowel sounds normal; no masses,  no organomegaly  Disposition: 06-Home-Health Care Svc  Discharge Orders   Future Appointments Provider Department Dept Phone   04/27/2013 11:45 AM [facility] [phone]   04/27/2013 12:00 PM Erick Colace, MD [facility] [phone]   Future Orders Complete By Expires     Diet - low sodium heart healthy  As directed     Discharge instructions  As directed     Comments:      Resume HHN/  PT-  For right side weakness,  generalized weakness    Increase activity slowly  As directed         Medication List         ALPRAZolam 1 MG tablet  Commonly known as:  XANAX  Take  1 tablet (1 mg total) by mouth 2 (two) times daily as needed for sleep or anxiety.     calcium carbonate 600 MG Tabs  Commonly known as:  OS-CAL  Take 600  mg by mouth 2 (two) times daily with a meal.     ciprofloxacin 500 MG tablet  Commonly known as:  CIPRO  Take 1 tablet (500 mg total) by mouth 2 (two) times daily.     esomeprazole 40 MG capsule  Commonly known as:  NEXIUM  Take 1 capsule (40 mg total) by mouth daily before breakfast.     Fish Oil 1000 MG Caps  Take 1-2 capsules (1,000-2,000 mg total) by mouth 2 (two) times daily. Takes 2000 mg in the morning and 1000 mg in the evening     levETIRAcetam 500 MG tablet  Commonly known as:  KEPPRA  Take 1 tablet (500 mg total) by mouth 2 (two) times daily.     levothyroxine 75 MCG tablet  Commonly known as:  SYNTHROID, LEVOTHROID  Take 1 tablet (75 mcg total) by mouth daily before breakfast.     loratadine 10 MG tablet  Commonly known as:  CLARITIN  Take 1 tablet (10 mg total) by mouth daily.     magnesium chloride 64 MG Tbec  Commonly known as:  SLOW-MAG  Take 1 tablet (64 mg total) by mouth daily.     metoprolol 50 MG tablet  Commonly known as:  LOPRESSOR  Take 1 tablet (50 mg total) by mouth every 12 (twelve) hours.     multivitamin with minerals Tabs  Take 1 tablet by mouth daily.     senna-docusate 8.6-50 MG per tablet  Commonly known as:  Senokot-S  Take 1 tablet by mouth at bedtime.     simvastatin 20 MG tablet  Commonly known as:  ZOCOR  Take 1 tablet (20 mg total) by mouth every evening.     Vitamin D 2000 UNITS Caps  Take 1 capsule by mouth daily.           Follow-up Information   Follow up In 7 days.      Follow up with Georgann Housekeeper, MD.   Contact information:   [address] [place] [address] [phone]     discharge planning time total 45 minutes  Signed: Mearl Olver 04/26/2013, 8:03 AM

## 2013-04-26 NOTE — Progress Notes (Signed)
04/26/2013 Patient heart rate at 0800 was 135 and at 0830 increase to 140's, Md is aware and was notified. Patient was given schedule lopressor. Continue to monitor. [facility] RN.

## 2013-04-26 NOTE — Progress Notes (Signed)
Physical Therapy Treatment Patient Details Name: Meghan Yu MRN: [id number] DOB: [birth year] Today's Date: 04/26/2013 Time: 4098-1191 PT Time Calculation (min): 13 min  PT Assessment / Plan / Recommendation  PT Comments   Pt supervision level for mobility due to verbal cues for safe technique and HR increased to 145 bpm during gait.   Follow Up Recommendations  Home health PT     Does the patient have the potential to tolerate intense rehabilitation     Barriers to Discharge        Equipment Recommendations  None recommended by PT    Recommendations for Other Services    Frequency     Progress towards PT Goals Progress towards PT goals: Progressing toward goals  Plan Current plan remains appropriate    Precautions / Restrictions Precautions Precautions: Fall Precaution Comments: monitor HR   Pertinent Vitals/Pain HR up to 145 during ambulation (RN reports elevated HR with rolling in bed as well)    Mobility  Bed Mobility Supine to Sit: 6: Modified independent (Device/Increase time) Sitting - Scoot to Edge of Bed: 6: Modified independent (Device/Increase time) Sit to Supine: 6: Modified independent (Device/Increase time) Details for Bed Mobility Assistance: incr time Transfers Sit to Stand: 5: Supervision;With upper extremity assist;From bed Stand to Sit: 5: Supervision;With upper extremity assist;To bed Ambulation/Gait Ambulation/Gait Assistance: 5: Supervision Ambulation Distance (Feet): 300 Feet Assistive device: Rolling walker Ambulation/Gait Assistance Details: verbal cues for safe RW distance, HR increased to 145 during ambulation (RN notified), pt denies any symptoms Gait Pattern: Step-through pattern;Decreased stride length Gait velocity: decreased General Gait Details: verbal cues for turning with RW    Exercises     PT Diagnosis:    PT Problem List:   PT Treatment Interventions:     PT Goals (current goals can now be found in the care plan section)     Visit Information  Last PT Received On: 04/26/13 Assistance Needed: +1 History of Present Illness: Pt adm with urosepsis. Pt just recently dc'd from CIR after removal of brain tumor last month.    Subjective Data      Cognition  Cognition Arousal/Alertness: Awake/alert Behavior During Therapy: WFL for tasks assessed/performed    Balance     End of Session PT - End of Session Activity Tolerance: Patient tolerated treatment well Patient left: in bed;with call bell/phone within reach;with family/visitor present Nurse Communication: Other (comment) (elevated HR with ambulation)   GP     Khoen Genet,KATHrine E 04/26/2013, 9:35 AM Zenovia Jarred, PT, DPT 04/26/2013 Pager: [phone]

## 2013-04-27 ENCOUNTER — Encounter: Payer: Medicare Other | Attending: Physical Medicine & Rehabilitation

## 2013-04-27 ENCOUNTER — Inpatient Hospital Stay: Payer: Medicare Other | Admitting: Physical Medicine & Rehabilitation

## 2013-04-28 LAB — CULTURE, BLOOD (ROUTINE X 2)
Culture: NO GROWTH
Culture: NO GROWTH

## 2013-06-04 ENCOUNTER — Other Ambulatory Visit: Payer: Self-pay | Admitting: Neurological Surgery

## 2013-06-04 DIAGNOSIS — D320 Benign neoplasm of cerebral meninges: Secondary | ICD-10-CM

## 2013-06-30 ENCOUNTER — Ambulatory Visit
Admission: RE | Admit: 2013-06-30 | Discharge: 2013-06-30 | Disposition: A | Discharge status: Home / Self Care | Payer: Medicare Other | Source: Ambulatory Visit | Attending: Neurological Surgery | Admitting: Neurological Surgery

## 2013-06-30 DIAGNOSIS — D320 Benign neoplasm of cerebral meninges: Secondary | ICD-10-CM

## 2013-06-30 MED ORDER — GADOBENATE DIMEGLUMINE 529 MG/ML IV SOLN
12.0000 mL | Freq: Once | INTRAVENOUS | Status: AC | PRN
  Administered 2013-06-30: 12 mL via INTRAVENOUS

## 2013-07-27 ENCOUNTER — Other Ambulatory Visit: Payer: Self-pay

## 2013-07-27 DIAGNOSIS — Z1231 Encounter for screening mammogram for malignant neoplasm of breast: Secondary | ICD-10-CM

## 2013-08-16 ENCOUNTER — Other Ambulatory Visit: Payer: Self-pay | Admitting: Radiation Therapy

## 2013-08-16 DIAGNOSIS — D329 Benign neoplasm of meninges, unspecified: Secondary | ICD-10-CM

## 2013-08-27 ENCOUNTER — Ambulatory Visit: Payer: Medicare Other

## 2013-08-31 ENCOUNTER — Other Ambulatory Visit: Payer: Medicare Other

## 2013-09-03 ENCOUNTER — Ambulatory Visit: Payer: Medicare Other

## 2013-09-24 ENCOUNTER — Ambulatory Visit: Payer: Medicare Other

## 2013-10-05 ENCOUNTER — Ambulatory Visit
Admission: RE | Admit: 2013-10-05 | Discharge: 2013-10-05 | Disposition: A | Discharge status: Home / Self Care | Payer: Medicare Other | Source: Ambulatory Visit | Attending: Neurological Surgery | Admitting: Neurological Surgery

## 2013-10-05 ENCOUNTER — Other Ambulatory Visit: Payer: Medicare Other

## 2013-10-05 DIAGNOSIS — D329 Benign neoplasm of meninges, unspecified: Secondary | ICD-10-CM

## 2013-10-05 MED ORDER — GADOBENATE DIMEGLUMINE 529 MG/ML IV SOLN
11.0000 mL | Freq: Once | INTRAVENOUS | Status: AC | PRN
  Administered 2013-10-05: 11 mL via INTRAVENOUS

## 2013-10-06 ENCOUNTER — Ambulatory Visit: Payer: Medicare Other | Admitting: Radiation Oncology

## 2014-07-05 ENCOUNTER — Other Ambulatory Visit: Payer: Self-pay

## 2014-07-05 DIAGNOSIS — Z1231 Encounter for screening mammogram for malignant neoplasm of breast: Secondary | ICD-10-CM

## 2014-07-28 ENCOUNTER — Other Ambulatory Visit: Payer: Self-pay | Admitting: Radiation Therapy

## 2014-07-28 DIAGNOSIS — D329 Benign neoplasm of meninges, unspecified: Secondary | ICD-10-CM

## 2014-09-23 ENCOUNTER — Ambulatory Visit
Admission: RE | Admit: 2014-09-23 | Discharge: 2014-09-23 | Disposition: A | Discharge status: Home / Self Care | Payer: Medicare Other | Source: Ambulatory Visit

## 2014-09-23 DIAGNOSIS — Z1231 Encounter for screening mammogram for malignant neoplasm of breast: Secondary | ICD-10-CM

## 2014-09-29 ENCOUNTER — Ambulatory Visit
Admission: RE | Admit: 2014-09-29 | Discharge: 2014-09-29 | Disposition: A | Discharge status: Home / Self Care | Payer: Medicare Other | Source: Ambulatory Visit | Attending: Radiation Oncology | Admitting: Radiation Oncology

## 2014-09-29 DIAGNOSIS — D329 Benign neoplasm of meninges, unspecified: Secondary | ICD-10-CM

## 2014-09-29 MED ORDER — GADOBENATE DIMEGLUMINE 529 MG/ML IV SOLN
11.0000 mL | Freq: Once | INTRAVENOUS | Status: AC | PRN
  Administered 2014-09-29: 11 mL via INTRAVENOUS

## 2015-04-13 ENCOUNTER — Other Ambulatory Visit: Payer: Self-pay | Admitting: Internal Medicine

## 2015-04-13 ENCOUNTER — Ambulatory Visit
Admission: RE | Admit: 2015-04-13 | Discharge: 2015-04-13 | Disposition: A | Discharge status: Home / Self Care | Payer: Medicare Other | Source: Ambulatory Visit | Attending: Internal Medicine | Admitting: Internal Medicine

## 2015-04-13 DIAGNOSIS — M545 Low back pain: Secondary | ICD-10-CM

## 2015-04-13 DIAGNOSIS — R0781 Pleurodynia: Secondary | ICD-10-CM

## 2015-04-14 ENCOUNTER — Other Ambulatory Visit: Payer: Self-pay | Admitting: Internal Medicine

## 2015-04-14 DIAGNOSIS — R270 Ataxia, unspecified: Secondary | ICD-10-CM

## 2015-04-14 DIAGNOSIS — D329 Benign neoplasm of meninges, unspecified: Secondary | ICD-10-CM

## 2015-04-16 ENCOUNTER — Ambulatory Visit
Admission: RE | Admit: 2015-04-16 | Discharge: 2015-04-16 | Disposition: A | Discharge status: Home / Self Care | Payer: Medicare Other | Source: Ambulatory Visit | Attending: Internal Medicine | Admitting: Internal Medicine

## 2015-04-16 DIAGNOSIS — R270 Ataxia, unspecified: Secondary | ICD-10-CM

## 2015-04-16 DIAGNOSIS — D329 Benign neoplasm of meninges, unspecified: Secondary | ICD-10-CM

## 2015-04-16 MED ORDER — GADOBENATE DIMEGLUMINE 529 MG/ML IV SOLN
10.0000 mL | Freq: Once | INTRAVENOUS | Status: AC | PRN
  Administered 2015-04-16: 10 mL via INTRAVENOUS

## 2015-04-18 ENCOUNTER — Other Ambulatory Visit: Payer: Self-pay | Admitting: Internal Medicine

## 2015-04-19 ENCOUNTER — Other Ambulatory Visit: Payer: Self-pay | Admitting: Internal Medicine

## 2015-04-19 DIAGNOSIS — R270 Ataxia, unspecified: Secondary | ICD-10-CM

## 2015-04-19 DIAGNOSIS — D329 Benign neoplasm of meninges, unspecified: Secondary | ICD-10-CM

## 2015-05-11 ENCOUNTER — Encounter: Payer: Self-pay | Admitting: Radiation Oncology

## 2015-05-11 ENCOUNTER — Ambulatory Visit: Payer: Medicare Other | Admitting: Radiation Oncology

## 2015-05-11 ENCOUNTER — Ambulatory Visit: Payer: Medicare Other

## 2015-05-11 NOTE — Progress Notes (Addendum)
Histology and Location of Primary Cancer: Left frontal convexity meningioma  Is bigger aslo, Right Meningioma right parietal lobe 10 mm in size but progression of the two other large meningioma's   Past/Anticipated chemotherapy by medical oncology, if any: No  Pain on a scale of 0-10 is: 0  Ambulatory status? Walker? Wheelchair?: uses a cane.  Drags her right foot.  Recent neurologic symptoms, if any:   Seizures: none  Headaches: no  Nausea: no  Dizziness/ataxia: no  Difficulty with hand coordination: yes  Focal numbness/weakness: no  Visual deficits/changes: yes  Confusion/Memory deficits: no   SAFETY ISSUES:  Prior radiation?  no  Pacemaker/ICD? NO  Possible current pregnancy? NO  Is the patient on methotrexate?  NO  Current Complaints / other details:  Married,   Left frontal craniotomy and gross total resection of meningioma  03/30/13 Dr. Mallie Mussel Elsner,MD, Anxiety  Non smoker, no smokeless tobacco no alcohol or illicit drugs, Hx SVT ,Hx E coli UTI,Sepsis, 2014, Mother stroke, Father Cirrhosis, Sister Emphysema  She is here with her daughter.  Her daughter reports she has had recent falls and had cracked ribs on her right side.  This happened in the last month.  Patient denies headaches.  She is not taking decadron and is not sure she is taking Keppra.  She denies vision changes but says she does need to see an eye doctor.  She denies nausea.     Allergies:NKA.  BP 149/64 mmHg  Pulse 72  Temp(Src) 97.4 F (36.3 C) (Oral)  Resp 20  Ht 4\' 11"  (1.499 m)  Wt 137 lb 1.6 oz (62.188 kg)  BMI 27.68 kg/m2  SpO2 95%

## 2015-05-12 ENCOUNTER — Ambulatory Visit
Admission: RE | Admit: 2015-05-12 | Discharge: 2015-05-12 | Disposition: A | Discharge status: Home / Self Care | Payer: Medicare Other | Source: Ambulatory Visit | Attending: Radiation Oncology | Admitting: Radiation Oncology

## 2015-05-12 ENCOUNTER — Ambulatory Visit
Admission: RE | Admit: 2015-05-12 | Discharge: 2015-05-12 | Disposition: A | Discharge status: Home / Self Care | Payer: Medicare Other | Source: Ambulatory Visit

## 2015-05-12 ENCOUNTER — Encounter: Payer: Self-pay | Admitting: Radiation Oncology

## 2015-05-12 VITALS — BP 149/64 | HR 72 | Temp 97.4°F | Resp 20 | Ht 59.0 in | Wt 137.1 lb

## 2015-05-12 DIAGNOSIS — K635 Polyp of colon: Secondary | ICD-10-CM | POA: Insufficient documentation

## 2015-05-12 DIAGNOSIS — D320 Benign neoplasm of cerebral meninges: Secondary | ICD-10-CM | POA: Diagnosis present

## 2015-05-12 DIAGNOSIS — F419 Anxiety disorder, unspecified: Secondary | ICD-10-CM | POA: Diagnosis not present

## 2015-05-12 DIAGNOSIS — Z510 Encounter for antineoplastic radiation therapy: Secondary | ICD-10-CM | POA: Insufficient documentation

## 2015-05-12 DIAGNOSIS — D420 Neoplasm of uncertain behavior of cerebral meninges: Secondary | ICD-10-CM

## 2015-05-12 DIAGNOSIS — E039 Hypothyroidism, unspecified: Secondary | ICD-10-CM | POA: Diagnosis not present

## 2015-05-12 DIAGNOSIS — E785 Hyperlipidemia, unspecified: Secondary | ICD-10-CM | POA: Diagnosis not present

## 2015-05-12 DIAGNOSIS — Z9889 Other specified postprocedural states: Secondary | ICD-10-CM | POA: Diagnosis not present

## 2015-05-12 HISTORY — DX: Sepsis, unspecified organism: A41.9

## 2015-05-12 HISTORY — DX: Malignant neoplasm of brain, unspecified: C71.9

## 2015-05-12 HISTORY — DX: Supraventricular tachycardia: I47.1

## 2015-05-12 HISTORY — DX: Urinary tract infection, site not specified: N39.0

## 2015-05-12 NOTE — Addendum Note (Signed)
Encounter addended by: Eppie Gibson, MD on: 05/12/2015  8:18 PM<BR>     Documentation filed: [facility] Follow-up Actions, Orders, Dx Association, Flowsheet VN

## 2015-05-12 NOTE — Progress Notes (Signed)
Please see the Nurse Progress Note in the MD Initial Consult Encounter for this patient. 

## 2015-05-12 NOTE — Progress Notes (Signed)
Radiation Oncology         [phone] ________________________________  Initial outpatient Consultation  Name: Meghan Yu MRN: [id number]  Date: 05/12/2015  DOB: 05/18/1932  IR:CVELFY,BOFBPZ, MD  Kristeen Miss, MD   REFERRING PHYSICIAN: Kristeen Miss, MD  DIAGNOSIS:    ICD-9-CM ICD-10-CM   1. Meningioma, recurrent of brain 225.2 D32.0 Ambulatory referral to Social Work  2. 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 female who presented with a personal history of meningioma. She was s/p Left frontal craniotomy and gross total resection of meningioma on 03/30/13 by Dr. Kristeen Miss; Revealed WHO Grade II meningioma.  3 months post op, MRI of brain revealed residual right parafalcine menigioma. Also a stable right temporal meningioma.   Serial scans since then have shown progressive meningioma along the falx and left frontal convexity.  The  right temporal meningioma is much smaller compared to the other tumors and relatively stable.  She is here with her daughter. Her daughter reports she has had recent falls and had cracked ribs on her right side. This happened in the past month. She is not taking decadron. The pt's daughter states that the pt's physician has taken her off of Keppra. Pt denies prior seizures, nausea, headaches, and vision changes. The pt states she needs an opthamologist. Pt denies heart attack, stroke, or other cancers. Pt states she has weakness in her legs with the right leg having more weakness. The pt states she had the weakness prior to surgery. The pt attributes it to arthritis. She also states she has more weakness in her right arm. This R weakness may have improved after surgery in 2014 for her meningioma, then worsening more recently.   Pain on a scale of 0-10 is: 0     PREVIOUS RADIATION THERAPY: No  PAST MEDICAL HISTORY:  has a past medical history of Hyperlipidemia; Anxiety disorder; Arthritis of  knee, right; Colon polyps; Brain cancer (03/30/13); Hypothyroidism; UTI (lower urinary tract infection) (2014); Degenerative arthritis of hand; Sepsis; and SVT (supraventricular tachycardia) (2014).    PAST SURGICAL HISTORY: Past Surgical History  Procedure Laterality Date  . Knee arthroscopy    . Cholecystectomy    . Abdominal hysterectomy    . Craniotomy Left 03/30/2013    Procedure: LEFT FRONTAL CRANIOTOMY FOR RESECTION OF MENINGIOMA;  Surgeon: Kristeen Miss, MD;  Location: [facility];  Service: Neurosurgery;  Laterality: Left;  Left Frontal Craniotomy for tumor excision    FAMILY HISTORY: family history includes Cirrhosis in her father; Emphysema in her sister; Rectal cancer in her sister; Stroke in her mother.  SOCIAL HISTORY:  reports that she has never smoked. She does not have any smokeless tobacco history on file. She reports that she does not drink alcohol or use illicit drugs.  ALLERGIES: Review of patient's allergies indicates no known allergies.  MEDICATIONS:  Current Outpatient Prescriptions  Medication Sig Dispense Refill  . ALPRAZolam (XANAX) 1 MG tablet Take 1 tablet (1 mg total) by mouth 2 (two) times daily as needed for sleep or anxiety. 30 tablet 0  . calcium carbonate (OS-CAL) 600 MG TABS Take 600 mg by mouth 2 (two) times daily with a meal.    . cetirizine (ZYRTEC) 10 MG tablet Take 10 mg by mouth daily.    . Cholecalciferol (VITAMIN D) 2000 UNITS CAPS Take 1 capsule by mouth daily.    Marland Kitchen esomeprazole (NEXIUM) 40 MG capsule Take 1 capsule (40 mg  total) by mouth daily before breakfast. 30 capsule 1  . loratadine (CLARITIN) 10 MG tablet Take 1 tablet (10 mg total) by mouth daily. 30 tablet 0  . metoprolol (LOPRESSOR) 50 MG tablet Take 1 tablet (50 mg total) by mouth every 12 (twelve) hours. 60 tablet 6  . simvastatin (ZOCOR) 20 MG tablet Take 1 tablet (20 mg total) by mouth every evening. 30 tablet 1  . ciprofloxacin (CIPRO) 500 MG tablet Take 1 tablet (500 mg total) by  mouth 2 (two) times daily. (Patient not taking: Reported on 05/12/2015) 14 tablet 0  . levETIRAcetam (KEPPRA) 500 MG tablet Take 1 tablet (500 mg total) by mouth 2 (two) times daily. 60 tablet 1  . levothyroxine (SYNTHROID, LEVOTHROID) 75 MCG tablet Take 1 tablet (75 mcg total) by mouth daily before breakfast. (Patient not taking: Reported on 05/12/2015) 30 tablet 1  . magnesium chloride (SLOW-MAG) 64 MG TBEC Take 1 tablet (64 mg total) by mouth daily. (Patient not taking: Reported on 05/12/2015) 60 tablet 1  . Multiple Vitamin (MULTIVITAMIN WITH MINERALS) TABS Take 1 tablet by mouth daily. (Patient not taking: Reported on 05/12/2015)    . Omega-3 Fatty Acids (FISH OIL) 1000 MG CAPS Take 1-2 capsules (1,000-2,000 mg total) by mouth 2 (two) times daily. Takes 2000 mg in the morning and 1000 mg in the evening (Patient not taking: Reported on 05/12/2015) 60 capsule 0  . senna-docusate (SENOKOT-S) 8.6-50 MG per tablet Take 1 tablet by mouth at bedtime. (Patient not taking: Reported on 05/12/2015) 2 tablet 0   No current facility-administered medications for this encounter.    REVIEW OF SYSTEMS:  Notable for that above.   PHYSICAL EXAM:  height is 4\' 11"  (1.499 m) and weight is 137 lb 1.6 oz (62.188 kg). Her oral temperature is 97.4 F (36.3 C). Her blood pressure is 149/64 and her pulse is 72. Her respiration is 20 and oxygen saturation is 95%.   General: Alert and oriented, in no acute distress HEENT: Head is normocephalic. Extraocular movements are intact. Oropharynx is clear. Upper dentures noted. No thrush. Oral cavity is moist. Neck: Neck is supple, no palpable cervical or supraclavicular lymphadenopathy. Heart: Regular in rate and rhythm with no murmurs, rubs, or gallops. Chest: Clear to auscultation bilaterally, with no rhonchi, wheezes, or rales. Abdomen: Soft, nontender, nondistended, with no rigidity or guarding. Extremities: No cyanosis or edema. Lymphatics: see Neck Exam Skin: No concerning  lesions. Musculoskeletal: Very subtle weakness in the right arm. Decreased right hip flexion strength. Finger to nose test is intact. Neurologic: Cranial nerves II through XII are grossly intact, but decreased hearing bilaterally. No obvious focalities. Speech is fluent. Coordination is intact.  The pt knows location, but cannot recall the current month or current president. However, she can recall the current year. She can recall 2 of 3 objects immediately and after a few minutes, she could still recall those 2 objects.  Psychiatric:  Affect is appropriate.  KPS = 60  100 - Normal; no complaints; no evidence of disease. 90   - Able to carry on normal activity; minor signs or symptoms of disease. 80   - Normal activity with effort; some signs or symptoms of disease. 54   - Cares for self; unable to carry on normal activity or to do active work. 60   - Requires occasional assistance, but is able to care for most of his personal needs. 50   - Requires considerable assistance and frequent medical care. 40   -  Disabled; requires special care and assistance. 33   - Severely disabled; hospital admission is indicated although death not imminent. 49   - Very sick; hospital admission necessary; active supportive treatment necessary. 10   - Moribund; fatal processes progressing rapidly. 0     - Dead  Karnofsky DA, Abelmann [place], Craver LS and [place] [phone]) The use of the nitrogen mustards in the palliative treatment of carcinoma: with particular reference to bronchogenic carcinoma Cancer 1 634-56  LABORATORY DATA:  Lab Results  Component Value Date   WBC 5.7 04/26/2013   HGB 11.6* 04/26/2013   HCT 34.3* 04/26/2013   MCV 86.2 04/26/2013   PLT 218 04/26/2013   CMP     Component Value Date/Time   NA 134* 04/26/2013 0500   K 4.1 04/26/2013 0500   CL 96 04/26/2013 0500   CO2 29 04/26/2013 0500   GLUCOSE 144* 04/26/2013 0500   BUN 10 04/26/2013 0500   CREATININE 0.66 04/26/2013 0500    CALCIUM 9.6 04/26/2013 0500   PROT 6.7 04/26/2013 0500   ALBUMIN 2.7* 04/26/2013 0500   AST 18 04/26/2013 0500   ALT 14 04/26/2013 0500   ALKPHOS 68 04/26/2013 0500   BILITOT 0.2* 04/26/2013 0500   GFRNONAA 82* 04/26/2013 0500   GFRAA >90 04/26/2013 0500         RADIOGRAPHY: Dg Ribs Unilateral W/chest Right  04/13/2015   CLINICAL DATA:  Fall.  Right rib pain.  EXAM: RIGHT RIBS AND CHEST - 3+ VIEW  COMPARISON:  04/22/2013, 03/26/2013  FINDINGS: Bibasilar subsegmental atelectasis. Tiny density node in the right upper lobe. This could represent a focal area of atelectasis. Followup PA and lateral chest x-ray suggested to exclude pulmonary nodule. Cardiomegaly. No pulmonary venous congestion. No pneumothorax. Nondisplaced right posterior and posterior lateral sixth and right posterior lateral seventh rib fractures noted. Age undetermined. Prior cholecystectomy.  IMPRESSION: 1. Nondisplaced right posterior and posterior lateral sixth and right posterior lateral seventh rib fractures noted. Age of fractures undetermined. 2. Bibasilar subsegmental atelectasis. Tiny density is noted a right upper lobe. This may represent a focal area of atelectasis. Follow-up PA and lateral chest x-ray is suggested to exclude pulmonary nodule.   Electronically Signed   By: Marcello Moores  Register   On: 04/13/2015 15:55   Dg Lumbar Spine 2-3 Views  04/13/2015   CLINICAL DATA:  Fall twice this week.  Low back pain  EXAM: LUMBAR SPINE - 2-3 VIEW  COMPARISON:  None.  FINDINGS: Moderate compression fracture superior endplate of L2 of indeterminate age. This could be chronic. If the patient has acute symptoms, MRI suggested to evaluate for bone marrow edema. No other fracture or mass. Normal alignment. Normal disc spaces. No pars defect. Mild dextroscoliosis.  IMPRESSION: Moderate compression fracture L2. Given the patient's symptoms, MRI recommended for further evaluation to determine if this is acute.  Mild dextroscoliosis.    Electronically Signed   By: Franchot Gallo M.D.   On: 04/13/2015 15:51   Mr Jeri Cos SJ Contrast  04/16/2015   CLINICAL DATA:  Continued surveillance of multiple meningiomas. Patient complains of increasing ataxia and falls. Subsequent encounter.  EXAM: MRI HEAD WITHOUT AND WITH CONTRAST  TECHNIQUE: Multiplanar, multiecho pulse sequences of the brain and surrounding structures were obtained without and with intravenous contrast.  CONTRAST:  66mL MULTIHANCE GADOBENATE DIMEGLUMINE 529 MG/ML IV SOLN  BUN and creatinine were obtained on site at [facility] at  [address]  Results:  BUN 09/29/2014. Mg/dL,  Creatinine  0.7 mg/dL.  COMPARISON:  None.  FINDINGS: The patient is status post surgery and radiotherapy for LEFT frontal meningioma. There is a large area of surgical encephalomalacia associated with residual/recurrent tumor described below.  On diffusion-weighted imaging, the areas of previous concern have normalized. No areas concerning for acute infarction or post treatment effect are seen on diffusion imaging today.  There is generalized atrophy. Mild chronic microvascular ischemic change is present, baseline. Chronic LEFT medial frontal infarct also affects the corpus callosum/forceps minor. Chronic blood products in and around the surgical bed.  Progressive vasogenic edema affecting the RIGHT frontal and LEFT frontal subcortical white matter. This relates to interval growth of tumor described below. No similar edema in the RIGHT temporal lobe.  Unremarkable pituitary and cerebellar tonsils. Mild pannus. No acute osseous findings.  Abnormal enhancement at three separate locations as follows:  - stable RIGHT temporal meningioma with only slight mass effect on the middle temporal gyrus. Today's measurements 8 x 10 x 9 mm are unchanged.  - superiorly, anteriorly, and and medially at the site of previous craniotomy, there is recurrent tumor showing significant interval growth from 6 months earlier.  This measures 21 x 11 x 9 mm (R-L x A-P x C-C) on post contrast imaging.  - residual/recurrent tumor in the falx, projecting more to the RIGHT than the LEFT. Only slight increase cross-section on postcontrast imaging measuring 14 x 32 x 31 mm (R-L x A-P x C-C) , but there are subtle contour changes most notably along the convexity on the RIGHT (coronal image 15 series 15) and projecting into the postsurgical cavity on the LEFT (image 14 series 15). The tumor invades the superior sagittal sinus as it did previously, but the mid to distal thirds of the SSS are patent.  IMPRESSION: Stable RIGHT temporal meningioma.  Slight interval growth of residual falx meningioma.  Significant interval growth of residual LEFT frontal convexity meningioma.  Normalization of previous areas restricted diffusion medial and posterior LEFT frontal lobe.   Electronically Signed   By: Staci Righter M.D.   On: 04/16/2015 19:41      IMPRESSION/PLAN: Today, I talked to the patient about the findings and work-up thus far. We discussed the patient's diagnosis of recurrent WHO grade II meningioma and general treatment for this, highlighting the role of radiotherapy in the management. We discussed the available radiation techniques, and focused on the details of logistics and delivery.  I recommend approximately 7 weeks of external beam RT; we discussed a more convenient but less standard regimen of 5 fractions of SRS as well. They prefer the more standard 7 week regimen which I believe would be safer and w/a lower risk of marginal failtures   We discussed the risks, benefits, and side effects of radiotherapy. Side effects may include but not necessarily be limited to: fatigue, skin irritation, alopecia, headaches, nausea, brain injury, cognitive decline. We planned to simulate the pt in early August and anticipate approximately  7 weeks of radiotherapy. No guarantees of treatment were given. A consent form was signed and placed in the  patient's medical record. The patient was encouraged to ask questions that I answered to the best of my ability.   The pt's daughter states that she works long shifts and will only be able to bring her mother in during the late afternoon. I have written a prescription for a wig since the pt will likely lose hair during her radiation treatment.  The pt's daughter signed the consent form and this  was placed in the patient's chart.     This document serves as a record of services personally performed by Eppie Gibson, MD. It was created on her behalf by Darcus Austin, a trained medical scribe. The creation of this record is based on the scribe's personal observations and the provider's statements to them. This document has been checked and approved by the attending provider.     __________________________________________   Eppie Gibson, MD

## 2015-05-19 ENCOUNTER — Encounter: Payer: Self-pay | Admitting: *Deleted

## 2015-05-19 NOTE — Progress Notes (Signed)
[facility] Psychosocial Distress Screening Clinical Social Work  Clinical Social Work was referred by distress screening protocol.  The patient scored a 5 on the Psychosocial Distress Thermometer which indicates moderate distress. Clinical Social Worker phoned pt and daughter to assess for distress and other psychosocial needs. CSW introduced self and explained role of CSW and Pt and Family Support Team. CSW also discussed Brain Tumor Support group as an option for support. Daughter reports she works, so cannot attend but plans to provide transportation for her mother to appointments. They are currently concerned about their home setup for bathing and may benefit from [facility] PT/OT consult to come out an assess their situation. CSW will share this need with the medical team. CSW explained availability of [facility] team at future appointments as needs arise.   ONCBCN DISTRESS SCREENING 05/12/2015  Screening Type Initial Screening  Distress experienced in past week (1-10) 5  Emotional problem type Depression;Adjusting to illness;Isolation/feeling alone;Boredom;Adjusting to appearance changes  Information Concerns Type Lack of info about diagnosis;Lack of info about treatment  Physical Problem type Getting around;Bathing/dressing  Physician notified of physical symptoms Yes    Clinical Social Worker follow up needed: Yes.    If yes, follow up plan: See above Loren Racer, [place] Worker [place]  [facility] Phone: [phone] Fax: [phone]

## 2015-05-22 ENCOUNTER — Other Ambulatory Visit: Payer: Self-pay | Admitting: Radiation Oncology

## 2015-05-22 DIAGNOSIS — D420 Neoplasm of uncertain behavior of cerebral meninges: Secondary | ICD-10-CM

## 2015-05-26 ENCOUNTER — Ambulatory Visit
Admission: RE | Admit: 2015-05-26 | Discharge: 2015-05-26 | Disposition: A | Discharge status: Home / Self Care | Payer: Medicare Other | Source: Ambulatory Visit | Attending: Radiation Oncology | Admitting: Radiation Oncology

## 2015-05-26 ENCOUNTER — Ambulatory Visit: Admission: RE | Admit: 2015-05-26 | Payer: Medicare Other | Source: Ambulatory Visit

## 2015-05-26 VITALS — BP 164/72 | HR 67 | Temp 97.4°F | Resp 16 | Ht 59.0 in | Wt 135.7 lb

## 2015-05-26 DIAGNOSIS — D420 Neoplasm of uncertain behavior of cerebral meninges: Secondary | ICD-10-CM

## 2015-05-26 DIAGNOSIS — D496 Neoplasm of unspecified behavior of brain: Secondary | ICD-10-CM

## 2015-05-26 DIAGNOSIS — D320 Benign neoplasm of cerebral meninges: Secondary | ICD-10-CM | POA: Diagnosis present

## 2015-05-26 DIAGNOSIS — Z510 Encounter for antineoplastic radiation therapy: Secondary | ICD-10-CM | POA: Diagnosis not present

## 2015-05-26 LAB — BASIC METABOLIC PANEL (CC13)
Anion Gap: 8 mEq/L (ref 3–11)
BUN: 22.1 mg/dL (ref 7.0–26.0)
CALCIUM: 9.5 mg/dL (ref 8.4–10.4)
CO2: 29 meq/L (ref 22–29)
Chloride: 106 mEq/L (ref 98–109)
Creatinine: 0.8 mg/dL (ref 0.6–1.1)
EGFR: 70 mL/min/{1.73_m2} — ABNORMAL LOW (ref >90)
Glucose: 96 mg/dl (ref 70–140)
POTASSIUM: 4.2 meq/L (ref 3.5–5.1)
Sodium: 143 mEq/L (ref 136–145)

## 2015-05-26 MED ORDER — SODIUM CHLORIDE 0.9 % IJ SOLN
10.0000 mL | Freq: Once | INTRAMUSCULAR | Status: AC
  Administered 2015-05-26: 10 mL via INTRAVENOUS

## 2015-05-26 NOTE — Progress Notes (Signed)
  Radiation Oncology         [phone] ________________________________  Name: Meghan Yu MRN: [id number]  Date: 05/26/2015  DOB: 1932-07-30  SIMULATION AND TREATMENT PLANNING NOTE  Outpatient  DIAGNOSIS:     ICD-9-CM ICD-10-CM   1. Atypical meningioma of brain 225.2 D32.0     NARRATIVE:  The patient was brought to the [facility].  Identity was confirmed.  All relevant records and images related to the planned course of therapy were reviewed.  The patient freely provided informed written consent to proceed with treatment after reviewing the details related to the planned course of therapy. The consent form was witnessed and verified by the simulation staff.    Then, the patient was set-up in a stable reproducible  supine position for radiation therapy.  CT images were obtained.  Surface markings were placed.  The CT images were loaded into the planning software.    TREATMENT PLANNING NOTE: Treatment planning then occurred.  The radiation prescription was entered and confirmed.    A total of 1 medically necessary complex treatment devices were fabricated and supervised by me - aquaplast head mask. I have requested : Intensity Modulated Radiotherapy (IMRT) is medically necessary for this case for the following reason:  Critical CNS structure avoidance - brainstem, optic chiasm, optic nerve.  The patient will receive 59.4 Gy in 33 fractions to her two superior meningiomas with margin.   -----------------------------------  Eppie Gibson, MD

## 2015-05-26 NOTE — Progress Notes (Signed)
Right forearm IV removed intact.  Pressure and bandaid applied.

## 2015-05-26 NOTE — Progress Notes (Signed)
Meghan Yu here for IV start.  She is not taking metformin and is not allergic to IV contrast dye.  Her BUN was 22.1 and CR was 0.8 from labs drawn today.  IV attempted in left AC and right wrist.  IV started in left forearm by Joaquim Lai, RN.  Blood return noted and flushed with 10 cc normal saline.  Escorted to CT SIM by Nira Conn, RT.

## 2015-05-31 DIAGNOSIS — Z510 Encounter for antineoplastic radiation therapy: Secondary | ICD-10-CM | POA: Diagnosis not present

## 2015-06-02 DIAGNOSIS — Z510 Encounter for antineoplastic radiation therapy: Secondary | ICD-10-CM | POA: Diagnosis not present

## 2015-06-06 ENCOUNTER — Ambulatory Visit
Admission: RE | Admit: 2015-06-06 | Discharge: 2015-06-06 | Disposition: A | Discharge status: Home / Self Care | Payer: Medicare Other | Source: Ambulatory Visit | Attending: Radiation Oncology | Admitting: Radiation Oncology

## 2015-06-06 DIAGNOSIS — Z510 Encounter for antineoplastic radiation therapy: Secondary | ICD-10-CM | POA: Diagnosis not present

## 2015-06-07 ENCOUNTER — Ambulatory Visit
Admission: RE | Admit: 2015-06-07 | Discharge: 2015-06-07 | Disposition: A | Discharge status: Home / Self Care | Payer: Medicare Other | Source: Ambulatory Visit | Attending: Radiation Oncology | Admitting: Radiation Oncology

## 2015-06-07 DIAGNOSIS — Z510 Encounter for antineoplastic radiation therapy: Secondary | ICD-10-CM | POA: Diagnosis not present

## 2015-06-08 ENCOUNTER — Ambulatory Visit
Admission: RE | Admit: 2015-06-08 | Discharge: 2015-06-08 | Disposition: A | Discharge status: Home / Self Care | Payer: Medicare Other | Source: Ambulatory Visit | Attending: Radiation Oncology | Admitting: Radiation Oncology

## 2015-06-08 DIAGNOSIS — Z510 Encounter for antineoplastic radiation therapy: Secondary | ICD-10-CM | POA: Diagnosis not present

## 2015-06-09 ENCOUNTER — Ambulatory Visit
Admission: RE | Admit: 2015-06-09 | Discharge: 2015-06-09 | Disposition: A | Discharge status: Home / Self Care | Payer: Medicare Other | Source: Ambulatory Visit | Attending: Radiation Oncology | Admitting: Radiation Oncology

## 2015-06-09 DIAGNOSIS — Z510 Encounter for antineoplastic radiation therapy: Secondary | ICD-10-CM | POA: Diagnosis not present

## 2015-06-12 ENCOUNTER — Encounter: Payer: Self-pay | Admitting: Radiation Oncology

## 2015-06-12 ENCOUNTER — Ambulatory Visit
Admission: RE | Admit: 2015-06-12 | Discharge: 2015-06-12 | Disposition: A | Discharge status: Home / Self Care | Payer: Medicare Other | Source: Ambulatory Visit | Attending: Radiation Oncology | Admitting: Radiation Oncology

## 2015-06-12 VITALS — BP 129/66 | HR 70 | Temp 97.8°F | Resp 12 | Wt 137.6 lb

## 2015-06-12 DIAGNOSIS — Z510 Encounter for antineoplastic radiation therapy: Secondary | ICD-10-CM | POA: Diagnosis not present

## 2015-06-12 DIAGNOSIS — D420 Neoplasm of uncertain behavior of cerebral meninges: Secondary | ICD-10-CM

## 2015-06-12 NOTE — Progress Notes (Signed)
   Weekly Management Note:  outpatient    ICD-9-CM ICD-10-CM   1. Atypical meningioma of brain 225.2 D32.0     Current Dose:  10 Gy  Projected Dose: 60 Gy   Narrative:  The patient presents for routine under treatment assessment.  CBCT/MVCT images/Port film x-rays were reviewed.  The chart was checked. No new complaints except fatigue. cont'd weakness and balance issues.  Physical Findings:  Wt Readings from Last 3 Encounters:  06/12/15 137 lb 9.6 oz (62.415 kg)  05/26/15 135 lb 11.2 oz (61.553 kg)  05/12/15 137 lb 1.6 oz (62.188 kg)    weight is 137 lb 9.6 oz (62.415 kg). Her oral temperature is 97.8 F (36.6 C). Her blood pressure is 129/66 and her pulse is 70. Her respiration is 12 and oxygen saturation is 96%.  in wheelchair, no thrush  CBC    Component Value Date/Time   WBC 5.7 04/26/2013 0500   RBC 3.98 04/26/2013 0500   HGB 11.6* 04/26/2013 0500   HCT 34.3* 04/26/2013 0500   PLT 218 04/26/2013 0500   MCV 86.2 04/26/2013 0500   MCH 29.1 04/26/2013 0500   MCHC 33.8 04/26/2013 0500   RDW 13.0 04/26/2013 0500   LYMPHSABS 1.7 04/26/2013 0500   MONOABS 0.4 04/26/2013 0500   EOSABS 0.1 04/26/2013 0500   BASOSABS 0.0 04/26/2013 0500     CMP     Component Value Date/Time   NA 143 05/26/2015 1452   NA 134* 04/26/2013 0500   K 4.2 05/26/2015 1452   K 4.1 04/26/2013 0500   CL 96 04/26/2013 0500   CO2 29 05/26/2015 1452   CO2 29 04/26/2013 0500   GLUCOSE 96 05/26/2015 1452   GLUCOSE 144* 04/26/2013 0500   BUN 22.1 05/26/2015 1452   BUN 10 04/26/2013 0500   CREATININE 0.8 05/26/2015 1452   CREATININE 0.66 04/26/2013 0500   CALCIUM 9.5 05/26/2015 1452   CALCIUM 9.6 04/26/2013 0500   PROT 6.7 04/26/2013 0500   ALBUMIN 2.7* 04/26/2013 0500   AST 18 04/26/2013 0500   ALT 14 04/26/2013 0500   ALKPHOS 68 04/26/2013 0500   BILITOT 0.2* 04/26/2013 0500   GFRNONAA 82* 04/26/2013 0500   GFRAA >90 04/26/2013 0500     Impression:  The patient is tolerating  radiotherapy.   Plan:  Continue radiotherapy as planned. Refer to PT OT at home Rx for wig given.  -----------------------------------  Eppie Gibson, MD

## 2015-06-12 NOTE — Progress Notes (Signed)
PAIN: She is currently in no pain. NEURO: Pt alert & oriented x 3 with fluent speech, normal. Pt reports negative for visual blurring, double vision, eye pain, PERRLA, denies auditory changes. Pt presenting appropriate quality, quantity and organization of sentences, faint. Pt denies headaches. Reports an unsteady gait.  She drags her right side and has lost her balance.  She is in a wheelchair today.  OTHER: Pt complains of fatigue and weakness. Daughter reports she isn't as alert as she use to be.    Noted a moist mouth with a slight patchy area on the left side of tongue.  BP 129/66 mmHg  Pulse 70  Temp(Src) 97.8 F (36.6 C) (Oral)  Resp 12  Wt 137 lb 9.6 oz (62.415 kg)  SpO2 96% Wt Readings from Last 3 Encounters:  06/12/15 137 lb 9.6 oz (62.415 kg)  05/26/15 135 lb 11.2 oz (61.553 kg)  05/12/15 137 lb 1.6 oz (62.188 kg)

## 2015-06-13 ENCOUNTER — Ambulatory Visit
Admission: RE | Admit: 2015-06-13 | Discharge: 2015-06-13 | Disposition: A | Discharge status: Home / Self Care | Payer: Medicare Other | Source: Ambulatory Visit | Attending: Radiation Oncology | Admitting: Radiation Oncology

## 2015-06-13 DIAGNOSIS — Z510 Encounter for antineoplastic radiation therapy: Secondary | ICD-10-CM | POA: Diagnosis not present

## 2015-06-14 ENCOUNTER — Ambulatory Visit
Admission: RE | Admit: 2015-06-14 | Discharge: 2015-06-14 | Disposition: A | Discharge status: Home / Self Care | Payer: Medicare Other | Source: Ambulatory Visit | Attending: Radiation Oncology | Admitting: Radiation Oncology

## 2015-06-14 DIAGNOSIS — Z510 Encounter for antineoplastic radiation therapy: Secondary | ICD-10-CM | POA: Diagnosis not present

## 2015-06-15 ENCOUNTER — Ambulatory Visit
Admission: RE | Admit: 2015-06-15 | Discharge: 2015-06-15 | Disposition: A | Discharge status: Home / Self Care | Payer: Medicare Other | Source: Ambulatory Visit | Attending: Radiation Oncology | Admitting: Radiation Oncology

## 2015-06-15 DIAGNOSIS — Z510 Encounter for antineoplastic radiation therapy: Secondary | ICD-10-CM | POA: Diagnosis not present

## 2015-06-16 ENCOUNTER — Ambulatory Visit
Admission: RE | Admit: 2015-06-16 | Discharge: 2015-06-16 | Disposition: A | Discharge status: Home / Self Care | Payer: Medicare Other | Source: Ambulatory Visit | Attending: Radiation Oncology | Admitting: Radiation Oncology

## 2015-06-16 DIAGNOSIS — Z510 Encounter for antineoplastic radiation therapy: Secondary | ICD-10-CM | POA: Diagnosis not present

## 2015-06-19 ENCOUNTER — Ambulatory Visit
Admission: RE | Admit: 2015-06-19 | Discharge: 2015-06-19 | Disposition: A | Discharge status: Home / Self Care | Payer: Medicare Other | Source: Ambulatory Visit | Attending: Radiation Oncology | Admitting: Radiation Oncology

## 2015-06-19 VITALS — BP 132/85 | HR 66 | Resp 16 | Wt 139.2 lb

## 2015-06-19 DIAGNOSIS — D420 Neoplasm of uncertain behavior of cerebral meninges: Secondary | ICD-10-CM

## 2015-06-19 DIAGNOSIS — Z510 Encounter for antineoplastic radiation therapy: Secondary | ICD-10-CM | POA: Diagnosis not present

## 2015-06-19 NOTE — Progress Notes (Signed)
   Weekly Management Note:  outpatient    ICD-9-CM ICD-10-CM   1. Atypical meningioma of brain 225.2 D32.0     Current Dose:  20 Gy  Projected Dose: 60 Gy   Narrative:  The patient presents for routine under treatment assessment.  CBCT/MVCT images/Port film x-rays were reviewed.  The chart was checked. No new complaints except more fatigue.    Physical Findings:  Wt Readings from Last 3 Encounters:  06/19/15 139 lb 3.2 oz (63.141 kg)  06/12/15 137 lb 9.6 oz (62.415 kg)  05/26/15 135 lb 11.2 oz (61.553 kg)    weight is 139 lb 3.2 oz (63.141 kg). Her blood pressure is 132/85 and her pulse is 66. Her respiration is 16.  in wheelchair, no thrush, no scalp irritation  CBC    Component Value Date/Time   WBC 5.7 04/26/2013 0500   RBC 3.98 04/26/2013 0500   HGB 11.6* 04/26/2013 0500   HCT 34.3* 04/26/2013 0500   PLT 218 04/26/2013 0500   MCV 86.2 04/26/2013 0500   MCH 29.1 04/26/2013 0500   MCHC 33.8 04/26/2013 0500   RDW 13.0 04/26/2013 0500   LYMPHSABS 1.7 04/26/2013 0500   MONOABS 0.4 04/26/2013 0500   EOSABS 0.1 04/26/2013 0500   BASOSABS 0.0 04/26/2013 0500     CMP     Component Value Date/Time   NA 143 05/26/2015 1452   NA 134* 04/26/2013 0500   K 4.2 05/26/2015 1452   K 4.1 04/26/2013 0500   CL 96 04/26/2013 0500   CO2 29 05/26/2015 1452   CO2 29 04/26/2013 0500   GLUCOSE 96 05/26/2015 1452   GLUCOSE 144* 04/26/2013 0500   BUN 22.1 05/26/2015 1452   BUN 10 04/26/2013 0500   CREATININE 0.8 05/26/2015 1452   CREATININE 0.66 04/26/2013 0500   CALCIUM 9.5 05/26/2015 1452   CALCIUM 9.6 04/26/2013 0500   PROT 6.7 04/26/2013 0500   ALBUMIN 2.7* 04/26/2013 0500   AST 18 04/26/2013 0500   ALT 14 04/26/2013 0500   ALKPHOS 68 04/26/2013 0500   BILITOT 0.2* 04/26/2013 0500   GFRNONAA 82* 04/26/2013 0500   GFRAA >90 04/26/2013 0500     Impression:  The patient is tolerating radiotherapy with fatigue.   Plan: discussed with patient and daughter pros/ cons of  continuing; they want to continue RT. Continue radiotherapy as planned.  -----------------------------------  Eppie Gibson, MD

## 2015-06-19 NOTE — Progress Notes (Addendum)
Weight and vitals stable. Denies pain. Reports fatigue and weakness. Denies headache, dizziness, nausea, vomiting, diplopia or ringing in the ears. Reports an occasional unsteady gait. Daughter reports that she has noted a decline of her mother's short term memory.  BP 132/85 mmHg  Pulse 66  Resp 16  Wt 139 lb 3.2 oz (63.141 kg) Wt Readings from Last 3 Encounters:  06/19/15 139 lb 3.2 oz (63.141 kg)  06/12/15 137 lb 9.6 oz (62.415 kg)  05/26/15 135 lb 11.2 oz (61.553 kg)

## 2015-06-20 ENCOUNTER — Ambulatory Visit
Admission: RE | Admit: 2015-06-20 | Discharge: 2015-06-20 | Disposition: A | Discharge status: Home / Self Care | Payer: Medicare Other | Source: Ambulatory Visit | Attending: Radiation Oncology | Admitting: Radiation Oncology

## 2015-06-20 DIAGNOSIS — Z510 Encounter for antineoplastic radiation therapy: Secondary | ICD-10-CM | POA: Diagnosis not present

## 2015-06-21 ENCOUNTER — Ambulatory Visit
Admission: RE | Admit: 2015-06-21 | Discharge: 2015-06-21 | Disposition: A | Discharge status: Home / Self Care | Payer: Medicare Other | Source: Ambulatory Visit | Attending: Radiation Oncology | Admitting: Radiation Oncology

## 2015-06-21 ENCOUNTER — Encounter: Payer: Self-pay | Admitting: Radiation Oncology

## 2015-06-21 VITALS — BP 150/46 | HR 63 | Temp 98.0°F | Ht 59.0 in | Wt 138.2 lb

## 2015-06-21 DIAGNOSIS — Z510 Encounter for antineoplastic radiation therapy: Secondary | ICD-10-CM | POA: Diagnosis not present

## 2015-06-21 DIAGNOSIS — D420 Neoplasm of uncertain behavior of cerebral meninges: Secondary | ICD-10-CM

## 2015-06-21 NOTE — Progress Notes (Signed)
12 fractions to her brain. She denies any headaches nor vision changes.  Gait slightly unsteady, but unchanged since starting treatment. Looses balance if she turns around too fast.  Reports tenderness of scalp and note mild redness at this time.  Using baby shampoo, but no other products in her hair.   VSS. BP 150/46 mmHg  Pulse 63  Temp(Src) 98 F (36.7 C)  Ht 4\' 11"  (1.499 m)  Wt 138 lb 3.2 oz (62.687 kg)  BMI 27.90 kg/m2    Wt Readings from Last 3 Encounters:  06/21/15 138 lb 3.2 oz (62.687 kg)  06/19/15 139 lb 3.2 oz (63.141 kg)  06/12/15 137 lb 9.6 oz (62.415 kg)

## 2015-06-21 NOTE — Progress Notes (Signed)
   Weekly Management Note:  outpatient    ICD-9-CM ICD-10-CM   1. 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  CBCT/MVCT images/Port film x-rays were reviewed.  The chart was checked. No new complaints.  Physical Findings:  Wt Readings from Last 3 Encounters:  06/21/15 138 lb 3.2 oz (62.687 kg)  06/19/15 139 lb 3.2 oz (63.141 kg)  06/12/15 137 lb 9.6 oz (62.415 kg)    height is 4\' 11"  (1.499 m) and weight is 138 lb 3.2 oz (62.687 kg). Her temperature is 98 F (36.7 C). Her blood pressure is 150/46 and her pulse is 63.  in wheelchair   CBC    Component Value Date/Time   WBC 5.7 04/26/2013 0500   RBC 3.98 04/26/2013 0500   HGB 11.6* 04/26/2013 0500   HCT 34.3* 04/26/2013 0500   PLT 218 04/26/2013 0500   MCV 86.2 04/26/2013 0500   MCH 29.1 04/26/2013 0500   MCHC 33.8 04/26/2013 0500   RDW 13.0 04/26/2013 0500   LYMPHSABS 1.7 04/26/2013 0500   MONOABS 0.4 04/26/2013 0500   EOSABS 0.1 04/26/2013 0500   BASOSABS 0.0 04/26/2013 0500     CMP     Component Value Date/Time   NA 143 05/26/2015 1452   NA 134* 04/26/2013 0500   K 4.2 05/26/2015 1452   K 4.1 04/26/2013 0500   CL 96 04/26/2013 0500   CO2 29 05/26/2015 1452   CO2 29 04/26/2013 0500   GLUCOSE 96 05/26/2015 1452   GLUCOSE 144* 04/26/2013 0500   BUN 22.1 05/26/2015 1452   BUN 10 04/26/2013 0500   CREATININE 0.8 05/26/2015 1452   CREATININE 0.66 04/26/2013 0500   CALCIUM 9.5 05/26/2015 1452   CALCIUM 9.6 04/26/2013 0500   PROT 6.7 04/26/2013 0500   ALBUMIN 2.7* 04/26/2013 0500   AST 18 04/26/2013 0500   ALT 14 04/26/2013 0500   ALKPHOS 68 04/26/2013 0500   BILITOT 0.2* 04/26/2013 0500   GFRNONAA 82* 04/26/2013 0500   GFRAA >90 04/26/2013 0500     Impression:  The patient is tolerating radiotherapy with fatigue.   Plan:   Continue radiotherapy as planned.    -----------------------------------  Eppie Gibson, MD

## 2015-06-22 ENCOUNTER — Ambulatory Visit
Admission: RE | Admit: 2015-06-22 | Discharge: 2015-06-22 | Disposition: A | Discharge status: Home / Self Care | Payer: Medicare Other | Source: Ambulatory Visit | Attending: Radiation Oncology | Admitting: Radiation Oncology

## 2015-06-22 DIAGNOSIS — Z510 Encounter for antineoplastic radiation therapy: Secondary | ICD-10-CM | POA: Diagnosis not present

## 2015-06-23 ENCOUNTER — Ambulatory Visit
Admission: RE | Admit: 2015-06-23 | Discharge: 2015-06-23 | Disposition: A | Discharge status: Home / Self Care | Payer: Medicare Other | Source: Ambulatory Visit | Attending: Radiation Oncology | Admitting: Radiation Oncology

## 2015-06-23 DIAGNOSIS — Z510 Encounter for antineoplastic radiation therapy: Secondary | ICD-10-CM | POA: Diagnosis not present

## 2015-06-27 ENCOUNTER — Ambulatory Visit
Admission: RE | Admit: 2015-06-27 | Discharge: 2015-06-27 | Disposition: A | Discharge status: Home / Self Care | Payer: Medicare Other | Source: Ambulatory Visit | Attending: Radiation Oncology | Admitting: Radiation Oncology

## 2015-06-27 DIAGNOSIS — Z510 Encounter for antineoplastic radiation therapy: Secondary | ICD-10-CM | POA: Diagnosis not present

## 2015-06-28 ENCOUNTER — Ambulatory Visit
Admission: RE | Admit: 2015-06-28 | Discharge: 2015-06-28 | Disposition: A | Discharge status: Home / Self Care | Payer: Medicare Other | Source: Ambulatory Visit | Attending: Radiation Oncology | Admitting: Radiation Oncology

## 2015-06-28 DIAGNOSIS — Z510 Encounter for antineoplastic radiation therapy: Secondary | ICD-10-CM | POA: Diagnosis not present

## 2015-06-29 ENCOUNTER — Ambulatory Visit
Admission: RE | Admit: 2015-06-29 | Discharge: 2015-06-29 | Disposition: A | Discharge status: Home / Self Care | Payer: Medicare Other | Source: Ambulatory Visit | Attending: Radiation Oncology | Admitting: Radiation Oncology

## 2015-06-29 DIAGNOSIS — Z510 Encounter for antineoplastic radiation therapy: Secondary | ICD-10-CM | POA: Diagnosis not present

## 2015-06-30 ENCOUNTER — Ambulatory Visit
Admission: RE | Admit: 2015-06-30 | Discharge: 2015-06-30 | Disposition: A | Discharge status: Home / Self Care | Payer: Medicare Other | Source: Ambulatory Visit | Attending: Radiation Oncology | Admitting: Radiation Oncology

## 2015-06-30 DIAGNOSIS — Z510 Encounter for antineoplastic radiation therapy: Secondary | ICD-10-CM | POA: Diagnosis not present

## 2015-07-03 ENCOUNTER — Ambulatory Visit
Admission: RE | Admit: 2015-07-03 | Discharge: 2015-07-03 | Disposition: A | Discharge status: Home / Self Care | Payer: Medicare Other | Source: Ambulatory Visit | Attending: Radiation Oncology | Admitting: Radiation Oncology

## 2015-07-03 VITALS — BP 148/48 | HR 98 | Temp 97.9°F | Wt 138.3 lb

## 2015-07-03 DIAGNOSIS — D320 Benign neoplasm of cerebral meninges: Secondary | ICD-10-CM | POA: Insufficient documentation

## 2015-07-03 DIAGNOSIS — D420 Neoplasm of uncertain behavior of cerebral meninges: Secondary | ICD-10-CM

## 2015-07-03 DIAGNOSIS — D496 Neoplasm of unspecified behavior of brain: Secondary | ICD-10-CM | POA: Diagnosis not present

## 2015-07-03 DIAGNOSIS — Z510 Encounter for antineoplastic radiation therapy: Secondary | ICD-10-CM | POA: Diagnosis not present

## 2015-07-03 MED ORDER — BIAFINE EX EMUL
CUTANEOUS | Status: DC | PRN
  Administered 2015-07-03: 16:00:00 via TOPICAL

## 2015-07-03 NOTE — Progress Notes (Signed)
   Weekly Management Note:  outpatient    ICD-9-CM ICD-10-CM   1. Brain tumor 239.6 D49.6 topical emolient (BIAFINE) emulsion  2. 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  CBCT/MVCT images/Port film x-rays were reviewed.  The chart was checked. Scalp irritation, itchy hands  Physical Findings:  Wt Readings from Last 3 Encounters:  07/03/15 138 lb 4.8 oz (62.732 kg)  06/21/15 138 lb 3.2 oz (62.687 kg)  06/19/15 139 lb 3.2 oz (63.141 kg)    weight is 138 lb 4.8 oz (62.732 kg). Her temperature is 97.9 F (36.6 C). Her blood pressure is 148/48 and her pulse is 98. Her oxygen saturation is 97%.  in wheelchair Erythema over forehead.  Skin intact over hands, slightly erythematous   CBC    Component Value Date/Time   WBC 5.7 04/26/2013 0500   RBC 3.98 04/26/2013 0500   HGB 11.6* 04/26/2013 0500   HCT 34.3* 04/26/2013 0500   PLT 218 04/26/2013 0500   MCV 86.2 04/26/2013 0500   MCH 29.1 04/26/2013 0500   MCHC 33.8 04/26/2013 0500   RDW 13.0 04/26/2013 0500   LYMPHSABS 1.7 04/26/2013 0500   MONOABS 0.4 04/26/2013 0500   EOSABS 0.1 04/26/2013 0500   BASOSABS 0.0 04/26/2013 0500     CMP     Component Value Date/Time   NA 143 05/26/2015 1452   NA 134* 04/26/2013 0500   K 4.2 05/26/2015 1452   K 4.1 04/26/2013 0500   CL 96 04/26/2013 0500   CO2 29 05/26/2015 1452   CO2 29 04/26/2013 0500   GLUCOSE 96 05/26/2015 1452   GLUCOSE 144* 04/26/2013 0500   BUN 22.1 05/26/2015 1452   BUN 10 04/26/2013 0500   CREATININE 0.8 05/26/2015 1452   CREATININE 0.66 04/26/2013 0500   CALCIUM 9.5 05/26/2015 1452   CALCIUM 9.6 04/26/2013 0500   PROT 6.7 04/26/2013 0500   ALBUMIN 2.7* 04/26/2013 0500   AST 18 04/26/2013 0500   ALT 14 04/26/2013 0500   ALKPHOS 68 04/26/2013 0500   BILITOT 0.2* 04/26/2013 0500   GFRNONAA 82* 04/26/2013 0500   GFRAA >90 04/26/2013  0500     Impression:  The patient is tolerating radiotherapy with fatigue.   Plan:   Continue radiotherapy as planned. Biafine for irritated skin. -----------------------------------  Eppie Gibson, MD

## 2015-07-03 NOTE — Progress Notes (Signed)
Weekly assessment of radiation to brain.Completed 19 of 30 fractions.some loss of hair with mild skin discoloration, dryness and itching.Given biafine to apply twice daily.Denies headache, pain or nausea.Did have redness and itching of palm of hands which is clearing.appetite good and no problems with bowels. BP 148/48 mmHg  Pulse 98  Temp(Src) 97.9 F (36.6 C)  Wt 138 lb 4.8 oz (62.732 kg)  SpO2 97%  Wt Readings from Last 3 Encounters:  07/03/15 138 lb 4.8 oz (62.732 kg)  06/21/15 138 lb 3.2 oz (62.687 kg)  06/19/15 139 lb 3.2 oz (63.141 kg)

## 2015-07-04 ENCOUNTER — Ambulatory Visit
Admission: RE | Admit: 2015-07-04 | Discharge: 2015-07-04 | Disposition: A | Discharge status: Home / Self Care | Payer: Medicare Other | Source: Ambulatory Visit | Attending: Radiation Oncology | Admitting: Radiation Oncology

## 2015-07-04 DIAGNOSIS — Z510 Encounter for antineoplastic radiation therapy: Secondary | ICD-10-CM | POA: Diagnosis not present

## 2015-07-05 ENCOUNTER — Ambulatory Visit
Admission: RE | Admit: 2015-07-05 | Discharge: 2015-07-05 | Disposition: A | Discharge status: Home / Self Care | Payer: Medicare Other | Source: Ambulatory Visit | Attending: Radiation Oncology | Admitting: Radiation Oncology

## 2015-07-05 DIAGNOSIS — Z510 Encounter for antineoplastic radiation therapy: Secondary | ICD-10-CM | POA: Diagnosis not present

## 2015-07-06 ENCOUNTER — Ambulatory Visit
Admission: RE | Admit: 2015-07-06 | Discharge: 2015-07-06 | Disposition: A | Discharge status: Home / Self Care | Payer: Medicare Other | Source: Ambulatory Visit | Attending: Radiation Oncology | Admitting: Radiation Oncology

## 2015-07-06 DIAGNOSIS — Z510 Encounter for antineoplastic radiation therapy: Secondary | ICD-10-CM | POA: Diagnosis not present

## 2015-07-07 ENCOUNTER — Ambulatory Visit
Admission: RE | Admit: 2015-07-07 | Discharge: 2015-07-07 | Disposition: A | Discharge status: Home / Self Care | Payer: Medicare Other | Source: Ambulatory Visit | Attending: Radiation Oncology | Admitting: Radiation Oncology

## 2015-07-07 DIAGNOSIS — Z510 Encounter for antineoplastic radiation therapy: Secondary | ICD-10-CM | POA: Diagnosis not present

## 2015-07-10 ENCOUNTER — Ambulatory Visit
Admission: RE | Admit: 2015-07-10 | Discharge: 2015-07-10 | Disposition: A | Discharge status: Home / Self Care | Payer: Medicare Other | Source: Ambulatory Visit | Attending: Radiation Oncology | Admitting: Radiation Oncology

## 2015-07-10 ENCOUNTER — Encounter: Payer: Self-pay | Admitting: Radiation Oncology

## 2015-07-10 VITALS — BP 122/69 | HR 77 | Temp 98.0°F | Ht 59.0 in | Wt 136.2 lb

## 2015-07-10 DIAGNOSIS — Z510 Encounter for antineoplastic radiation therapy: Secondary | ICD-10-CM | POA: Diagnosis not present

## 2015-07-10 DIAGNOSIS — D420 Neoplasm of uncertain behavior of cerebral meninges: Secondary | ICD-10-CM

## 2015-07-10 MED ORDER — DEXAMETHASONE 4 MG PO TABS: 4.0000 mg | ORAL_TABLET | Freq: Two times a day (BID) | ORAL | Status: DC

## 2015-07-10 NOTE — Progress Notes (Signed)
Meghan Yu, here in a wheelchair. has completed 24 fractions to her brain.  She denies having pain.  Her daughter reports that she has been off balance since Thursday.  She reports she feel last night at home and was on her side trying to get up when she was found.  "She wants to sit when there is not a chair there."  She reports having a good appetite but does not like to drink fluids.  She denies having nausea and vision changes.  She is not taking decadron.  he skin on her forehead is red.  She is using biafine.  She reports fatigue.  BP 122/69 mmHg  Pulse 77  Temp(Src) 98 F (36.7 C) (Oral)  Ht 4\' 11"  (1.499 m)  Wt 136 lb 3.2 oz (61.78 kg)  BMI 27.49 kg/m2  SpO2 93%

## 2015-07-10 NOTE — Progress Notes (Signed)
   Weekly Management Note:  outpatient    ICD-9-CM ICD-10-CM   1. 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  CBCT/MVCT images/Port film x-rays were reviewed.  The chart was checked. Very tired, falling at home, gait problems. No HA or nausea  Physical Findings:  Wt Readings from Last 3 Encounters:  07/10/15 136 lb 3.2 oz (61.78 kg)  07/03/15 138 lb 4.8 oz (62.732 kg)  06/21/15 138 lb 3.2 oz (62.687 kg)    height is 4\' 11"  (1.499 m) and weight is 136 lb 3.2 oz (61.78 kg). Her oral temperature is 98 F (36.7 C). Her blood pressure is 122/69 and her pulse is 77. Her oxygen saturation is 93%.  in wheelchair Erythema over forehead.   CBC    Component Value Date/Time   WBC 5.7 04/26/2013 0500   RBC 3.98 04/26/2013 0500   HGB 11.6* 04/26/2013 0500   HCT 34.3* 04/26/2013 0500   PLT 218 04/26/2013 0500   MCV 86.2 04/26/2013 0500   MCH 29.1 04/26/2013 0500   MCHC 33.8 04/26/2013 0500   RDW 13.0 04/26/2013 0500   LYMPHSABS 1.7 04/26/2013 0500   MONOABS 0.4 04/26/2013 0500   EOSABS 0.1 04/26/2013 0500   BASOSABS 0.0 04/26/2013 0500     CMP     Component Value Date/Time   NA 143 05/26/2015 1452   NA 134* 04/26/2013 0500   K 4.2 05/26/2015 1452   K 4.1 04/26/2013 0500   CL 96 04/26/2013 0500   CO2 29 05/26/2015 1452   CO2 29 04/26/2013 0500   GLUCOSE 96 05/26/2015 1452   GLUCOSE 144* 04/26/2013 0500   BUN 22.1 05/26/2015 1452   BUN 10 04/26/2013 0500   CREATININE 0.8 05/26/2015 1452   CREATININE 0.66 04/26/2013 0500   CALCIUM 9.5 05/26/2015 1452   CALCIUM 9.6 04/26/2013 0500   PROT 6.7 04/26/2013 0500   ALBUMIN 2.7* 04/26/2013 0500   AST 18 04/26/2013 0500   ALT 14 04/26/2013 0500   ALKPHOS 68 04/26/2013 0500   BILITOT 0.2* 04/26/2013 0500   GFRNONAA 82* 04/26/2013 0500   GFRAA >90 04/26/2013 0500      Impression:  The patient is tolerating radiotherapy with difficulty  Plan:   Continue radiotherapy as planned. Dexamethasone 4mg  BID for decline in gait/fatigue. Reassess in 1 week.  Rx written to increase PT at home from once to 3 x weekly.  OT assessment complete. -----------------------------------  Eppie Gibson, MD

## 2015-07-11 ENCOUNTER — Ambulatory Visit
Admission: RE | Admit: 2015-07-11 | Discharge: 2015-07-11 | Disposition: A | Discharge status: Home / Self Care | Payer: Medicare Other | Source: Ambulatory Visit | Attending: Radiation Oncology | Admitting: Radiation Oncology

## 2015-07-11 DIAGNOSIS — Z510 Encounter for antineoplastic radiation therapy: Secondary | ICD-10-CM | POA: Diagnosis not present

## 2015-07-12 ENCOUNTER — Ambulatory Visit
Admission: RE | Admit: 2015-07-12 | Discharge: 2015-07-12 | Disposition: A | Discharge status: Home / Self Care | Payer: Medicare Other | Source: Ambulatory Visit | Attending: Radiation Oncology | Admitting: Radiation Oncology

## 2015-07-12 ENCOUNTER — Telehealth: Payer: Self-pay | Admitting: Radiation Oncology

## 2015-07-12 DIAGNOSIS — Z510 Encounter for antineoplastic radiation therapy: Secondary | ICD-10-CM | POA: Diagnosis not present

## 2015-07-12 NOTE — Telephone Encounter (Signed)
-----   Message from Eppie Gibson, MD sent at 07/11/2015  3:59 PM EDT ----- Nurses, It was brought to my attention that the patient's Sulindac medication might interact with Dexamethasone.  Can you ask her daughter to hold the Sulindac while on Dexamethasone? This is to reduce risk of gastritis or GI bleed while on steroids. If she has any concerns about that change, pls let me know. Thanks SS

## 2015-07-12 NOTE — Telephone Encounter (Signed)
Per Dr. Pearlie Oyster order phoned patient's daughter, Toniya Rozar to ensure the patient isn't taking both the Sulindac and decadron together due to potential for gastritis and GI bleed. Daughter reports she doesn't believe her mother is taking the Sulindac but, she will check to confirm. Requested Raquel Sarna, RT for L1 send the patient around following treatment with her daughter to confirm.

## 2015-07-13 ENCOUNTER — Telehealth: Payer: Self-pay | Admitting: Radiation Oncology

## 2015-07-13 ENCOUNTER — Telehealth: Payer: Self-pay | Admitting: *Deleted

## 2015-07-13 ENCOUNTER — Other Ambulatory Visit: Payer: Self-pay | Admitting: Radiation Oncology

## 2015-07-13 ENCOUNTER — Encounter: Payer: Self-pay | Admitting: Radiation Oncology

## 2015-07-13 ENCOUNTER — Ambulatory Visit
Admission: RE | Admit: 2015-07-13 | Discharge: 2015-07-13 | Disposition: A | Discharge status: Home / Self Care | Payer: Medicare Other | Source: Ambulatory Visit | Attending: Radiation Oncology | Admitting: Radiation Oncology

## 2015-07-13 DIAGNOSIS — Z510 Encounter for antineoplastic radiation therapy: Secondary | ICD-10-CM | POA: Diagnosis not present

## 2015-07-13 DIAGNOSIS — D420 Neoplasm of uncertain behavior of cerebral meninges: Secondary | ICD-10-CM

## 2015-07-13 MED ORDER — DEXAMETHASONE 4 MG PO TABS: 4.0000 mg | ORAL_TABLET | Freq: Three times a day (TID) | ORAL | Status: AC

## 2015-07-13 NOTE — Telephone Encounter (Signed)
Received message from [facility], RN of medical oncology to call patient's daughter. Patient's daughter, Dorsey Charette, reports her mother has new onset nausea and vomiting. Patient receiving active treatment for atypical meningioma of the brain. She is taking decadron 4 mg bid. She confirms her mother stopped taking the Sulindac as directed by Dr. Isidore Moos. Daughter confirms n/v is her mother's only complaint even states she has increased energy. Denies diarrhea or fever. However, does report her bp this morning was 196/93. Explained this is evaluated. Daughter confirms understanding then, goes onto explain they are scheduled to see her PCP at 48 today before her radiation treatment at 1530. Explained to the daughter the importance of seeing the PCP to address elevated bp. Also, explained this RN will reach out to Dr. Valere Dross with this matter since Dr. Isidore Moos is off today and call back with directions. Daughter expressed appreciation for return call.

## 2015-07-13 NOTE — Telephone Encounter (Addendum)
Per Dr. Valere Dross phoned patient's daughter, Kaila Devries. Instructed her to increased her mother's decadron frequency from twice a day to three times per day to manage new onset nausea and vomiting. She verbalized understanding. Also, she reports her mother was seen by her PCP today and prescribed amlodipine and compazine. Daughter reports they only have a few decadron pills left and are requesting new script with increased frequency be called to [facility] on [address].

## 2015-07-13 NOTE — Telephone Encounter (Signed)
PT.'S PHYSICIAN IS DR.SQUIRE IN [facility]. PT.'S DAUGHTER STATES HER MOTHER IS HAVING NAUSEA AND VOMITING. CALLED RADIATION ONCOLOGY, Meghan Yu, WAS TRANSFERRED TO Meghan Yu'S VOICE MAIL. A MESSAGE WAS LEFT TO CALL PT.'S DAUGHTER.

## 2015-07-13 NOTE — Telephone Encounter (Signed)
Prescription e-prescribed (and called in) for Dexamethasone 4 mg tabs, disp # 90, Take one by mouth 3 times a day. One refill.

## 2015-07-14 ENCOUNTER — Ambulatory Visit
Admission: RE | Admit: 2015-07-14 | Discharge: 2015-07-14 | Disposition: A | Discharge status: Home / Self Care | Payer: Medicare Other | Source: Ambulatory Visit | Attending: Radiation Oncology | Admitting: Radiation Oncology

## 2015-07-14 ENCOUNTER — Encounter (HOSPITAL_COMMUNITY): Payer: Self-pay | Admitting: Emergency Medicine

## 2015-07-14 ENCOUNTER — Emergency Department (HOSPITAL_COMMUNITY): Payer: Medicare Other

## 2015-07-14 ENCOUNTER — Emergency Department (HOSPITAL_COMMUNITY)
Admission: EM | Admit: 2015-07-14 | Discharge: 2015-07-14 | Disposition: A | Discharge status: Home / Self Care | Payer: Medicare Other | Attending: Emergency Medicine | Admitting: Emergency Medicine

## 2015-07-14 DIAGNOSIS — Y998 Other external cause status: Secondary | ICD-10-CM | POA: Diagnosis not present

## 2015-07-14 DIAGNOSIS — Z87440 Personal history of urinary (tract) infections: Secondary | ICD-10-CM | POA: Insufficient documentation

## 2015-07-14 DIAGNOSIS — Z510 Encounter for antineoplastic radiation therapy: Secondary | ICD-10-CM | POA: Diagnosis not present

## 2015-07-14 DIAGNOSIS — Z8679 Personal history of other diseases of the circulatory system: Secondary | ICD-10-CM | POA: Insufficient documentation

## 2015-07-14 DIAGNOSIS — S0990XA Unspecified injury of head, initial encounter: Secondary | ICD-10-CM | POA: Diagnosis present

## 2015-07-14 DIAGNOSIS — Y92003 Bedroom of unspecified non-institutional (private) residence as the place of occurrence of the external cause: Secondary | ICD-10-CM | POA: Diagnosis not present

## 2015-07-14 DIAGNOSIS — Z86010 Personal history of colonic polyps: Secondary | ICD-10-CM | POA: Insufficient documentation

## 2015-07-14 DIAGNOSIS — S030XXA Dislocation of jaw, initial encounter: Secondary | ICD-10-CM | POA: Insufficient documentation

## 2015-07-14 DIAGNOSIS — W01198A Fall on same level from slipping, tripping and stumbling with subsequent striking against other object, initial encounter: Secondary | ICD-10-CM | POA: Insufficient documentation

## 2015-07-14 DIAGNOSIS — Z85841 Personal history of malignant neoplasm of brain: Secondary | ICD-10-CM | POA: Diagnosis not present

## 2015-07-14 DIAGNOSIS — Z8619 Personal history of other infectious and parasitic diseases: Secondary | ICD-10-CM | POA: Insufficient documentation

## 2015-07-14 DIAGNOSIS — Y9389 Activity, other specified: Secondary | ICD-10-CM | POA: Diagnosis not present

## 2015-07-14 DIAGNOSIS — F419 Anxiety disorder, unspecified: Secondary | ICD-10-CM | POA: Insufficient documentation

## 2015-07-14 DIAGNOSIS — Z79899 Other long term (current) drug therapy: Secondary | ICD-10-CM | POA: Insufficient documentation

## 2015-07-14 DIAGNOSIS — S0001XA Abrasion of scalp, initial encounter: Secondary | ICD-10-CM | POA: Insufficient documentation

## 2015-07-14 DIAGNOSIS — E039 Hypothyroidism, unspecified: Secondary | ICD-10-CM | POA: Insufficient documentation

## 2015-07-14 DIAGNOSIS — W19XXXA Unspecified fall, initial encounter: Secondary | ICD-10-CM

## 2015-07-14 DIAGNOSIS — S0300XA Dislocation of jaw, unspecified side, initial encounter: Secondary | ICD-10-CM

## 2015-07-14 DIAGNOSIS — E785 Hyperlipidemia, unspecified: Secondary | ICD-10-CM | POA: Insufficient documentation

## 2015-07-14 DIAGNOSIS — M1990 Unspecified osteoarthritis, unspecified site: Secondary | ICD-10-CM | POA: Insufficient documentation

## 2015-07-14 MED ORDER — PROPOFOL 10 MG/ML IV BOLUS
INTRAVENOUS | Status: AC | PRN
  Administered 2015-07-14: 20 mg via INTRAVENOUS
  Administered 2015-07-14: 30 mg via INTRAVENOUS
  Administered 2015-07-14 (×2): 20 mg via INTRAVENOUS

## 2015-07-14 MED ORDER — PROPOFOL 10 MG/ML IV BOLUS
0.5000 mg/kg | Freq: Once | INTRAVENOUS | Status: AC
  Administered 2015-07-14: 20 mg via INTRAVENOUS
  Filled 2015-07-14: qty 1

## 2015-07-14 MED ORDER — SODIUM CHLORIDE 0.9 % IV SOLN
INTRAVENOUS | Status: AC | PRN
  Administered 2015-07-14: 500 mL/h via INTRAVENOUS

## 2015-07-14 NOTE — ED Provider Notes (Signed)
CSN: [id number]     Arrival date & time 07/14/15  1915 History   First MD Initiated Contact with Patient 07/14/15 1947     Chief Complaint  Patient presents with  . Cancer Patient   . Fall  . Head Injury     (Consider location/radiation/quality/duration/timing/severity/associated sxs/prior Treatment) Patient is a 81 y.o. female presenting with fall and head injury. The history is provided by the patient.  Fall This is a new problem. Associated symptoms include headaches. Pertinent negatives include no abdominal pain.  Head Injury Associated symptoms: headache   Associated symptoms: no neck pain    patient had a fall this morning while trying to get back into bed. She missed a bedpost fell forward. She struck the left side of her head. No loss conscious. Reportedly has had some mild confusion since then. Has a history of brain tumors and is on steroids. Is due to get radiation. Also has had trouble moving her jaw and her face will apparently drawn back. Patient was unable to eat dinner because she couldn't open her jaw enough. She was unable to put in her upper teeth cut she was unable to open her jaw enough.  Past Medical History  Diagnosis Date  . Hyperlipidemia   . Anxiety disorder   . Arthritis of knee, right     Needs replacement/Dr. Sharol Given  . Colon polyps     colonoscopy by Dr. Wynetta Emery  . Brain cancer 03/30/13    Left frontal meningioma  . Hypothyroidism     hypothyroidism  . UTI (lower urinary tract infection) 2014    HX e. coli  . Degenerative arthritis of hand     bilateral  . Sepsis   . SVT (supraventricular tachycardia) 2014   Past Surgical History  Procedure Laterality Date  . Knee arthroscopy    . Cholecystectomy    . Abdominal hysterectomy    . Craniotomy Left 03/30/2013    Procedure: LEFT FRONTAL CRANIOTOMY FOR RESECTION OF MENINGIOMA;  Surgeon: Kristeen Miss, MD;  Location: [facility];  Service: Neurosurgery;  Laterality: Left;  Left Frontal Craniotomy for  tumor excision   Family History  Problem Relation Age of Onset  . Stroke Mother   . Cirrhosis Father   . Emphysema Sister   . Rectal cancer Sister    Social History  Substance Use Topics  . Smoking status: Never Smoker   . Smokeless tobacco: None  . Alcohol Use: No   OB History    No data available     Review of Systems  Constitutional: Negative for appetite change.  HENT: Positive for dental problem.   Gastrointestinal: Negative for abdominal pain.  Musculoskeletal: Negative for joint swelling, neck pain and neck stiffness.  Skin: Negative for wound.  Neurological: Positive for headaches.  Psychiatric/Behavioral: Positive for confusion.      Allergies  Review of patient's allergies indicates no known allergies.  Home Medications   Prior to Admission medications   Medication Sig Start Date End Date Taking? Authorizing Provider  ALPRAZolam Duanne Moron) 1 MG tablet Take 1 tablet (1 mg total) by mouth 2 (two) times daily as needed for sleep or anxiety. 04/25/13  Yes Wenda Low, MD  amLODipine (NORVASC) 5 MG tablet Take 5 mg by mouth daily. 07/13/15  Yes Historical Provider, MD  calcium carbonate (OS-CAL) 600 MG TABS Take 600 mg by mouth 2 (two) times daily with a meal.   Yes Historical Provider, MD  cetirizine (ZYRTEC) 10 MG tablet Take 10 mg  by mouth daily.   Yes Historical Provider, MD  Cholecalciferol (VITAMIN D) 2000 UNITS CAPS Take 2,000 Units by mouth daily.    Yes Historical Provider, MD  citalopram (CELEXA) 10 MG tablet Take 10 mg by mouth daily.  05/16/15  Yes Historical Provider, MD  dexamethasone (DECADRON) 4 MG tablet Take 1 tablet (4 mg total) by mouth 3 (three) times daily. 07/13/15  Yes Arloa Koh, MD  ibuprofen (ADVIL,MOTRIN) 200 MG tablet Take 400 mg by mouth every 6 (six) hours as needed for headache, mild pain or moderate pain.   Yes Historical Provider, MD  levothyroxine (SYNTHROID, LEVOTHROID) 75 MCG tablet Take 1 tablet (75 mcg total) by mouth daily before  breakfast. 04/15/13  Yes Daniel J Angiulli, PA-C  metoprolol (LOPRESSOR) 50 MG tablet Take 1 tablet (50 mg total) by mouth every 12 (twelve) hours. Patient taking differently: Take 50 mg by mouth daily.  04/26/13  Yes Wenda Low, MD  omeprazole (PRILOSEC) 40 MG capsule Take 40 mg by mouth daily.  05/16/15  Yes Historical Provider, MD  prochlorperazine (COMPAZINE) 10 MG tablet Take 10 mg by mouth every 6 (six) hours as needed for nausea or vomiting.   Yes Historical Provider, MD  simvastatin (ZOCOR) 20 MG tablet Take 1 tablet (20 mg total) by mouth every evening. 04/15/13  Yes Lavon Paganini Angiulli, PA-C  ciprofloxacin (CIPRO) 500 MG tablet Take 1 tablet (500 mg total) by mouth 2 (two) times daily. Patient not taking: Reported on 05/12/2015 04/26/13   Wenda Low, MD  levETIRAcetam (KEPPRA) 500 MG tablet Take 1 tablet (500 mg total) by mouth 2 (two) times daily. Patient not taking: Reported on 07/14/2015 04/15/13   Lavon Paganini Angiulli, PA-C  loratadine (CLARITIN) 10 MG tablet Take 1 tablet (10 mg total) by mouth daily. Patient not taking: Reported on 05/26/2015 04/25/13   Wenda Low, MD  magnesium chloride (SLOW-MAG) 64 MG TBEC Take 1 tablet (64 mg total) by mouth daily. Patient not taking: Reported on 05/12/2015 04/15/13   Lavon Paganini Angiulli, PA-C  Multiple Vitamin (MULTIVITAMIN WITH MINERALS) TABS Take 1 tablet by mouth daily. Patient not taking: Reported on 07/14/2015 04/15/13   Lavon Paganini Angiulli, PA-C  Omega-3 Fatty Acids (FISH OIL) 1000 MG CAPS Take 1-2 capsules (1,000-2,000 mg total) by mouth 2 (two) times daily. Takes 2000 mg in the morning and 1000 mg in the evening Patient not taking: Reported on 07/14/2015 04/15/13   Lavon Paganini Angiulli, PA-C  senna-docusate (SENOKOT-S) 8.6-50 MG per tablet Take 1 tablet by mouth at bedtime. Patient not taking: Reported on 07/14/2015 04/01/13   Wenda Low, MD  sulindac (CLINORIL) 200 MG tablet Take 200 mg by mouth daily.  03/01/15   Historical Provider, MD   BP 164/77 mmHg   Pulse 73  Temp(Src) 98.6 F (37 C) (Oral)  Resp 19  SpO2 96% Physical Exam  Constitutional: She appears well-developed.  HENT:  Patient has lost most her hair. Abrasion to left parietal temporal area. No step-off or deformity. Patient is unable to open her jaw much. Her teeth on the lower jaw are not aligned with the midline of her maxilla. Unable to open the jaw more than a couple centimeters.  Eyes: EOM are normal.  Neck: Neck supple.  Cardiovascular: Normal rate.   Pulmonary/Chest: Effort normal.  Abdominal: Soft.  Musculoskeletal: Normal range of motion.  Neurological: She is alert.  Skin: Skin is warm.    ED Course  ORTHOPEDIC INJURY TREATMENT Date/Time: 07/14/2015 7:00 PM Performed by: Davonna Belling  Authorized by: Davonna Belling Consent: Verbal consent obtained. Written consent obtained. Risks and benefits: risks, benefits and alternatives were discussed Consent given by: patient Patient understanding: patient states understanding of the procedure being performed Patient consent: the patient's understanding of the procedure matches consent given Procedure consent: procedure consent matches procedure scheduled Required items: required blood products, implants, devices, and special equipment available Patient identity confirmed: verbally with patient and arm band Time out: Immediately prior to procedure a "time out" was called to verify the correct patient, procedure, equipment, support staff and site/side marked as required. Injury location: jaw Injury type: dislocation Jaw dislocation chronicity: new Pre-procedure neurovascular assessment: neurovascularly intact Pre-procedure neurological function: normal Pre-procedure range of motion: reduced Local anesthesia used: no Patient sedated: yes Sedation type: moderate (conscious) sedation Sedatives: propofol Vitals: Vital signs were monitored during sedation. (15 minutes of sedation) Manipulation performed:  yes Reduction method: downward posterior pressure Reduction successful: yes Post-procedure range of motion: improved Patient tolerance: Patient tolerated the procedure well with no immediate complications   (including critical care time) Labs Review Labs Reviewed - No data to display  Imaging Review Ct Head Wo Contrast  07/14/2015   ADDENDUM REPORT: 07/14/2015 20:40 ADDENDUM: Further information from the referring clinician that the patient cannot fully open her mouth. Further evaluation of the mandible shows some anterior subluxation of the mandibular and condyles with respect to the TMJ joint although no true dislocation is noted. These findings were discussed with Dr. Alvino Chapel. Electronically Signed   By: Inez Catalina M.D.   On: 07/14/2015 20:40  07/14/2015   CLINICAL DATA:  Fall today secondary to gait difficulties, known brain tumor  EXAM: CT HEAD WITHOUT CONTRAST  CT MAXILLOFACIAL WITHOUT CONTRAST  TECHNIQUE: Multidetector CT imaging of the head and maxillofacial structures were performed using the standard protocol without intravenous contrast. Multiplanar CT image reconstructions of the maxillofacial structures were also generated.  COMPARISON:  04/16/2015  FINDINGS: CT HEAD FINDINGS  The bony calvarium shows postsurgical changes on the left similar to that seen on the prior exam. Calcified meningioma is again noted in the right temporal region stable in appearance. Postsurgical changes are noted in the left frontal lobe. The patient's known falx meningioma E centric to the right is again seen and stable. The enhancing abnormality in the left frontal region is not as well appreciated on this exam as on the recent MRI. No findings to suggest acute hemorrhage or acute infarction are noted.  CT MAXILLOFACIAL FINDINGS  Bony structures are within normal limits. No acute fracture is seen. The paranasal sinuses and mastoid air cells are well aerated. The visualized portions of the cervical spine  demonstrate mild degenerative change. Small mucosal retention cyst is noted within the left maxillary antrum. Soft tissue structures show no acute abnormality. The orbits and their contents are unremarkable.  IMPRESSION: CT of the head: Postoperative changes.  Stable meningiomas as described.  CT of maxillofacial bones: No acute fracture noted. No significant soft tissue abnormality is noted.  Electronically Signed: By: Inez Catalina M.D. On: 07/14/2015 20:27   Ct Maxillofacial Wo Cm  07/14/2015   ADDENDUM REPORT: 07/14/2015 20:40 ADDENDUM: Further information from the referring clinician that the patient cannot fully open her mouth. Further evaluation of the mandible shows some anterior subluxation of the mandibular and condyles with respect to the TMJ joint although no true dislocation is noted. These findings were discussed with Dr. Alvino Chapel. Electronically Signed   By: Inez Catalina M.D.   On: 07/14/2015 20:40  07/14/2015   CLINICAL DATA:  Fall today secondary to gait difficulties, known brain tumor  EXAM: CT HEAD WITHOUT CONTRAST  CT MAXILLOFACIAL WITHOUT CONTRAST  TECHNIQUE: Multidetector CT imaging of the head and maxillofacial structures were performed using the standard protocol without intravenous contrast. Multiplanar CT image reconstructions of the maxillofacial structures were also generated.  COMPARISON:  04/16/2015  FINDINGS: CT HEAD FINDINGS  The bony calvarium shows postsurgical changes on the left similar to that seen on the prior exam. Calcified meningioma is again noted in the right temporal region stable in appearance. Postsurgical changes are noted in the left frontal lobe. The patient's known falx meningioma E centric to the right is again seen and stable. The enhancing abnormality in the left frontal region is not as well appreciated on this exam as on the recent MRI. No findings to suggest acute hemorrhage or acute infarction are noted.  CT MAXILLOFACIAL FINDINGS  Bony structures are  within normal limits. No acute fracture is seen. The paranasal sinuses and mastoid air cells are well aerated. The visualized portions of the cervical spine demonstrate mild degenerative change. Small mucosal retention cyst is noted within the left maxillary antrum. Soft tissue structures show no acute abnormality. The orbits and their contents are unremarkable.  IMPRESSION: CT of the head: Postoperative changes.  Stable meningiomas as described.  CT of maxillofacial bones: No acute fracture noted. No significant soft tissue abnormality is noted.  Electronically Signed: By: Inez Catalina M.D. On: 07/14/2015 20:27   I have personally reviewed and evaluated these images and lab results as part of my medical decision-making.   EKG Interpretation None      MDM   Final diagnoses:  Fall, initial encounter  Closed subluxation of jaw, initial encounter    Patient with fall. Head CT reassuring but does have apparent subluxation of the jaw. Under sedation jaw was reduced. Able to open mouth more and patient be discharged home to follow-up with ENT as needed.      Davonna Belling, MD 07/15/15 [phone]

## 2015-07-14 NOTE — ED Notes (Addendum)
Pt was supposed to have radiation treatment for multiple brain tumors. [place] today d/t mechanical related issues according to daughter ("her feet just don't always move with her"). Has small abrasion on left side of the head. Fall was early this morning. Daughter reports since fall, patient has been trembling-facial tremors noted with mouth tightness. No facial asymmetry noted. Equal grips. Stands with unsteadiness with one assist. A& oriented to person and situation. HTN related to steroid treatment-takes HTN medication and did have normal dose today. No other c/c.

## 2015-07-14 NOTE — Progress Notes (Signed)
Patient brought to nursing for assessment of skin.patient stumbled and hit head or headboard of bed and has small abrasion on left side.and also has small abrasion on digit on right hand.Denies pain.Informed to pick up dexamethasone which was increased to tid. BP 168/76 mmHg  Pulse 96  Temp(Src) 98.2 F (36.8 C)

## 2015-07-14 NOTE — Discharge Instructions (Signed)
Jaw Dislocation °A jaw dislocation is the displacement of the joint where the upper jaw bone (maxilla) and the lower jaw bone (mandibula) meet (temporomandibular joint). Soon after the dislocation, the jaw muscles tighten. This prevents the mouth from closing normally.  °CAUSES °A jaw dislocation usually is caused by a sudden forceful impact to the jaw. A strong punch in the jaw during a fist fight or a sports injury are examples of causes of jaw dislocation. Another cause is injury due to car or motorcycle accidents. °RISK FACTORS °Although anyone can have a jaw dislocation, some people are more at risk than others. People at increased risk for jaw dislocation include participants in contact sports. °SYMPTOMS °Symptoms of jaw dislocation can vary, depending on the severity of the dislocation. They can include: °· Feeling that your teeth are out of alignment when you bite. °· Inability to close your mouth completely. °· Drooling. °· Extreme pain, with the inability to move your jaw. °DIAGNOSIS °Your caregiver will feel your temporomandibular joints and ask you to move your jaw. Your caregiver also will feel the inside of your mouth to make sure there are no fractures or cuts (lacerations). °TREATMENT °Your caregiver will manipulate your jaw to put it back into place (reduction). If you have any jaw fractures from the dislocation, they usually will be held in place with plates and screws or with wiring.  °HOME CARE INSTRUCTIONS °The following measures can help to reduce pain and hasten the healing process: °· Rest your injured joint. Do not move it. Avoid activities similar to the one that caused your injury. °· Apply ice to your injured joint for 1 to 2 days after your reduction or as directed by your caregiver. Applying ice helps to reduce inflammation and pain. °¨ Put ice in a plastic bag. °¨ Place a towel between your skin and the bag. °¨ Leave the ice on for 15-20 minutes at a time, 03-04 times a day. °· Take  over-the-counter or prescription medication for pain as directed by your caregiver. °Also, your caregiver may instruct you to only have certain foods until your jaw heals. These foods may be soft or liquified so that your jaw does not have to move much to eat them. °SEEK IMMEDIATE MEDICAL CARE IF: °· You have plates and screws or wiring to hold your jaw together that becomes loose or damaged. °· You develop drainage from any of the cuts (incisions) where your wires or plates and screws were placed. °· Your pain becomes worse rather than better. °MAKE SURE YOU: °· Understand these instructions. °· Will watch your condition. °· Will get help right away if you are not doing well or get worse. °Document Released: 10/04/2000 Document Revised: 12/30/2011 Document Reviewed: 03/07/2011 °[employer]® Patient Information ©2015 [employer]. This information is not intended to replace advice given to you by your health care provider. Make sure you discuss any questions you have with your health care provider. ° °

## 2015-07-17 ENCOUNTER — Ambulatory Visit
Admission: RE | Admit: 2015-07-17 | Discharge: 2015-07-17 | Disposition: A | Discharge status: Home / Self Care | Payer: Medicare Other | Source: Ambulatory Visit | Attending: Radiation Oncology | Admitting: Radiation Oncology

## 2015-07-17 ENCOUNTER — Ambulatory Visit: Payer: Medicare Other | Admitting: Radiation Oncology

## 2015-07-17 VITALS — BP 161/91 | HR 62 | Temp 98.0°F

## 2015-07-17 DIAGNOSIS — Z510 Encounter for antineoplastic radiation therapy: Secondary | ICD-10-CM | POA: Diagnosis not present

## 2015-07-17 DIAGNOSIS — D420 Neoplasm of uncertain behavior of cerebral meninges: Secondary | ICD-10-CM

## 2015-07-17 NOTE — Progress Notes (Signed)
   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  CBCT/MVCT images/Port film x-rays were reviewed.  The chart was checked. Decreased taste, decreased po intake. Taking decadron 4mg  TID.  Physical Findings:  Wt Readings from Last 3 Encounters:  07/10/15 136 lb 3.2 oz (61.78 kg)  07/03/15 138 lb 4.8 oz (62.732 kg)  06/21/15 138 lb 3.2 oz (62.687 kg)    temperature is 98 F (36.7 C). Her blood pressure is 161/91 and her pulse is 62. Her oxygen saturation is 95%.  NAD, no thrush  CBC    Component Value Date/Time   WBC 5.7 04/26/2013 0500   RBC 3.98 04/26/2013 0500   HGB 11.6* 04/26/2013 0500   HCT 34.3* 04/26/2013 0500   PLT 218 04/26/2013 0500   MCV 86.2 04/26/2013 0500   MCH 29.1 04/26/2013 0500   MCHC 33.8 04/26/2013 0500   RDW 13.0 04/26/2013 0500   LYMPHSABS 1.7 04/26/2013 0500   MONOABS 0.4 04/26/2013 0500   EOSABS 0.1 04/26/2013 0500   BASOSABS 0.0 04/26/2013 0500     CMP     Component Value Date/Time   NA 143 05/26/2015 1452   NA 134* 04/26/2013 0500   K 4.2 05/26/2015 1452   K 4.1 04/26/2013 0500   CL 96 04/26/2013 0500   CO2 29 05/26/2015 1452   CO2 29 04/26/2013 0500   GLUCOSE 96 05/26/2015 1452   GLUCOSE 144* 04/26/2013 0500   BUN 22.1 05/26/2015 1452   BUN 10 04/26/2013 0500   CREATININE 0.8 05/26/2015 1452   CREATININE 0.66 04/26/2013 0500   CALCIUM 9.5 05/26/2015 1452   CALCIUM 9.6 04/26/2013 0500   PROT 6.7 04/26/2013 0500   ALBUMIN 2.7* 04/26/2013 0500   AST 18 04/26/2013 0500   ALT 14 04/26/2013 0500   ALKPHOS 68 04/26/2013 0500   BILITOT 0.2* 04/26/2013 0500   GFRNONAA 82* 04/26/2013 0500   GFRAA >90 04/26/2013 0500     Impression:  The patient is tolerating radiotherapy.    Plan:  Continue radiotherapy as planned. F/u in 2weeks. Refer to nutritionist. Discussed importance of PO intake. Check BMP tomorrow.   -----------------------------------  Eppie Gibson, MD

## 2015-07-17 NOTE — Progress Notes (Signed)
Weekly assessment of radiation to brain.Completed 29 of 30 fractions.Denies pain.left frontal scalp abrasion is healed and right mid digit is healing sustained from fall on Friday.Went to Emergency room that evening as she was unable to open mouth, it was released and patient then discharged to home.Affect much brighter.Taste buds diminished. BP 161/91 mmHg  Pulse 62  Temp(Src) 98 F (36.7 C)  SpO2 95%

## 2015-07-18 ENCOUNTER — Encounter: Payer: Self-pay | Admitting: Radiation Oncology

## 2015-07-18 ENCOUNTER — Ambulatory Visit: Payer: Medicare Other

## 2015-07-18 ENCOUNTER — Ambulatory Visit
Admission: RE | Admit: 2015-07-18 | Discharge: 2015-07-18 | Disposition: A | Discharge status: Home / Self Care | Payer: Medicare Other | Source: Ambulatory Visit | Attending: Radiation Oncology | Admitting: Radiation Oncology

## 2015-07-18 ENCOUNTER — Telehealth: Payer: Self-pay | Admitting: *Deleted

## 2015-07-18 VITALS — BP 141/62 | HR 63 | Resp 16 | Wt 136.0 lb

## 2015-07-18 DIAGNOSIS — Z510 Encounter for antineoplastic radiation therapy: Secondary | ICD-10-CM | POA: Diagnosis not present

## 2015-07-18 DIAGNOSIS — D320 Benign neoplasm of cerebral meninges: Secondary | ICD-10-CM | POA: Diagnosis present

## 2015-07-18 DIAGNOSIS — D420 Neoplasm of uncertain behavior of cerebral meninges: Secondary | ICD-10-CM

## 2015-07-18 LAB — BASIC METABOLIC PANEL (CC13)
ANION GAP: 11 meq/L (ref 3–11)
BUN: 42.3 mg/dL — ABNORMAL HIGH (ref 7.0–26.0)
CALCIUM: 9 mg/dL (ref 8.4–10.4)
CO2: 23 mEq/L (ref 22–29)
Chloride: 101 mEq/L (ref 98–109)
Creatinine: 0.8 mg/dL (ref 0.6–1.1)
EGFR: 66 mL/min/{1.73_m2} — AB (ref >90)
Glucose: 158 mg/dl — ABNORMAL HIGH (ref 70–140)
Potassium: 3.8 mEq/L (ref 3.5–5.1)
SODIUM: 135 meq/L — AB (ref 136–145)

## 2015-07-18 MED ORDER — FLUCONAZOLE 100 MG PO TABS: 100.0000 mg | ORAL_TABLET | Freq: Every day | ORAL | Status: DC

## 2015-07-18 MED ORDER — CLOTRIMAZOLE 10 MG MT LOZG: 10.0000 mg | LOZENGE | Freq: Every day | OROMUCOSAL | Status: DC

## 2015-07-18 NOTE — Progress Notes (Signed)
Weekly Management Note Current Dose:60 Gy  Projected Dose:60 Gy   Narrative:  The patient presents for routine under treatment assessment.  CBCT/MVCT images/Port film x-rays were reviewed.  The chart was checked. More difficulty swallowing per daughter. "hardly drinking anything". Voice is soft. Decreased energy. Daughter feels she has thrush. Cannot swallow pills.   Physical Findings:  Thrush in posterior oropharynx. Somnelent.   Vitals:  Filed Vitals:   07/18/15 1550  BP: 141/62  Pulse: 63  Resp: 16   Weight:  Wt Readings from Last 3 Encounters:  07/18/15 136 lb (61.689 kg)  07/10/15 136 lb 3.2 oz (61.78 kg)  07/03/15 138 lb 4.8 oz (62.732 kg)   Lab Results  Component Value Date   WBC 5.7 04/26/2013   HGB 11.6* 04/26/2013   HCT 34.3* 04/26/2013   MCV 86.2 04/26/2013   PLT 218 04/26/2013   Lab Results  Component Value Date   CREATININE 0.8 07/18/2015   BUN 42.3* 07/18/2015   NA 135* 07/18/2015   K 3.8 07/18/2015   CL 96 04/26/2013   CO2 23 07/18/2015     Impression:  Finishing radiation with poor po intake and thrush  Plan:  Encouraged pos. Dietician next week. Follow up with Dr. Isidore Moos in 1 weeks. Try myclelex troches and/or diflucan. May need hospitalization due to age and poor po intake, Pt declined at this time.

## 2015-07-18 NOTE — Progress Notes (Signed)
Weight and vitals stable. Denies pain. Daughter expresses concern that her mother isn't eating and drinking. Patient denies pain with swallowing but, does reports difficulty. Thrush noted. Patient taking decadron 4 mg tid. Follow up appointment card given. Patient aware of nutrition appointment.   BP 141/62 mmHg  Pulse 63  Resp 16  Wt 136 lb (61.689 kg) Wt Readings from Last 3 Encounters:  07/18/15 136 lb (61.689 kg)  07/10/15 136 lb 3.2 oz (61.78 kg)  07/03/15 138 lb 4.8 oz (62.732 kg)  '

## 2015-07-18 NOTE — Telephone Encounter (Signed)
CALLED PATIENT TO INFORM OF LAB FOR TODAY, NUTRITION APPT. AND FU, SPOKE WITH PATIENT AND SHE IS AWARE OF THESE APPTS.

## 2015-07-19 ENCOUNTER — Telehealth: Payer: Self-pay | Admitting: *Deleted

## 2015-07-19 ENCOUNTER — Encounter: Payer: Medicare Other | Admitting: Nutrition

## 2015-07-19 ENCOUNTER — Ambulatory Visit: Payer: Medicare Other

## 2015-07-19 ENCOUNTER — Ambulatory Visit (HOSPITAL_COMMUNITY)
Admission: RE | Admit: 2015-07-19 | Discharge: 2015-07-19 | Disposition: A | Discharge status: Home / Self Care | Payer: Medicare Other | Source: Ambulatory Visit | Attending: Radiation Oncology | Admitting: Radiation Oncology

## 2015-07-19 ENCOUNTER — Other Ambulatory Visit: Payer: Self-pay | Admitting: *Deleted

## 2015-07-19 ENCOUNTER — Ambulatory Visit
Admission: RE | Admit: 2015-07-19 | Discharge: 2015-07-19 | Disposition: A | Discharge status: Home / Self Care | Payer: Medicare Other | Source: Ambulatory Visit | Attending: Radiation Oncology | Admitting: Radiation Oncology

## 2015-07-19 ENCOUNTER — Other Ambulatory Visit: Payer: Self-pay | Admitting: Radiation Oncology

## 2015-07-19 DIAGNOSIS — D420 Neoplasm of uncertain behavior of cerebral meninges: Secondary | ICD-10-CM

## 2015-07-19 DIAGNOSIS — D320 Benign neoplasm of cerebral meninges: Secondary | ICD-10-CM | POA: Insufficient documentation

## 2015-07-19 MED ORDER — LIDOCAINE VISCOUS 2 % MT SOLN: OROMUCOSAL | Status: AC

## 2015-07-19 MED ORDER — SODIUM CHLORIDE 0.9 % IV SOLN
INTRAVENOUS | Status: AC
  Administered 2015-07-19: 11:00:00 via INTRAVENOUS

## 2015-07-19 NOTE — Telephone Encounter (Signed)
PATIENT DECLINED NUTRITION APPT., DR. Isidore Moos AWARE OF THIS

## 2015-07-19 NOTE — Procedures (Signed)
Associated diagnosis: Atypical meningioma of brain MD: Isidore Moos  Procedure Note: Infusion 1L normal saline Condition during procedure: No complications Condition after procedure: Alert, oriented, escorted out in wheelchair with daughter

## 2015-07-19 NOTE — Telephone Encounter (Signed)
CALLED PATIENT TO INFORM OF APPT. FOR IV FLUIDS @ [facility] @ 10 AM AND HER FU WITH DR. Isidore Moos AFTER THIS APPT., SPOKE WITH DAUGHTER Sparks AGREED TO THIS

## 2015-07-19 NOTE — Progress Notes (Signed)
Radiation Oncology         [phone] ________________________________  Name: Meghan Yu MRN: [id number]  Date: 07/19/2015  DOB: 04-10-1932  Follow-Up Visit Note  Outpatient  CC: Wenda Low, MD  Kristeen Miss, MD  Diagnosis and Prior Radiotherapy:    ICD-9-CM ICD-10-CM   1. 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  Narrative:  The patient returns today for routine follow-up.  She continues to have odynophagia, but is taking thrush meds as Rx'd yesterday by Dr Pablo Ledger.  She has dysgeusia and poor po intake.  She and her daughter want to avoid hospitalization.                                ALLERGIES:  has No Known Allergies.  Meds: Current Outpatient Prescriptions  Medication Sig Dispense Refill  . ALPRAZolam (XANAX) 1 MG tablet Take 1 tablet (1 mg total) by mouth 2 (two) times daily as needed for sleep or anxiety. 30 tablet 0  . amLODipine (NORVASC) 5 MG tablet Take 5 mg by mouth daily.    . calcium carbonate (OS-CAL) 600 MG TABS Take 600 mg by mouth 2 (two) times daily with a meal.    . cetirizine (ZYRTEC) 10 MG tablet Take 10 mg by mouth daily.    . Cholecalciferol (VITAMIN D) 2000 UNITS CAPS Take 2,000 Units by mouth daily.     . ciprofloxacin (CIPRO) 500 MG tablet Take 1 tablet (500 mg total) by mouth 2 (two) times daily. (Patient not taking: Reported on 05/12/2015) 14 tablet 0  . citalopram (CELEXA) 10 MG tablet Take 10 mg by mouth daily.     . clotrimazole (MYCELEX) 10 MG troche Take 1 lozenge (10 mg total) by mouth 5 (five) times daily. 70 tablet 0  . dexamethasone (DECADRON) 4 MG tablet Take 1 tablet (4 mg total) by mouth 3 (three) times daily. 90 tablet 0  . fluconazole (DIFLUCAN) 100 MG tablet Take 1 tablet (100 mg total) by mouth daily. 7 tablet 0  . ibuprofen (ADVIL,MOTRIN) 200 MG tablet Take 400 mg by mouth every 6 (six) hours as needed for headache, mild pain or moderate  pain.    Marland Kitchen levETIRAcetam (KEPPRA) 500 MG tablet Take 1 tablet (500 mg total) by mouth 2 (two) times daily. (Patient not taking: Reported on 07/14/2015) 60 tablet 1  . levothyroxine (SYNTHROID, LEVOTHROID) 75 MCG tablet Take 1 tablet (75 mcg total) by mouth daily before breakfast. 30 tablet 1  . lidocaine (XYLOCAINE) 2 % solution Patient: Mix 1part 2% viscous lidocaine, 1part H20. Swish and/or swallow 79mL of this mixture, 39min before meals and at bedtime, up to QID 100 mL 5  . loratadine (CLARITIN) 10 MG tablet Take 1 tablet (10 mg total) by mouth daily. (Patient not taking: Reported on 05/26/2015) 30 tablet 0  . magnesium chloride (SLOW-MAG) 64 MG TBEC Take 1 tablet (64 mg total) by mouth daily. (Patient not taking: Reported on 05/12/2015) 60 tablet 1  . metoprolol (LOPRESSOR) 50 MG tablet Take 1 tablet (50 mg total) by mouth every 12 (twelve) hours. (Patient taking differently: Take 50 mg by mouth daily. ) 60 tablet 6  . Multiple Vitamin (MULTIVITAMIN WITH MINERALS) TABS Take 1 tablet by mouth daily.    . Omega-3 Fatty Acids (FISH OIL) 1000 MG CAPS Take 1-2 capsules (1,000-2,000 mg total) by mouth 2 (two) times daily. Takes  2000 mg in the morning and 1000 mg in the evening 60 capsule 0  . omeprazole (PRILOSEC) 40 MG capsule Take 40 mg by mouth daily.     . prochlorperazine (COMPAZINE) 10 MG tablet Take 10 mg by mouth every 6 (six) hours as needed for nausea or vomiting.    . senna-docusate (SENOKOT-S) 8.6-50 MG per tablet Take 1 tablet by mouth at bedtime. 2 tablet 0  . simvastatin (ZOCOR) 20 MG tablet Take 1 tablet (20 mg total) by mouth every evening. 30 tablet 1  . sulindac (CLINORIL) 200 MG tablet Take 200 mg by mouth daily.      No current facility-administered medications for this encounter.    Physical Findings: The patient is in no acute distress.     vitals were not taken for this visit.Ritta Slot over tongue.  Sitting in wheelchair.   Lab Findings: Lab Results  Component Value Date     WBC 5.7 04/26/2013   HGB 11.6* 04/26/2013   HCT 34.3* 04/26/2013   MCV 86.2 04/26/2013   PLT 218 04/26/2013   CMP     Component Value Date/Time   NA 135* 07/18/2015 1506   NA 134* 04/26/2013 0500   K 3.8 07/18/2015 1506   K 4.1 04/26/2013 0500   CL 96 04/26/2013 0500   CO2 23 07/18/2015 1506   CO2 29 04/26/2013 0500   GLUCOSE 158* 07/18/2015 1506   GLUCOSE 144* 04/26/2013 0500   BUN 42.3* 07/18/2015 1506   BUN 10 04/26/2013 0500   CREATININE 0.8 07/18/2015 1506   CREATININE 0.66 04/26/2013 0500   CALCIUM 9.0 07/18/2015 1506   CALCIUM 9.6 04/26/2013 0500   PROT 6.7 04/26/2013 0500   ALBUMIN 2.7* 04/26/2013 0500   AST 18 04/26/2013 0500   ALT 14 04/26/2013 0500   ALKPHOS 68 04/26/2013 0500   BILITOT 0.2* 04/26/2013 0500   GFRNONAA 82* 04/26/2013 0500   GFRAA >90 04/26/2013 0500     Radiographic Findings: Ct Head Wo Contrast  07/14/2015   ADDENDUM REPORT: 07/14/2015 20:40 ADDENDUM: Further information from the referring clinician that the patient cannot fully open her mouth. Further evaluation of the mandible shows some anterior subluxation of the mandibular and condyles with respect to the TMJ joint although no true dislocation is noted. These findings were discussed with Dr. Alvino Chapel. Electronically Signed   By: Inez Catalina M.D.   On: 07/14/2015 20:40  07/14/2015   CLINICAL DATA:  Fall today secondary to gait difficulties, known brain tumor  EXAM: CT HEAD WITHOUT CONTRAST  CT MAXILLOFACIAL WITHOUT CONTRAST  TECHNIQUE: Multidetector CT imaging of the head and maxillofacial structures were performed using the standard protocol without intravenous contrast. Multiplanar CT image reconstructions of the maxillofacial structures were also generated.  COMPARISON:  04/16/2015  FINDINGS: CT HEAD FINDINGS  The bony calvarium shows postsurgical changes on the left similar to that seen on the prior exam. Calcified meningioma is again noted in the right temporal region stable in  appearance. Postsurgical changes are noted in the left frontal lobe. The patient's known falx meningioma E centric to the right is again seen and stable. The enhancing abnormality in the left frontal region is not as well appreciated on this exam as on the recent MRI. No findings to suggest acute hemorrhage or acute infarction are noted.  CT MAXILLOFACIAL FINDINGS  Bony structures are within normal limits. No acute fracture is seen. The paranasal sinuses and mastoid air cells are well aerated. The visualized portions of the  cervical spine demonstrate mild degenerative change. Small mucosal retention cyst is noted within the left maxillary antrum. Soft tissue structures show no acute abnormality. The orbits and their contents are unremarkable.  IMPRESSION: CT of the head: Postoperative changes.  Stable meningiomas as described.  CT of maxillofacial bones: No acute fracture noted. No significant soft tissue abnormality is noted.  Electronically Signed: By: Inez Catalina M.D. On: 07/14/2015 20:27   Ct Maxillofacial Wo Cm  07/14/2015   ADDENDUM REPORT: 07/14/2015 20:40 ADDENDUM: Further information from the referring clinician that the patient cannot fully open her mouth. Further evaluation of the mandible shows some anterior subluxation of the mandibular and condyles with respect to the TMJ joint although no true dislocation is noted. These findings were discussed with Dr. Alvino Chapel. Electronically Signed   By: Inez Catalina M.D.   On: 07/14/2015 20:40  07/14/2015   CLINICAL DATA:  Fall today secondary to gait difficulties, known brain tumor  EXAM: CT HEAD WITHOUT CONTRAST  CT MAXILLOFACIAL WITHOUT CONTRAST  TECHNIQUE: Multidetector CT imaging of the head and maxillofacial structures were performed using the standard protocol without intravenous contrast. Multiplanar CT image reconstructions of the maxillofacial structures were also generated.  COMPARISON:  04/16/2015  FINDINGS: CT HEAD FINDINGS  The bony calvarium  shows postsurgical changes on the left similar to that seen on the prior exam. Calcified meningioma is again noted in the right temporal region stable in appearance. Postsurgical changes are noted in the left frontal lobe. The patient's known falx meningioma E centric to the right is again seen and stable. The enhancing abnormality in the left frontal region is not as well appreciated on this exam as on the recent MRI. No findings to suggest acute hemorrhage or acute infarction are noted.  CT MAXILLOFACIAL FINDINGS  Bony structures are within normal limits. No acute fracture is seen. The paranasal sinuses and mastoid air cells are well aerated. The visualized portions of the cervical spine demonstrate mild degenerative change. Small mucosal retention cyst is noted within the left maxillary antrum. Soft tissue structures show no acute abnormality. The orbits and their contents are unremarkable.  IMPRESSION: CT of the head: Postoperative changes.  Stable meningiomas as described.  CT of maxillofacial bones: No acute fracture noted. No significant soft tissue abnormality is noted.  Electronically Signed: By: Inez Catalina M.D. On: 07/14/2015 20:27    Impression/Plan: recovering from RT.  Dehydration, anorexia, thrush.  She received IV fluids this AM and will continue QOD through Monday, 10-3.  Encouraged PO intake PRN.  Lidocaine MW to swallow to soothe odynophagia. Continue thrush meds.  Patient /daughter decline IV fluids more often, despite by recommendations, due to issues including daughter's work responsibilities and pain related to needles.  Follow-up with labs in 5 days  _____________________________________   Eppie Gibson, MD

## 2015-07-20 ENCOUNTER — Other Ambulatory Visit: Payer: Self-pay | Admitting: *Deleted

## 2015-07-20 ENCOUNTER — Ambulatory Visit: Payer: Medicare Other

## 2015-07-21 ENCOUNTER — Encounter (HOSPITAL_COMMUNITY): Payer: Self-pay | Admitting: *Deleted

## 2015-07-21 ENCOUNTER — Inpatient Hospital Stay (HOSPITAL_COMMUNITY): Admission: RE | Admit: 2015-07-21 | Payer: Medicare Other | Source: Ambulatory Visit

## 2015-07-21 ENCOUNTER — Emergency Department (HOSPITAL_COMMUNITY): Payer: Medicare Other

## 2015-07-21 ENCOUNTER — Emergency Department (HOSPITAL_COMMUNITY)
Admission: EM | Admit: 2015-07-21 | Discharge: 2015-07-21 | Disposition: A | Discharge status: Home / Self Care | Payer: Medicare Other | Source: Home / Self Care | Attending: Emergency Medicine | Admitting: Emergency Medicine

## 2015-07-21 ENCOUNTER — Ambulatory Visit: Payer: Medicare Other

## 2015-07-21 DIAGNOSIS — A419 Sepsis, unspecified organism: Secondary | ICD-10-CM | POA: Diagnosis not present

## 2015-07-21 DIAGNOSIS — J189 Pneumonia, unspecified organism: Secondary | ICD-10-CM | POA: Diagnosis not present

## 2015-07-21 DIAGNOSIS — R4702 Dysphasia: Secondary | ICD-10-CM

## 2015-07-21 DIAGNOSIS — B370 Candidal stomatitis: Secondary | ICD-10-CM

## 2015-07-21 LAB — URINALYSIS, ROUTINE W REFLEX MICROSCOPIC
BILIRUBIN URINE: NEGATIVE
Glucose, UA: NEGATIVE mg/dL
Ketones, ur: NEGATIVE mg/dL
NITRITE: NEGATIVE
PH: 6.5 (ref 5.0–8.0)
Protein, ur: NEGATIVE mg/dL
SPECIFIC GRAVITY, URINE: 1.019 (ref 1.005–1.030)
Urobilinogen, UA: 0.2 mg/dL (ref 0.0–1.0)

## 2015-07-21 LAB — CBC
HEMATOCRIT: 43.3 % (ref 36.0–46.0)
Hemoglobin: 14.9 g/dL (ref 12.0–15.0)
MCH: 28.8 pg (ref 26.0–34.0)
MCHC: 34.4 g/dL (ref 30.0–36.0)
MCV: 83.8 fL (ref 78.0–100.0)
Platelets: 156 10*3/uL (ref 150–400)
RBC: 5.17 MIL/uL — ABNORMAL HIGH (ref 3.87–5.11)
RDW: 12.8 % (ref 11.5–15.5)
WBC: 22.8 10*3/uL — ABNORMAL HIGH (ref 4.0–10.5)

## 2015-07-21 LAB — URINE MICROSCOPIC-ADD ON

## 2015-07-21 LAB — BASIC METABOLIC PANEL
Anion gap: 11 (ref 5–15)
BUN: 33 mg/dL — AB (ref 6–20)
CHLORIDE: 102 mmol/L (ref 101–111)
CO2: 22 mmol/L (ref 22–32)
Calcium: 9.2 mg/dL (ref 8.9–10.3)
Creatinine, Ser: 0.79 mg/dL (ref 0.44–1.00)
GFR calc Af Amer: >60 mL/min (ref >60)
GFR calc non Af Amer: >60 mL/min (ref >60)
GLUCOSE: 162 mg/dL — AB (ref 65–99)
POTASSIUM: 4.8 mmol/L (ref 3.5–5.1)
Sodium: 135 mmol/L (ref 135–145)

## 2015-07-21 LAB — CBG MONITORING, ED: Glucose-Capillary: 152 mg/dL — ABNORMAL HIGH (ref 65–99)

## 2015-07-21 MED ORDER — SODIUM CHLORIDE 0.9 % IV BOLUS (SEPSIS)
500.0000 mL | Freq: Once | INTRAVENOUS | Status: AC
  Administered 2015-07-21: 500 mL via INTRAVENOUS

## 2015-07-21 MED ORDER — SODIUM CHLORIDE 0.9 % IV SOLN
INTRAVENOUS | Status: DC
  Administered 2015-07-21: 16:00:00 via INTRAVENOUS

## 2015-07-21 MED ORDER — FOSFOMYCIN TROMETHAMINE 3 G PO PACK
3.0000 g | PACK | Freq: Once | ORAL | Status: AC
  Administered 2015-07-21: 3 g via ORAL
  Filled 2015-07-21: qty 3

## 2015-07-21 NOTE — ED Notes (Signed)
Pt in from home c/o difficulty swallowing onset since last Thursday, pt receives radiation tx for brain CA, pt had tx last Thurs, Friday, Monday & Tues, pt reports being treated for thrush without relief & her oncologist & PCP recommended the pt come to the ER, per pts family the pt was advised to be admitted to the hospital x 2 days ago for IV hydration, but pt refused, [facility], pt c/o lethargy, pt reports 2/10 pain with swallowing

## 2015-07-21 NOTE — ED Provider Notes (Signed)
CSN: [id number]     Arrival date & time 07/21/15  1213 History   First MD Initiated Contact with Patient 07/21/15 1531     Chief Complaint  Patient presents with  . Dysphagia     (Consider location/radiation/quality/duration/timing/severity/associated sxs/prior Treatment) The history is provided by the patient.     Meghan Yu is a 81 y.o. femaleWho is here for evaluation of swelling present for 4 days, characterized by difficulty with swallowing, but no distinct pain, from swallowing. Her family member. She has more trouble swallowing liquids and solids. She is currently getting radiation therapy of head and neck for brain cancer. She lives at home with her daughter. There's been no reported fever, chills, cough, shortness of breath, chest pain or abdominal pain. She feels somewhat weak with standing. There are no other known modifying factors.   Past Medical History  Diagnosis Date  . Hyperlipidemia   . Anxiety disorder   . Arthritis of knee, right     Needs replacement/Dr. Sharol Given  . Colon polyps     colonoscopy by Dr. Wynetta Emery  . Brain cancer 03/30/13    Left frontal meningioma  . Hypothyroidism     hypothyroidism  . UTI (lower urinary tract infection) 2014    HX e. coli  . Degenerative arthritis of hand     bilateral  . Sepsis   . SVT (supraventricular tachycardia) 2014   Past Surgical History  Procedure Laterality Date  . Knee arthroscopy    . Cholecystectomy    . Abdominal hysterectomy    . Craniotomy Left 03/30/2013    Procedure: LEFT FRONTAL CRANIOTOMY FOR RESECTION OF MENINGIOMA;  Surgeon: Kristeen Miss, MD;  Location: [facility];  Service: Neurosurgery;  Laterality: Left;  Left Frontal Craniotomy for tumor excision   Family History  Problem Relation Age of Onset  . Stroke Mother   . Cirrhosis Father   . Emphysema Sister   . Rectal cancer Sister    Social History  Substance Use Topics  . Smoking status: Never Smoker   . Smokeless tobacco: None  . Alcohol  Use: No   OB History    No data available     Review of Systems  All other systems reviewed and are negative.     Allergies  Review of patient's allergies indicates no known allergies.  Home Medications   Prior to Admission medications   Medication Sig Start Date End Date Taking? Authorizing Lamona Eimer  ALPRAZolam Duanne Moron) 1 MG tablet Take 1 tablet (1 mg total) by mouth 2 (two) times daily as needed for sleep or anxiety. 04/25/13  Yes Wenda Low, MD  amLODipine (NORVASC) 5 MG tablet Take 5 mg by mouth daily. 07/13/15  Yes Historical Teyah Rossy, MD  calcium carbonate (OS-CAL) 600 MG TABS Take 600 mg by mouth 2 (two) times daily with a meal.   Yes Historical Porcha Deblanc, MD  cetirizine (ZYRTEC) 10 MG tablet Take 10 mg by mouth daily.   Yes Historical Tosca Pletz, MD  Cholecalciferol (VITAMIN D) 2000 UNITS CAPS Take 2,000 Units by mouth daily.    Yes Historical Tunis Gentle, MD  citalopram (CELEXA) 10 MG tablet Take 10 mg by mouth daily.  05/16/15  Yes Historical Zeffie Bickert, MD  clotrimazole (MYCELEX) 10 MG troche Take 1 lozenge (10 mg total) by mouth 5 (five) times daily. 07/18/15  Yes Thea Silversmith, MD  dexamethasone (DECADRON) 4 MG tablet Take 1 tablet (4 mg total) by mouth 3 (three) times daily. 07/13/15  Yes Arloa Koh, MD  fluconazole (  DIFLUCAN) 100 MG tablet Take 1 tablet (100 mg total) by mouth daily. 07/18/15  Yes Thea Silversmith, MD  ibuprofen (ADVIL,MOTRIN) 200 MG tablet Take 400 mg by mouth every 6 (six) hours as needed for headache, mild pain or moderate pain.   Yes Historical Terren Jandreau, MD  levothyroxine (SYNTHROID, LEVOTHROID) 75 MCG tablet Take 1 tablet (75 mcg total) by mouth daily before breakfast. 04/15/13  Yes Daniel J Angiulli, PA-C  lidocaine (XYLOCAINE) 2 % solution Patient: Mix 1part 2% viscous lidocaine, 1part H20. Swish and/or swallow 57mL of this mixture, 53min before meals and at bedtime, up to QID 07/19/15  Yes Eppie Gibson, MD  metoprolol (LOPRESSOR) 50 MG tablet Take 1 tablet  (50 mg total) by mouth every 12 (twelve) hours. Patient taking differently: Take 50 mg by mouth daily.  04/26/13  Yes Wenda Low, MD  Multiple Vitamin (MULTIVITAMIN WITH MINERALS) TABS Take 1 tablet by mouth daily. 04/15/13  Yes Daniel J Angiulli, PA-C  Omega-3 Fatty Acids (FISH OIL) 1000 MG CAPS Take 1-2 capsules (1,000-2,000 mg total) by mouth 2 (two) times daily. Takes 2000 mg in the morning and 1000 mg in the evening 04/15/13  Yes Daniel J Angiulli, PA-C  omeprazole (PRILOSEC) 40 MG capsule Take 40 mg by mouth daily.  05/16/15  Yes Historical Cataleyah Colborn, MD  prochlorperazine (COMPAZINE) 10 MG tablet Take 10 mg by mouth every 6 (six) hours as needed for nausea or vomiting.   Yes Historical Suanne Minahan, MD  senna-docusate (SENOKOT-S) 8.6-50 MG per tablet Take 1 tablet by mouth at bedtime. 04/01/13  Yes Wenda Low, MD  simvastatin (ZOCOR) 20 MG tablet Take 1 tablet (20 mg total) by mouth every evening. 04/15/13  Yes Lavon Paganini Angiulli, PA-C  ciprofloxacin (CIPRO) 500 MG tablet Take 1 tablet (500 mg total) by mouth 2 (two) times daily. Patient not taking: Reported on 05/12/2015 04/26/13   Wenda Low, MD  levETIRAcetam (KEPPRA) 500 MG tablet Take 1 tablet (500 mg total) by mouth 2 (two) times daily. Patient not taking: Reported on 07/14/2015 04/15/13   Lavon Paganini Angiulli, PA-C  loratadine (CLARITIN) 10 MG tablet Take 1 tablet (10 mg total) by mouth daily. Patient not taking: Reported on 05/26/2015 04/25/13   Wenda Low, MD  magnesium chloride (SLOW-MAG) 64 MG TBEC Take 1 tablet (64 mg total) by mouth daily. Patient not taking: Reported on 05/12/2015 04/15/13   Lavon Paganini Angiulli, PA-C   BP 134/57 mmHg  Pulse 59  Temp(Src) 98 F (36.7 C) (Oral)  Resp 19  Ht 4\' 11"  (1.499 m)  Wt 127 lb 1 oz (57.635 kg)  BMI 25.65 kg/m2  SpO2 95% Physical Exam  Constitutional: She appears well-developed.  Elderly, frail. She is somewhat uncomfortable.  HENT:  Head: Normocephalic and atraumatic.  Right Ear: External ear  normal.  Left Ear: External ear normal.  Mucus members are slightly dry. There are no visible oral lesions.  Eyes: Conjunctivae and EOM are normal. Pupils are equal, round, and reactive to light.  Neck: Normal range of motion and phonation normal. Neck supple.  Cardiovascular: Normal rate, regular rhythm and normal heart sounds.   Pulmonary/Chest: Effort normal and breath sounds normal. She exhibits no bony tenderness.  Abdominal: Soft. There is no tenderness.  Musculoskeletal: Normal range of motion.  Neurological: She is alert. No cranial nerve deficit or sensory deficit. She exhibits normal muscle tone. Coordination normal.  Skin: Skin is warm, dry and intact.  Psychiatric: She has a normal mood and affect. Her behavior is normal.  Nursing note and vitals reviewed.   ED Course  Procedures (including critical care time)  Medications  sodium chloride 0.9 % bolus 500 mL (0 mLs Intravenous Stopped 07/21/15 1623)  sodium chloride 0.9 % bolus 500 mL (0 mLs Intravenous Stopped 07/21/15 1938)  fosfomycin (MONUROL) packet 3 g (3 g Oral Given 07/21/15 2013)    No data found.   6:10 PM Reevaluation with update and discussion. After initial assessment and treatment, an updated evaluation reveals findings discussed with patient and family members, all questions were answered. WENTZ,ELLIOTT L    Labs Review Labs Reviewed  BASIC METABOLIC PANEL - Abnormal; Notable for the following:    Glucose, Bld 162 (*)    BUN 33 (*)    All other components within normal limits  CBC - Abnormal; Notable for the following:    WBC 22.8 (*)    RBC 5.17 (*)    All other components within normal limits  URINALYSIS, ROUTINE W REFLEX MICROSCOPIC (NOT AT [facility]) - Abnormal; Notable for the following:    APPearance CLOUDY (*)    Hgb urine dipstick SMALL (*)    Leukocytes, UA MODERATE (*)    All other components within normal limits  URINE MICROSCOPIC-ADD ON - Abnormal; Notable for the following:    Bacteria, UA  MANY (*)    All other components within normal limits  CBG MONITORING, ED - Abnormal; Notable for the following:    Glucose-Capillary 152 (*)    All other components within normal limits  URINE CULTURE    Imaging Review No results found. I have personally reviewed and evaluated these images and lab results as part of my medical decision-making.   EKG Interpretation None      MDM   Final diagnoses:  Dysphasia  Thrush    Swelling disorder likely secondary to candidiasis esophagitis. Doubt serous paratubal infection, metabolic instability or impending vascular collapse. She will benefit from a definitive diagnostic procedure, EGD.  Nursing Notes Reviewed/ Care Coordinated Applicable Imaging Reviewed Interpretation of Laboratory Data incorporated into ED treatment  The patient appears reasonably screened and/or stabilized for discharge and I doubt any other medical condition or other [facility] requiring further screening, evaluation, or treatment in the ED at this time prior to discharge.  Plan: Home Medications- usual; Home Treatments- rest; return here if the recommended treatment, does not improve the symptoms; Recommended follow up- PCP asap for referral to GI for EGD     Daleen Bo, MD 07/24/15 [phone]

## 2015-07-21 NOTE — ED Notes (Signed)
Pt has been having radiation x2 months. Pt is currently being treated for thrush and is having a difficult time swallowing. Pt is able to swallow, but the pain is too intense. Family states the pt is tearful while attempting to swallow. Pt has been eating soft items such as, applesauce, yogurt and oatmeal with some success.

## 2015-07-21 NOTE — ED Provider Notes (Signed)
Pt here with pain with swallowing, treating for thrush.  UA concerning for UTI - family report foul smelling urine, UA with bacteria, leuks.  Pt without systemic sxs.  CBC with leukocytosis but patient is on decadron.  Treating with one time dose of fosfamycin with planned outpatient follow up.   Quintella Reichert, MD 07/21/15 [phone]

## 2015-07-24 ENCOUNTER — Encounter: Payer: Medicare Other | Admitting: Nutrition

## 2015-07-24 ENCOUNTER — Emergency Department (HOSPITAL_COMMUNITY): Payer: Medicare Other

## 2015-07-24 ENCOUNTER — Ambulatory Visit: Payer: Medicare Other

## 2015-07-24 ENCOUNTER — Ambulatory Visit: Admission: RE | Admit: 2015-07-24 | Payer: Medicare Other | Source: Ambulatory Visit | Admitting: Radiation Oncology

## 2015-07-24 ENCOUNTER — Inpatient Hospital Stay (HOSPITAL_COMMUNITY): Payer: Medicare Other

## 2015-07-24 ENCOUNTER — Inpatient Hospital Stay (HOSPITAL_COMMUNITY)
Admission: EM | Admit: 2015-07-24 | Discharge: 2015-08-02 | DRG: 871 | Disposition: A | Discharge status: Skilled Nursing Facility | Payer: Medicare Other | Attending: Internal Medicine | Admitting: Internal Medicine

## 2015-07-24 ENCOUNTER — Encounter (HOSPITAL_COMMUNITY): Payer: Self-pay | Admitting: Cardiology

## 2015-07-24 DIAGNOSIS — B9620 Unspecified Escherichia coli [E. coli] as the cause of diseases classified elsewhere: Secondary | ICD-10-CM | POA: Diagnosis not present

## 2015-07-24 DIAGNOSIS — E2740 Unspecified adrenocortical insufficiency: Secondary | ICD-10-CM | POA: Diagnosis not present

## 2015-07-24 DIAGNOSIS — D6489 Other specified anemias: Secondary | ICD-10-CM | POA: Diagnosis not present

## 2015-07-24 DIAGNOSIS — N390 Urinary tract infection, site not specified: Secondary | ICD-10-CM | POA: Diagnosis not present

## 2015-07-24 DIAGNOSIS — Z23 Encounter for immunization: Secondary | ICD-10-CM

## 2015-07-24 DIAGNOSIS — R509 Fever, unspecified: Secondary | ICD-10-CM | POA: Diagnosis not present

## 2015-07-24 DIAGNOSIS — J151 Pneumonia due to Pseudomonas: Secondary | ICD-10-CM | POA: Diagnosis present

## 2015-07-24 DIAGNOSIS — Y95 Nosocomial condition: Secondary | ICD-10-CM | POA: Diagnosis not present

## 2015-07-24 DIAGNOSIS — N39498 Other specified urinary incontinence: Secondary | ICD-10-CM | POA: Diagnosis not present

## 2015-07-24 DIAGNOSIS — Z7952 Long term (current) use of systemic steroids: Secondary | ICD-10-CM | POA: Diagnosis not present

## 2015-07-24 DIAGNOSIS — K219 Gastro-esophageal reflux disease without esophagitis: Secondary | ICD-10-CM | POA: Diagnosis not present

## 2015-07-24 DIAGNOSIS — B3781 Candidal esophagitis: Secondary | ICD-10-CM | POA: Diagnosis not present

## 2015-07-24 DIAGNOSIS — A419 Sepsis, unspecified organism: Principal | ICD-10-CM | POA: Diagnosis present

## 2015-07-24 DIAGNOSIS — R0600 Dyspnea, unspecified: Secondary | ICD-10-CM

## 2015-07-24 DIAGNOSIS — Z86011 Personal history of benign neoplasm of the brain: Secondary | ICD-10-CM

## 2015-07-24 DIAGNOSIS — N170 Acute kidney failure with tubular necrosis: Secondary | ICD-10-CM | POA: Diagnosis present

## 2015-07-24 DIAGNOSIS — R569 Unspecified convulsions: Secondary | ICD-10-CM | POA: Diagnosis present

## 2015-07-24 DIAGNOSIS — Z66 Do not resuscitate: Secondary | ICD-10-CM | POA: Diagnosis not present

## 2015-07-24 DIAGNOSIS — D329 Benign neoplasm of meninges, unspecified: Secondary | ICD-10-CM | POA: Diagnosis present

## 2015-07-24 DIAGNOSIS — E785 Hyperlipidemia, unspecified: Secondary | ICD-10-CM | POA: Diagnosis not present

## 2015-07-24 DIAGNOSIS — R739 Hyperglycemia, unspecified: Secondary | ICD-10-CM | POA: Diagnosis not present

## 2015-07-24 DIAGNOSIS — E876 Hypokalemia: Secondary | ICD-10-CM | POA: Diagnosis present

## 2015-07-24 DIAGNOSIS — E8770 Fluid overload, unspecified: Secondary | ICD-10-CM

## 2015-07-24 DIAGNOSIS — J9601 Acute respiratory failure with hypoxia: Secondary | ICD-10-CM | POA: Diagnosis not present

## 2015-07-24 DIAGNOSIS — Z79899 Other long term (current) drug therapy: Secondary | ICD-10-CM

## 2015-07-24 DIAGNOSIS — T380X5A Adverse effect of glucocorticoids and synthetic analogues, initial encounter: Secondary | ICD-10-CM | POA: Diagnosis not present

## 2015-07-24 DIAGNOSIS — I10 Essential (primary) hypertension: Secondary | ICD-10-CM | POA: Diagnosis present

## 2015-07-24 DIAGNOSIS — R7881 Bacteremia: Secondary | ICD-10-CM | POA: Diagnosis not present

## 2015-07-24 DIAGNOSIS — J189 Pneumonia, unspecified organism: Secondary | ICD-10-CM | POA: Diagnosis present

## 2015-07-24 DIAGNOSIS — R269 Unspecified abnormalities of gait and mobility: Secondary | ICD-10-CM | POA: Diagnosis not present

## 2015-07-24 DIAGNOSIS — L89301 Pressure ulcer of unspecified buttock, stage 1: Secondary | ICD-10-CM | POA: Diagnosis present

## 2015-07-24 DIAGNOSIS — F419 Anxiety disorder, unspecified: Secondary | ICD-10-CM | POA: Diagnosis not present

## 2015-07-24 DIAGNOSIS — M19042 Primary osteoarthritis, left hand: Secondary | ICD-10-CM | POA: Diagnosis not present

## 2015-07-24 DIAGNOSIS — N179 Acute kidney failure, unspecified: Secondary | ICD-10-CM | POA: Insufficient documentation

## 2015-07-24 DIAGNOSIS — M19041 Primary osteoarthritis, right hand: Secondary | ICD-10-CM | POA: Diagnosis present

## 2015-07-24 DIAGNOSIS — J850 Gangrene and necrosis of lung: Secondary | ICD-10-CM | POA: Diagnosis present

## 2015-07-24 DIAGNOSIS — E039 Hypothyroidism, unspecified: Secondary | ICD-10-CM | POA: Diagnosis not present

## 2015-07-24 DIAGNOSIS — L8990 Pressure ulcer of unspecified site, unspecified stage: Secondary | ICD-10-CM | POA: Insufficient documentation

## 2015-07-24 DIAGNOSIS — D696 Thrombocytopenia, unspecified: Secondary | ICD-10-CM | POA: Diagnosis not present

## 2015-07-24 DIAGNOSIS — R5381 Other malaise: Secondary | ICD-10-CM | POA: Diagnosis not present

## 2015-07-24 DIAGNOSIS — G9340 Encephalopathy, unspecified: Secondary | ICD-10-CM | POA: Insufficient documentation

## 2015-07-24 DIAGNOSIS — D6959 Other secondary thrombocytopenia: Secondary | ICD-10-CM | POA: Diagnosis not present

## 2015-07-24 DIAGNOSIS — E874 Mixed disorder of acid-base balance: Secondary | ICD-10-CM | POA: Diagnosis not present

## 2015-07-24 DIAGNOSIS — R6521 Severe sepsis with septic shock: Secondary | ICD-10-CM | POA: Diagnosis not present

## 2015-07-24 LAB — URINALYSIS, ROUTINE W REFLEX MICROSCOPIC
BILIRUBIN URINE: NEGATIVE
Bilirubin Urine: NEGATIVE
GLUCOSE, UA: 100 mg/dL — AB
Glucose, UA: 100 mg/dL — AB
Hgb urine dipstick: NEGATIVE
Hgb urine dipstick: NEGATIVE
KETONES UR: NEGATIVE mg/dL
Ketones, ur: NEGATIVE mg/dL
LEUKOCYTES UA: NEGATIVE
LEUKOCYTES UA: NEGATIVE
NITRITE: NEGATIVE
Nitrite: NEGATIVE
PH: 6.5 (ref 5.0–8.0)
PROTEIN: NEGATIVE mg/dL
PROTEIN: NEGATIVE mg/dL
SPECIFIC GRAVITY, URINE: 1.013 (ref 1.005–1.030)
Specific Gravity, Urine: 1.013 (ref 1.005–1.030)
UROBILINOGEN UA: 0.2 mg/dL (ref 0.0–1.0)
Urobilinogen, UA: 0.2 mg/dL (ref 0.0–1.0)
pH: 6.5 (ref 5.0–8.0)

## 2015-07-24 LAB — CBC WITH DIFFERENTIAL/PLATELET
BASOS PCT: 0 %
Basophils Absolute: 0 10*3/uL (ref 0.0–0.1)
EOS PCT: 0 %
Eosinophils Absolute: 0 10*3/uL (ref 0.0–0.7)
HEMATOCRIT: 45.3 % (ref 36.0–46.0)
HEMOGLOBIN: 15.4 g/dL — AB (ref 12.0–15.0)
LYMPHS PCT: 3 %
Lymphs Abs: 0.8 10*3/uL (ref 0.7–4.0)
MCH: 28.6 pg (ref 26.0–34.0)
MCHC: 34 g/dL (ref 30.0–36.0)
MCV: 84.2 fL (ref 78.0–100.0)
MONOS PCT: 4 %
Monocytes Absolute: 1.1 10*3/uL — ABNORMAL HIGH (ref 0.1–1.0)
NEUTROS PCT: 93 %
Neutro Abs: 26.1 10*3/uL — ABNORMAL HIGH (ref 1.7–7.7)
Platelets: 131 10*3/uL — ABNORMAL LOW (ref 150–400)
RBC: 5.38 MIL/uL — AB (ref 3.87–5.11)
RDW: 13 % (ref 11.5–15.5)
WBC: 28 10*3/uL — AB (ref 4.0–10.5)

## 2015-07-24 LAB — I-STAT ARTERIAL BLOOD GAS, ED
ACID-BASE DEFICIT: 9 mmol/L — AB (ref 0.0–2.0)
BICARBONATE: 16 meq/L — AB (ref 20.0–24.0)
O2 Saturation: 85 %
PCO2 ART: 32.8 mmHg — AB (ref 35.0–45.0)
PH ART: 7.297 — AB (ref 7.350–7.450)
PO2 ART: 55 mmHg — AB (ref 80.0–100.0)
Patient temperature: 98.6
TCO2: 17 mmol/L (ref 0–100)

## 2015-07-24 LAB — URINE CULTURE: Culture: >=100000

## 2015-07-24 LAB — PROCALCITONIN: PROCALCITONIN: 27.18 ng/mL

## 2015-07-24 LAB — COMPREHENSIVE METABOLIC PANEL
ALT: 20 U/L (ref 14–54)
ANION GAP: 13 (ref 5–15)
AST: 32 U/L (ref 15–41)
Albumin: 2.9 g/dL — ABNORMAL LOW (ref 3.5–5.0)
Alkaline Phosphatase: 71 U/L (ref 38–126)
BUN: 23 mg/dL — ABNORMAL HIGH (ref 6–20)
CHLORIDE: 100 mmol/L — AB (ref 101–111)
CO2: 22 mmol/L (ref 22–32)
CREATININE: 1.3 mg/dL — AB (ref 0.44–1.00)
Calcium: 8.5 mg/dL — ABNORMAL LOW (ref 8.9–10.3)
GFR, EST AFRICAN AMERICAN: 43 mL/min — AB (ref >60)
GFR, EST NON AFRICAN AMERICAN: 37 mL/min — AB (ref >60)
Glucose, Bld: 169 mg/dL — ABNORMAL HIGH (ref 65–99)
POTASSIUM: 3.6 mmol/L (ref 3.5–5.1)
SODIUM: 135 mmol/L (ref 135–145)
Total Bilirubin: 0.9 mg/dL (ref 0.3–1.2)
Total Protein: 5.5 g/dL — ABNORMAL LOW (ref 6.5–8.1)

## 2015-07-24 LAB — I-STAT CG4 LACTIC ACID, ED
LACTIC ACID, VENOUS: 5.33 mmol/L — AB (ref 0.5–2.0)
LACTIC ACID, VENOUS: 4.98 mmol/L — AB (ref 0.5–2.0)

## 2015-07-24 LAB — CARBOXYHEMOGLOBIN
Carboxyhemoglobin: 0.5 % (ref 0.5–1.5)
METHEMOGLOBIN: 1 % (ref 0.0–1.5)
O2 Saturation: 67 %
Total hemoglobin: 11.6 g/dL — ABNORMAL LOW (ref 12.0–16.0)

## 2015-07-24 LAB — TROPONIN I: TROPONIN I: 0.03 ng/mL (ref <0.031)

## 2015-07-24 LAB — CORTISOL: Cortisol, Plasma: 55.8 ug/dL

## 2015-07-24 LAB — LACTIC ACID, PLASMA
Lactic Acid, Venous: 3.5 mmol/L (ref 0.5–2.0)
Lactic Acid, Venous: 3.2 mmol/L (ref 0.5–2.0)

## 2015-07-24 LAB — MRSA PCR SCREENING: MRSA BY PCR: NEGATIVE

## 2015-07-24 MED ORDER — SUCCINYLCHOLINE CHLORIDE 20 MG/ML IJ SOLN
INTRAMUSCULAR | Status: AC
  Filled 2015-07-24: qty 1

## 2015-07-24 MED ORDER — SODIUM CHLORIDE 0.9 % IV SOLN
INTRAVENOUS | Status: DC
  Administered 2015-07-24: 19:00:00 via INTRAVENOUS

## 2015-07-24 MED ORDER — FENTANYL BOLUS VIA INFUSION
25.0000 ug | INTRAVENOUS | Status: DC | PRN
  Filled 2015-07-24: qty 25

## 2015-07-24 MED ORDER — FLUCONAZOLE IN SODIUM CHLORIDE 100-0.9 MG/50ML-% IV SOLN
100.0000 mg | Freq: Every day | INTRAVENOUS | Status: DC
Start: 2015-07-24 — End: 2015-07-24
  Filled 2015-07-24: qty 50

## 2015-07-24 MED ORDER — FLUCONAZOLE IN SODIUM CHLORIDE 200-0.9 MG/100ML-% IV SOLN
200.0000 mg | Freq: Once | INTRAVENOUS | Status: AC
Start: 2015-07-24 — End: 2015-07-25
  Administered 2015-07-24: 200 mg via INTRAVENOUS
  Filled 2015-07-24: qty 100

## 2015-07-24 MED ORDER — ROCURONIUM BROMIDE 50 MG/5ML IV SOLN
INTRAVENOUS | Status: AC
  Filled 2015-07-24: qty 2

## 2015-07-24 MED ORDER — DOBUTAMINE IN D5W 4-5 MG/ML-% IV SOLN
2.5000 ug/kg/min | INTRAVENOUS | Status: DC
  Filled 2015-07-24: qty 250

## 2015-07-24 MED ORDER — PIPERACILLIN-TAZOBACTAM 3.375 G IVPB
3.3750 g | Freq: Three times a day (TID) | INTRAVENOUS | Status: DC
  Administered 2015-07-24 – 2015-07-25 (×2): 3.375 g via INTRAVENOUS
  Filled 2015-07-24 (×4): qty 50

## 2015-07-24 MED ORDER — MIDAZOLAM HCL 2 MG/2ML IJ SOLN
1.0000 mg | INTRAMUSCULAR | Status: DC | PRN
  Administered 2015-07-26: 1 mg via INTRAVENOUS
  Filled 2015-07-24: qty 2

## 2015-07-24 MED ORDER — HEPARIN SODIUM (PORCINE) 5000 UNIT/ML IJ SOLN
5000.0000 [IU] | Freq: Three times a day (TID) | INTRAMUSCULAR | Status: DC
  Administered 2015-07-24 – 2015-07-26 (×5): 5000 [IU] via SUBCUTANEOUS
  Filled 2015-07-24 (×8): qty 1

## 2015-07-24 MED ORDER — PANTOPRAZOLE SODIUM 40 MG IV SOLR
40.0000 mg | Freq: Every day | INTRAVENOUS | Status: DC
  Administered 2015-07-24 – 2015-07-27 (×4): 40 mg via INTRAVENOUS
  Filled 2015-07-24 (×5): qty 40

## 2015-07-24 MED ORDER — SODIUM CHLORIDE 0.9 % IV BOLUS (SEPSIS)
1000.0000 mL | Freq: Once | INTRAVENOUS | Status: AC
  Administered 2015-07-24: 1000 mL via INTRAVENOUS

## 2015-07-24 MED ORDER — NOREPINEPHRINE BITARTRATE 1 MG/ML IV SOLN
5.0000 ug/min | INTRAVENOUS | Status: DC
  Administered 2015-07-24: 10 ug/min via INTRAVENOUS
  Administered 2015-07-25: 4 ug/min via INTRAVENOUS
  Administered 2015-07-25: 10 ug/min via INTRAVENOUS
  Filled 2015-07-24 (×3): qty 4

## 2015-07-24 MED ORDER — LEVOTHYROXINE SODIUM 100 MCG IV SOLR
37.5000 ug | Freq: Every day | INTRAVENOUS | Status: DC
  Administered 2015-07-25 – 2015-07-28 (×4): 37.5 ug via INTRAVENOUS
  Filled 2015-07-24 (×4): qty 5

## 2015-07-24 MED ORDER — MIDAZOLAM HCL 2 MG/2ML IJ SOLN: 1.0000 mg | INTRAMUSCULAR | Status: DC | PRN

## 2015-07-24 MED ORDER — SODIUM CHLORIDE 0.9 % IV SOLN
25.0000 ug/h | INTRAVENOUS | Status: DC
  Administered 2015-07-24: 50 ug/h via INTRAVENOUS
  Filled 2015-07-24: qty 50

## 2015-07-24 MED ORDER — LIDOCAINE HCL (CARDIAC) 20 MG/ML IV SOLN
INTRAVENOUS | Status: AC
  Filled 2015-07-24: qty 5

## 2015-07-24 MED ORDER — HYDROCORTISONE NA SUCCINATE PF 100 MG IJ SOLR
50.0000 mg | Freq: Four times a day (QID) | INTRAMUSCULAR | Status: DC
  Administered 2015-07-24 – 2015-07-26 (×7): 50 mg via INTRAVENOUS
  Filled 2015-07-24 (×3): qty 1
  Filled 2015-07-24: qty 2
  Filled 2015-07-24 (×3): qty 1
  Filled 2015-07-24: qty 2
  Filled 2015-07-24: qty 1
  Filled 2015-07-24: qty 2
  Filled 2015-07-24: qty 1

## 2015-07-24 MED ORDER — VANCOMYCIN HCL IN DEXTROSE 1-5 GM/200ML-% IV SOLN
1000.0000 mg | Freq: Once | INTRAVENOUS | Status: AC
  Administered 2015-07-24: 1000 mg via INTRAVENOUS
  Filled 2015-07-24: qty 200

## 2015-07-24 MED ORDER — FENTANYL CITRATE (PF) 100 MCG/2ML IJ SOLN: 50.0000 ug | Freq: Once | INTRAMUSCULAR | Status: DC

## 2015-07-24 MED ORDER — FENTANYL CITRATE (PF) 100 MCG/2ML IJ SOLN
100.0000 ug | Freq: Once | INTRAMUSCULAR | Status: DC
  Filled 2015-07-24: qty 2

## 2015-07-24 MED ORDER — MIDAZOLAM HCL 2 MG/2ML IJ SOLN: 4.0000 mg | Freq: Once | INTRAMUSCULAR | Status: DC

## 2015-07-24 MED ORDER — ETOMIDATE 2 MG/ML IV SOLN
INTRAVENOUS | Status: AC
  Filled 2015-07-24: qty 20

## 2015-07-24 MED ORDER — ETOMIDATE 2 MG/ML IV SOLN
INTRAVENOUS | Status: DC | PRN
  Administered 2015-07-24: 10 mg via INTRAVENOUS

## 2015-07-24 MED ORDER — SODIUM CHLORIDE 0.9 % IV SOLN
500.0000 mg | Freq: Two times a day (BID) | INTRAVENOUS | Status: DC
  Administered 2015-07-25: 500 mg via INTRAVENOUS
  Filled 2015-07-24 (×4): qty 5

## 2015-07-24 MED ORDER — VANCOMYCIN HCL 500 MG IV SOLR
500.0000 mg | INTRAVENOUS | Status: DC
  Filled 2015-07-24: qty 500

## 2015-07-24 MED ORDER — MIDAZOLAM HCL 5 MG/5ML IJ SOLN
INTRAMUSCULAR | Status: DC | PRN
  Administered 2015-07-24: 2 mg via INTRAVENOUS

## 2015-07-24 MED ORDER — CHLORHEXIDINE GLUCONATE 0.12% ORAL RINSE (MEDLINE KIT)
15.0000 mL | Freq: Two times a day (BID) | OROMUCOSAL | Status: DC
  Administered 2015-07-24 – 2015-08-01 (×15): 15 mL via OROMUCOSAL

## 2015-07-24 MED ORDER — SODIUM CHLORIDE 0.9 % IV BOLUS (SEPSIS)
1000.0000 mL | INTRAVENOUS | Status: AC
  Administered 2015-07-24 (×2): 1000 mL via INTRAVENOUS

## 2015-07-24 MED ORDER — FLUCONAZOLE IN SODIUM CHLORIDE 100-0.9 MG/50ML-% IV SOLN
100.0000 mg | Freq: Every day | INTRAVENOUS | Status: AC
  Administered 2015-07-25 – 2015-07-31 (×7): 100 mg via INTRAVENOUS
  Filled 2015-07-24 (×9): qty 50

## 2015-07-24 MED ORDER — SODIUM CHLORIDE 0.9 % IV SOLN: 250.0000 mL | INTRAVENOUS | Status: DC | PRN

## 2015-07-24 MED ORDER — HYDROCORTISONE NA SUCCINATE PF 100 MG IJ SOLR: 50.0000 mg | Freq: Four times a day (QID) | INTRAMUSCULAR | Status: DC

## 2015-07-24 MED ORDER — PIPERACILLIN-TAZOBACTAM 3.375 G IVPB 30 MIN
3.3750 g | Freq: Once | INTRAVENOUS | Status: AC
  Administered 2015-07-24: 3.375 g via INTRAVENOUS
  Filled 2015-07-24: qty 50

## 2015-07-24 MED ORDER — HEPARIN SODIUM (PORCINE) 5000 UNIT/ML IJ SOLN: 5000.0000 [IU] | Freq: Three times a day (TID) | INTRAMUSCULAR | Status: DC

## 2015-07-24 MED ORDER — SODIUM CHLORIDE 0.9 % IV SOLN
INTRAVENOUS | Status: DC
  Administered 2015-07-25 – 2015-07-26 (×2): via INTRAVENOUS

## 2015-07-24 MED ORDER — VASOPRESSIN 20 UNIT/ML IV SOLN
0.0300 [IU]/min | INTRAVENOUS | Status: DC
  Administered 2015-07-24: 0.03 [IU]/min via INTRAVENOUS
  Filled 2015-07-24: qty 2

## 2015-07-24 MED ORDER — ANTISEPTIC ORAL RINSE SOLUTION (CORINZ)
7.0000 mL | Freq: Four times a day (QID) | OROMUCOSAL | Status: DC
  Administered 2015-07-25 – 2015-08-02 (×31): 7 mL via OROMUCOSAL

## 2015-07-24 MED ORDER — SODIUM CHLORIDE 0.9 % IV SOLN
500.0000 mL | INTRAVENOUS | Status: DC | PRN
  Administered 2015-07-24 (×2): 500 mL via INTRAVENOUS

## 2015-07-24 MED ORDER — MIDAZOLAM HCL 2 MG/2ML IJ SOLN
4.0000 mg | Freq: Once | INTRAMUSCULAR | Status: DC
  Filled 2015-07-24: qty 4

## 2015-07-24 MED ORDER — FENTANYL CITRATE (PF) 100 MCG/2ML IJ SOLN
INTRAMUSCULAR | Status: DC | PRN
  Administered 2015-07-24 (×2): 50 ug via INTRAVENOUS

## 2015-07-24 NOTE — H&P (Signed)
PULMONARY / CRITICAL CARE MEDICINE   Name: Keara Pagliarulo MRN: [id number] DOB: 03/29/1932    ADMISSION DATE:  07/24/2015  REFERRING MD :  ER  CHIEF COMPLAINT:  Short of breath  INITIAL PRESENTATION:  81 yo female presented with cough, dyspnea, weakness, incontinence from HCAP, sepsis.  STUDIES:   SIGNIFICANT EVENTS: 10/03 Admit, VDRF   HISTORY OF PRESENT ILLNESS:   81 yo female on decadron and XRT for tx of meningioma presented with cough, weakness, incontinence.  She was noted to have PNA on CXR.  Her lactic acid was elevated, and did not improve with volume.  She had increased WOB.  Family reports she has experienced difficulty with swallowing, and last XRT oncology note reports symptoms of odynophagia with tx for thrush.  She required intubation in ER.  PAST MEDICAL HISTORY :   has a past medical history of Hyperlipidemia; Anxiety disorder; Arthritis of knee, right; Colon polyps; Brain cancer ([place]) (03/30/13); Hypothyroidism; UTI (lower urinary tract infection) (2014); Degenerative arthritis of hand; Sepsis ([place]); and SVT (supraventricular tachycardia) ([place]) (2014).  has past surgical history that includes Knee arthroscopy; Cholecystectomy; Abdominal hysterectomy; and Craniotomy (Left, 03/30/2013). Prior to Admission medications   Medication Sig Start Date End Date Taking? Authorizing Provider  ALPRAZolam Duanne Moron) 1 MG tablet Take 1 tablet (1 mg total) by mouth 2 (two) times daily as needed for sleep or anxiety. 04/25/13  Yes Wenda Low, MD  amLODipine (NORVASC) 5 MG tablet Take 5 mg by mouth daily. 07/13/15  Yes Historical Provider, MD  calcium carbonate (OS-CAL) 600 MG TABS Take 600 mg by mouth 2 (two) times daily with a meal.   Yes Historical Provider, MD  cetirizine (ZYRTEC) 10 MG tablet Take 10 mg by mouth daily.   Yes Historical Provider, MD  Cholecalciferol (VITAMIN D) 2000 UNITS CAPS Take 2,000 Units by mouth daily.    Yes Historical Provider, MD  citalopram (CELEXA) 10 MG  tablet Take 10 mg by mouth daily.  05/16/15  Yes Historical Provider, MD  clotrimazole (MYCELEX) 10 MG troche Take 1 lozenge (10 mg total) by mouth 5 (five) times daily. 07/18/15  Yes Thea Silversmith, MD  dexamethasone (DECADRON) 4 MG tablet Take 1 tablet (4 mg total) by mouth 3 (three) times daily. 07/13/15  Yes Arloa Koh, MD  fluconazole (DIFLUCAN) 100 MG tablet Take 1 tablet (100 mg total) by mouth daily. 07/18/15  Yes Thea Silversmith, MD  ibuprofen (ADVIL,MOTRIN) 200 MG tablet Take 400 mg by mouth every 6 (six) hours as needed for headache, mild pain or moderate pain.   Yes Historical Provider, MD  levothyroxine (SYNTHROID, LEVOTHROID) 75 MCG tablet Take 1 tablet (75 mcg total) by mouth daily before breakfast. 04/15/13  Yes Daniel J Angiulli, PA-C  lidocaine (XYLOCAINE) 2 % solution Patient: Mix 1part 2% viscous lidocaine, 1part H20. Swish and/or swallow 76mL of this mixture, 65min before meals and at bedtime, up to QID 07/19/15  Yes Eppie Gibson, MD  metoprolol (LOPRESSOR) 50 MG tablet Take 1 tablet (50 mg total) by mouth every 12 (twelve) hours. Patient taking differently: Take 50 mg by mouth daily.  04/26/13  Yes Wenda Low, MD  Multiple Vitamin (MULTIVITAMIN WITH MINERALS) TABS Take 1 tablet by mouth daily. 04/15/13  Yes Daniel J Angiulli, PA-C  Omega-3 Fatty Acids (FISH OIL) 1000 MG CAPS Take 1-2 capsules (1,000-2,000 mg total) by mouth 2 (two) times daily. Takes 2000 mg in the morning and 1000 mg in the evening 04/15/13  Yes Daniel J Angiulli, PA-C  omeprazole (  PRILOSEC) 40 MG capsule Take 40 mg by mouth daily.  05/16/15  Yes Historical Provider, MD  prochlorperazine (COMPAZINE) 10 MG tablet Take 10 mg by mouth every 6 (six) hours as needed for nausea or vomiting.   Yes Historical Provider, MD  senna-docusate (SENOKOT-S) 8.6-50 MG per tablet Take 1 tablet by mouth at bedtime. 04/01/13  Yes Wenda Low, MD  simvastatin (ZOCOR) 20 MG tablet Take 1 tablet (20 mg total) by mouth every evening.  04/15/13  Yes Lavon Paganini Angiulli, PA-C  ciprofloxacin (CIPRO) 500 MG tablet Take 1 tablet (500 mg total) by mouth 2 (two) times daily. Patient not taking: Reported on 05/12/2015 04/26/13   Wenda Low, MD  levETIRAcetam (KEPPRA) 500 MG tablet Take 1 tablet (500 mg total) by mouth 2 (two) times daily. Patient not taking: Reported on 07/14/2015 04/15/13   Lavon Paganini Angiulli, PA-C  loratadine (CLARITIN) 10 MG tablet Take 1 tablet (10 mg total) by mouth daily. Patient not taking: Reported on 05/26/2015 04/25/13   Wenda Low, MD  magnesium chloride (SLOW-MAG) 64 MG TBEC Take 1 tablet (64 mg total) by mouth daily. Patient not taking: Reported on 05/12/2015 04/15/13   Lavon Paganini Angiulli, PA-C   No Known Allergies  FAMILY HISTORY:  has no family status information on file.  SOCIAL HISTORY:  reports that she has never smoked. She does not have any smokeless tobacco history on file. She reports that she does not drink alcohol or use illicit drugs.  REVIEW OF SYSTEMS:   Negative except above.  SUBJECTIVE:   VITAL SIGNS: Temp:  [97.5 F (36.4 C)] 97.5 F (36.4 C) (10/03 1234) Pulse Rate:  [113-132] 128 (10/03 1615) Resp:  [16-42] 30 (10/03 1615) BP: (96-153)/(58-88) 119/58 mmHg (10/03 1615) SpO2:  [90 %-96 %] 92 % (10/03 1615) HEMODYNAMICS:   VENTILATOR SETTINGS:   INTAKE / OUTPUT: No intake or output data in the 24 hours ending 07/24/15 1706  PHYSICAL EXAMINATION: General: ill appearing Neuro:  Sleepy, follows simple commands, moves extremities HEENT:  Pupils reactive, dry mucosa, no oral exudate Cardiovascular:  Regular, tachycardic Lungs:  B/l crackles, using accessory muscles, gurgling respirations  Abdomen:  Soft, non tender Musculoskeletal: no edema Skin:  No rashes  LABS:  CBC  Recent Labs Lab 07/21/15 1300 07/24/15 1331  WBC 22.8* 28.0*  HGB 14.9 15.4*  HCT 43.3 45.3  PLT 156 131*   Coag's No results for input(s): APTT, INR in the last 168 hours.   BMET  Recent  Labs Lab 07/18/15 1506 07/21/15 1300 07/24/15 1331  NA 135* 135 135  K 3.8 4.8 3.6  CL  --  102 100*  CO2 23 22 22   BUN 42.3* 33* 23*  CREATININE 0.8 0.79 1.30*  GLUCOSE 158* 162* 169*   Electrolytes  Recent Labs Lab 07/18/15 1506 07/21/15 1300 07/24/15 1331  CALCIUM 9.0 9.2 8.5*   Sepsis Markers  Recent Labs Lab 07/24/15 1344 07/24/15 1602  LATICACIDVEN 5.33* 4.98*   ABG No results for input(s): PHART, PCO2ART, PO2ART in the last 168 hours.   Liver Enzymes  Recent Labs Lab 07/24/15 1331  AST 32  ALT 20  ALKPHOS 71  BILITOT 0.9  ALBUMIN 2.9*   Cardiac Enzymes No results for input(s): TROPONINI, PROBNP in the last 168 hours.   Glucose  Recent Labs Lab 07/21/15 1246  GLUCAP 152*    Imaging Dg Chest Port 1 View  07/24/2015   CLINICAL DATA:  Shortness of breath, weakness, near syncopal episode  EXAM: PORTABLE CHEST  1 VIEW  COMPARISON:  07/21/2015  FINDINGS: New hazy opacity over the left lower chest obscuring the left hemidiaphragm compatible with left basilar airspace process and associated left pleural effusion layering posteriorly. Basilar pneumonia not excluded. Right lung remains clear. Mild cardiac enlargement with normal vascularity. No superimposed edema. Negative for pneumothorax. Monitor leads overlie the chest. Bones are osteopenic.  IMPRESSION: New left basilar opacity and likely posterior layering effusion. Appearance compatible with pneumonia.  Recommend radiographic follow-up to document resolution.   Electronically Signed   By: Jerilynn Mages.  Shick M.D.   On: 07/24/2015 13:34     ASSESSMENT / PLAN:  PULMONARY ETT 10/03 >> A: Acute respiratory failure 2nd to HCAP with concern for aspiration. P:   Full vent support F/u CXR, ABG  CARDIOVASCULAR IJ CVL 10/03 >> A:  Septic shock. Hx of HLD. P:  Continue IV fluids F/u lactic acid Pressors as needed to maintain MAP > 65  RENAL A:   AKI >> baseline creatinine 0.79 from 07/21/15. P:    Monitor renal fx, urine outpt  GASTROINTESTINAL A:   Dysphagia. P:   NPO Protonix for SUP  HEMATOLOGIC A:   Leukocytosis. P:  F/u CBC SQ heparin for DVT prevention  INFECTIOUS A:   Septic shock 2nd to HCAP with possible aspiration. P:   Day 1 vancomycin, zosyn  Blood 10/03 >> Urine 10/03 >> Sputum 10/03 >>   ENDOCRINE A:   Hx of hypothyroidism. Chronic steroid use. Hyperglycemia. P:   Continue synthroid Stress dose steroids SSI  NEUROLOGIC A:   Acute encephalopathy 2nd to sepsis. Hx of seizures. Hx of meningioma. P:   RASS goal: -1 Continue keppra   FAMILY  - Updates: 07/24/15  CC time 49 minutes.  [name], MD [facility] Pulmonary/Critical Care 07/24/2015, 5:24 PM Pager:  [phone] After 3pm call: [phone]

## 2015-07-24 NOTE — Progress Notes (Signed)
Utilization Review Completed.  

## 2015-07-24 NOTE — ED Notes (Signed)
EDP at the bedside.  ?

## 2015-07-24 NOTE — ED Notes (Signed)
Myself and [place], RN cleaned patient with soap and water and placed clean, dry diaper on patient with 2 chuks underneath; changed stretcher linens; visitors at bedside

## 2015-07-24 NOTE — Progress Notes (Signed)
[place] Progress Note Patient Name: Meghan Yu DOB: [birth year] MRN: [id number]   Date of Service  07/24/2015  HPI/Events of Note  Hypotensive, tachycardic  eICU Interventions  1L bolus     Intervention Category Intermediate Interventions: Hypotension - evaluation and management  Brooklee Michelin 07/24/2015, 7:50 PM

## 2015-07-24 NOTE — Code Documentation (Signed)
CC MD at bedside, requesting set up for central line placement.

## 2015-07-24 NOTE — Procedures (Signed)
Central Venous Catheter Insertion Procedure Note Meghan Yu [id number] 01/27/1932  Procedure: Insertion of Central Venous Catheter Indications: Assessment of intravascular volume, Drug and/or fluid administration and Frequent blood sampling  Procedure Details Consent: Risks of procedure as well as the alternatives and risks of each were explained to the (patient/caregiver).  Consent for procedure obtained. Time Out: Verified patient identification, verified procedure, site/side was marked, verified correct patient position, special equipment/implants available, medications/allergies/relevent history reviewed, required imaging and test results available.  Performed  Maximum sterile technique was used including antiseptics, cap, gloves, gown, hand hygiene, mask and sheet. Skin prep: Chlorhexidine; local anesthetic administered A antimicrobial bonded/coated triple lumen catheter was placed in the left internal jugular vein using the Seldinger technique.  Evaluation Blood flow good Complications: No apparent complications Patient did tolerate procedure well. Chest X-ray ordered to verify placement.  CXR: normal.  Procedure performed under direct ultrasound guidance for real time vessel cannulation.      Meghan Yu, [place] Pulmonary & Critical Care Medicine Pager: [phone]  or [phone] 07/24/2015, 5:40 PM

## 2015-07-24 NOTE — ED Notes (Signed)
Pt to department via EMS from home- pt is receiving treatment for a brain tumor. Family reports pt became weak at home and had a near syncopal episode. Pt was incontinent of bowel at home. Pt was A&O on EMS arrival. Bp-98/44 Hr-124 20g left wrist.

## 2015-07-24 NOTE — ED Notes (Signed)
Family at bedside. 

## 2015-07-24 NOTE — Progress Notes (Signed)
eLink Physician-Brief Progress Note Patient Name: Samirah Scarpati DOB: 1931/12/31 MRN: [id number]   Date of Service  07/24/2015  HPI/Events of Note  PAtient hypotensive despite fluids  eICU Interventions  Start sepissis shock protocol     Intervention Category Major Interventions: Sepsis - evaluation and management;Shock - evaluation and management  Charly Hunton 07/24/2015, 9:02 PM

## 2015-07-24 NOTE — ED Provider Notes (Addendum)
CSN: [id number]     Arrival date & time 07/24/15  1221 History   First MD Initiated Contact with Patient 07/24/15 1230     Chief Complaint  Patient presents with  . Weakness  . Near Syncope    HPI Patient presents to the emergency room with generalized weakness. Patient has a history of a recurrent meningioma. She has been receiving radiation treatments. Over the last several weeks the patient has had increasing weakness, poor oral intake and falls. The patient went to get up this morning and she was too weak to get out of bed. When her daughter came home and daughter called 911. Patient did start having some loose stools last night. She started coughing today as well. No fevers. No vomiting. No abdominal pain chest pain. Past Medical History  Diagnosis Date  . Hyperlipidemia   . Anxiety disorder   . Arthritis of knee, right     Needs replacement/Dr. Sharol Given  . Colon polyps     colonoscopy by Dr. Wynetta Emery  . Brain cancer ([place]) 03/30/13    Left frontal meningioma  . Hypothyroidism     hypothyroidism  . UTI (lower urinary tract infection) 2014    HX e. coli  . Degenerative arthritis of hand     bilateral  . Sepsis ([place])   . SVT (supraventricular tachycardia) ([place]) 2014   Past Surgical History  Procedure Laterality Date  . Knee arthroscopy    . Cholecystectomy    . Abdominal hysterectomy    . Craniotomy Left 03/30/2013    Procedure: LEFT FRONTAL CRANIOTOMY FOR RESECTION OF MENINGIOMA;  Surgeon: Kristeen Miss, MD;  Location: [facility];  Service: Neurosurgery;  Laterality: Left;  Left Frontal Craniotomy for tumor excision   Family History  Problem Relation Age of Onset  . Stroke Mother   . Cirrhosis Father   . Emphysema Sister   . Rectal cancer Sister    Social History  Substance Use Topics  . Smoking status: Never Smoker   . Smokeless tobacco: None  . Alcohol Use: No   OB History    No data available     Review of Systems  All other systems reviewed and are  negative.     Allergies  Review of patient's allergies indicates no known allergies.  Home Medications   Prior to Admission medications   Medication Sig Start Date End Date Taking? Authorizing Provider  ALPRAZolam Duanne Moron) 1 MG tablet Take 1 tablet (1 mg total) by mouth 2 (two) times daily as needed for sleep or anxiety. 04/25/13   Wenda Low, MD  amLODipine (NORVASC) 5 MG tablet Take 5 mg by mouth daily. 07/13/15   Historical Provider, MD  calcium carbonate (OS-CAL) 600 MG TABS Take 600 mg by mouth 2 (two) times daily with a meal.    Historical Provider, MD  cetirizine (ZYRTEC) 10 MG tablet Take 10 mg by mouth daily.    Historical Provider, MD  Cholecalciferol (VITAMIN D) 2000 UNITS CAPS Take 2,000 Units by mouth daily.     Historical Provider, MD  ciprofloxacin (CIPRO) 500 MG tablet Take 1 tablet (500 mg total) by mouth 2 (two) times daily. Patient not taking: Reported on 05/12/2015 04/26/13   Wenda Low, MD  citalopram (CELEXA) 10 MG tablet Take 10 mg by mouth daily.  05/16/15   Historical Provider, MD  clotrimazole (MYCELEX) 10 MG troche Take 1 lozenge (10 mg total) by mouth 5 (five) times daily. 07/18/15   Thea Silversmith, MD  dexamethasone (DECADRON)  4 MG tablet Take 1 tablet (4 mg total) by mouth 3 (three) times daily. 07/13/15   Arloa Koh, MD  fluconazole (DIFLUCAN) 100 MG tablet Take 1 tablet (100 mg total) by mouth daily. 07/18/15   Thea Silversmith, MD  ibuprofen (ADVIL,MOTRIN) 200 MG tablet Take 400 mg by mouth every 6 (six) hours as needed for headache, mild pain or moderate pain.    Historical Provider, MD  levETIRAcetam (KEPPRA) 500 MG tablet Take 1 tablet (500 mg total) by mouth 2 (two) times daily. Patient not taking: Reported on 07/14/2015 04/15/13   Lavon Paganini Angiulli, PA-C  levothyroxine (SYNTHROID, LEVOTHROID) 75 MCG tablet Take 1 tablet (75 mcg total) by mouth daily before breakfast. 04/15/13   Lavon Paganini Angiulli, PA-C  lidocaine (XYLOCAINE) 2 % solution Patient: Mix 1part  2% viscous lidocaine, 1part H20. Swish and/or swallow 70mL of this mixture, 20min before meals and at bedtime, up to QID 07/19/15   Eppie Gibson, MD  loratadine (CLARITIN) 10 MG tablet Take 1 tablet (10 mg total) by mouth daily. Patient not taking: Reported on 05/26/2015 04/25/13   Wenda Low, MD  magnesium chloride (SLOW-MAG) 64 MG TBEC Take 1 tablet (64 mg total) by mouth daily. Patient not taking: Reported on 05/12/2015 04/15/13   Lavon Paganini Angiulli, PA-C  metoprolol (LOPRESSOR) 50 MG tablet Take 1 tablet (50 mg total) by mouth every 12 (twelve) hours. Patient taking differently: Take 50 mg by mouth daily.  04/26/13   Wenda Low, MD  Multiple Vitamin (MULTIVITAMIN WITH MINERALS) TABS Take 1 tablet by mouth daily. 04/15/13   Lavon Paganini Angiulli, PA-C  Omega-3 Fatty Acids (FISH OIL) 1000 MG CAPS Take 1-2 capsules (1,000-2,000 mg total) by mouth 2 (two) times daily. Takes 2000 mg in the morning and 1000 mg in the evening 04/15/13   Lavon Paganini Angiulli, PA-C  omeprazole (PRILOSEC) 40 MG capsule Take 40 mg by mouth daily.  05/16/15   Historical Provider, MD  prochlorperazine (COMPAZINE) 10 MG tablet Take 10 mg by mouth every 6 (six) hours as needed for nausea or vomiting.    Historical Provider, MD  senna-docusate (SENOKOT-S) 8.6-50 MG per tablet Take 1 tablet by mouth at bedtime. 04/01/13   Wenda Low, MD  simvastatin (ZOCOR) 20 MG tablet Take 1 tablet (20 mg total) by mouth every evening. 04/15/13   Lavon Paganini Angiulli, PA-C   BP 115/76 mmHg  Pulse 114  Temp(Src) 97.5 F (36.4 C) (Oral)  Resp 37  SpO2 93% Physical Exam  Constitutional: No distress.  Frail, elderly  HENT:  Head: Normocephalic and atraumatic.  Right Ear: External ear normal.  Left Ear: External ear normal.  Eyes: Conjunctivae are normal. Right eye exhibits no discharge. Left eye exhibits no discharge. No scleral icterus.  Neck: Neck supple. No tracheal deviation present.  Cardiovascular: Regular rhythm and intact distal pulses.   Tachycardia present.   Pulmonary/Chest: Effort normal. No stridor. No respiratory distress. She has no wheezes. She has rales (diffusely).  Abdominal: Soft. Bowel sounds are normal. She exhibits no distension. There is no tenderness. There is no rebound and no guarding.  Musculoskeletal: She exhibits no edema or tenderness.  Neurological: She is alert. No cranial nerve deficit (no facial droop, extraocular movements intact, no slurred speech) or sensory deficit. She exhibits normal muscle tone. She displays no seizure activity. Coordination normal.  Generalized weakness  Skin: Skin is warm and dry. No rash noted.  Psychiatric: She has a normal mood and affect.  Nursing note and vitals reviewed.  ED Course  Procedures (including critical care time)  CRITICAL CARE Performed by: JASNK,NLZ Total critical care time: 40 Critical care time was exclusive of separately billable procedures and treating other patients. Critical care was necessary to treat or prevent imminent or life-threatening deterioration. Critical care was time spent personally by me on the following activities: development of treatment plan with patient and/or surrogate as well as nursing, discussions with consultants, evaluation of patient's response to treatment, examination of patient, obtaining history from patient or surrogate, ordering and performing treatments and interventions, ordering and review of laboratory studies, ordering and review of radiographic studies, pulse oximetry and re-evaluation of patient's condition.  Labs Review Labs Reviewed  COMPREHENSIVE METABOLIC PANEL - Abnormal; Notable for the following:    Chloride 100 (*)    Glucose, Bld 169 (*)    BUN 23 (*)    Creatinine, Ser 1.30 (*)    Calcium 8.5 (*)    Total Protein 5.5 (*)    Albumin 2.9 (*)    GFR calc non Af Amer 37 (*)    GFR calc Af Amer 43 (*)    All other components within normal limits  CBC WITH DIFFERENTIAL/PLATELET - Abnormal; Notable  for the following:    WBC 28.0 (*)    RBC 5.38 (*)    Hemoglobin 15.4 (*)    Platelets 131 (*)    Neutro Abs 26.1 (*)    Monocytes Absolute 1.1 (*)    All other components within normal limits  I-STAT CG4 LACTIC ACID, ED - Abnormal; Notable for the following:    Lactic Acid, Venous 5.33 (*)    All other components within normal limits  CULTURE, BLOOD (ROUTINE X 2)  CULTURE, BLOOD (ROUTINE X 2)  URINE CULTURE  URINALYSIS, ROUTINE W REFLEX MICROSCOPIC (NOT AT [facility])    Imaging Review Dg Chest Port 1 View  07/24/2015   CLINICAL DATA:  Shortness of breath, weakness, near syncopal episode  EXAM: PORTABLE CHEST 1 VIEW  COMPARISON:  07/21/2015  FINDINGS: New hazy opacity over the left lower chest obscuring the left hemidiaphragm compatible with left basilar airspace process and associated left pleural effusion layering posteriorly. Basilar pneumonia not excluded. Right lung remains clear. Mild cardiac enlargement with normal vascularity. No superimposed edema. Negative for pneumothorax. Monitor leads overlie the chest. Bones are osteopenic.  IMPRESSION: New left basilar opacity and likely posterior layering effusion. Appearance compatible with pneumonia.  Recommend radiographic follow-up to document resolution.   Electronically Signed   By: Jerilynn Mages.  Shick M.D.   On: 07/24/2015 13:34   I have personally reviewed and evaluated these images and lab results as part of my medical decision-making.   EKG Interpretation   Date/Time:  Monday July 24 2015 12:29:10 EDT Ventricular Rate:  121 PR Interval:  171 QRS Duration: 88 QT Interval:  321 QTC Calculation: 455 R Axis:   58 Text Interpretation:  Sinus tachycardia Probable left atrial enlargement  Probable left ventricular hypertrophy Anterior Q waves, possibly due to  LVH No significant change since last tracing Confirmed by Lanyah Spengler  MD-J, Asmaa Tirpak  ([id number]) on 07/24/2015 12:41:19 PM      MDM   Final diagnoses:  Sepsis, due to unspecified organism  ([place])  HCAP (healthcare-associated pneumonia)    Initial labs show pt's lactic acid level is elevated.  She is tachycardic and tachypneic.   Will start empiric abx.  Initial fluid bolus ordered but will infuse a 30 cc kg bolus.  Sepsis - Repeat Assessment Performed at:  [facility]     Blood pressure 115/76, pulse 114, temperature 97.5 F (36.4 C), temperature source Oral, resp. rate 37, SpO2 93 %. Heart:     Tachycardic Lungs:    Rales Capillary Refill:   > 2 sec Peripheral Pulse:   Radial pulse palpable Skin:     Normal Color   1438 discussed case with critical care service.  Recommend repeat lactic acid level to determine whether patient will need to be admitted to the critical care service versus the hospitalist service.  Patient remains tachycardic and tachypnea. We will continue her fluid boluses and her antibiotic infusions.    Dorie Rank, MD 07/24/15 1449  Repeat lactic acid level is decreasing but still elevated.  BP stable but remains tachycardic and tachypneic.  Will consult with critical care again.  Dorie Rank, MD 07/24/15 [phone]

## 2015-07-24 NOTE — Code Documentation (Signed)
Port X-ray called for post intubation X-ray.

## 2015-07-24 NOTE — ED Notes (Signed)
Patient undressed, in gown, on monitor, continuous pulse oximetry, blood pressure cuff and oxygen Roy (3L)

## 2015-07-24 NOTE — ED Notes (Signed)
Patient placed on a bedpan to use; visitors at bedside

## 2015-07-24 NOTE — Procedures (Signed)
Intubation Procedure Note Meghan Yu [id number] 1932/02/19  Procedure: Intubation Indications: Respiratory insufficiency  Procedure Details Consent: Risks of procedure as well as the alternatives and risks of each were explained to the (patient/caregiver).  Consent for procedure obtained. Time Out: Verified patient identification, verified procedure, site/side was marked, verified correct patient position, special equipment/implants available, medications/allergies/relevent history reviewed, required imaging and test results available.  Performed  Maximum sterile technique was used including gloves, hand hygiene and mask.  MAC and 3  Given 2 mg versed, 50 mcg fentanyl, 10 mg etomidate.  Inserted #7.5 ETT to 23 cm at lip using glidescope.  Evaluation Hemodynamic Status: BP stable throughout; O2 sats: stable throughout Patient's Current Condition: stable Complications: No apparent complications Patient did tolerate procedure well. Chest X-ray ordered to verify placement.  CXR: pending.   Meghan Mires, MD [facility] Pulmonary/Critical Care 07/24/2015, 5:05 PM Pager:  [phone] After 3pm call: [phone]

## 2015-07-24 NOTE — Progress Notes (Signed)
CRITICAL VALUE ALERT  Critical value received:  Lactic acic-3.5  Date of notification:  07/24/2015  Time of notification: 1950  Critical value read back:Yes.    Nurse who received alert:  Gabrianna Fassnacht, RN  MD notified (1st page):  Dr. Chase Caller  Time of first page:  1950  MD notified (2nd page):  Time of second page:  Responding MD:  Dr. Chase Caller  Time MD responded:  [phone]

## 2015-07-24 NOTE — Progress Notes (Signed)
°   07/24/15 1717  Clinical Encounter Type  Visited With Family;Health care provider  Visit Type ED  Referral From Family;Nurse  Stress Factors  Family Stress Factors Health changes;Lack of knowledge   Responded to request from family. Additional family members arriving soon to visit patient. Escorted family members to ED waiting area. Provided emotional support to family members. Family was able to visit with patient briefly as patient was preparing to be transferred. Family will contact if they have any further needs.   Drue Dun, Chaplain  07/24/2015, 6:47 PM

## 2015-07-24 NOTE — ED Notes (Addendum)
CC MD and respiratory at bedside to intubate patient.

## 2015-07-24 NOTE — Code Documentation (Signed)
Family updated as to patient's status.

## 2015-07-24 NOTE — Progress Notes (Signed)
   07/24/15 1709  Clinical Encounter Type  Visited With Family;Health care provider  Visit Type Initial  Referral From Nurse  Stress Factors  Family Stress Factors Health changes   Chaplain responded to a request by a nurse to visit with the family of the patient while she was being intubated. Chaplain support is available as needed.   Jeri Lager, Chaplain 07/24/2015 5:11 PM

## 2015-07-24 NOTE — Progress Notes (Signed)
ANTIBIOTIC CONSULT NOTE - INITIAL  Pharmacy Consult for vancomycin + zosyn Indication: rule out sepsis  No Known Allergies  Patient Measurements:   Adjusted Body Weight:   Vital Signs: Temp: 97.5 F (36.4 C) (10/03 1234) Temp Source: Oral (10/03 1234) BP: 123/65 mmHg (10/03 1400) Pulse Rate: 116 (10/03 1400) Intake/Output from previous day:   Intake/Output from this shift:    Labs:  Recent Labs  07/24/15 1331  WBC 28.0*  HGB 15.4*  PLT 131*  CREATININE 1.30*   Estimated Creatinine Clearance: 26.3 mL/min (by C-G formula based on Cr of 1.3). No results for input(s): VANCOTROUGH, VANCOPEAK, VANCORANDOM, GENTTROUGH, GENTPEAK, GENTRANDOM, TOBRATROUGH, TOBRAPEAK, TOBRARND, AMIKACINPEAK, AMIKACINTROU, AMIKACIN in the last 72 hours.   Microbiology: Recent Results (from the past 720 hour(s))  Urine culture     Status: None   Collection Time: 07/21/15  5:22 PM  Result Value Ref Range Status   Specimen Description URINE, CLEAN CATCH  Final   Special Requests NONE  Final   Culture >=100,000 COLONIES/mL ESCHERICHIA COLI  Final   Report Status 07/24/2015 FINAL  Final   Organism ID, Bacteria ESCHERICHIA COLI  Final      Susceptibility   Escherichia coli - MIC*    AMPICILLIN >=32 RESISTANT Resistant     CEFAZOLIN <=4 SENSITIVE Sensitive     CEFTRIAXONE <=1 SENSITIVE Sensitive     CIPROFLOXACIN 1 SENSITIVE Sensitive     GENTAMICIN <=1 SENSITIVE Sensitive     IMIPENEM <=0.25 SENSITIVE Sensitive     NITROFURANTOIN <=16 SENSITIVE Sensitive     TRIMETH/SULFA <=20 SENSITIVE Sensitive     AMPICILLIN/SULBACTAM 16 INTERMEDIATE Intermediate     PIP/TAZO <=4 SENSITIVE Sensitive     * >=100,000 COLONIES/mL ESCHERICHIA COLI    Medical History: Past Medical History  Diagnosis Date  . Hyperlipidemia   . Anxiety disorder   . Arthritis of knee, right     Needs replacement/Dr. Sharol Given  . Colon polyps     colonoscopy by Dr. Wynetta Emery  . Brain cancer ([place]) 03/30/13    Left frontal  meningioma  . Hypothyroidism     hypothyroidism  . UTI (lower urinary tract infection) 2014    HX e. coli  . Degenerative arthritis of hand     bilateral  . Sepsis ([place])   . SVT (supraventricular tachycardia) ([place]) 2014    Medications:  Anti-infectives    Start     Dose/Rate Route Frequency Ordered Stop   07/25/15 1400  vancomycin (VANCOCIN) 500 mg in sodium chloride 0.9 % 100 mL IVPB     500 mg 100 mL/hr over 60 Minutes Intravenous Every 24 hours 07/24/15 1428     07/24/15 2000  piperacillin-tazobactam (ZOSYN) IVPB 3.375 g     3.375 g 12.5 mL/hr over 240 Minutes Intravenous Every 8 hours 07/24/15 1428     07/24/15 1400  piperacillin-tazobactam (ZOSYN) IVPB 3.375 g     3.375 g 100 mL/hr over 30 Minutes Intravenous  Once 07/24/15 1347 07/24/15 1428   07/24/15 1400  vancomycin (VANCOCIN) IVPB 1000 mg/200 mL premix     1,000 mg 200 mL/hr over 60 Minutes Intravenous  Once 07/24/15 1347       Assessment: 6 yof presented to the ED with weakness and near syncope. To start empiric broad-spectrum antibiotics for sepsis. Pt is afebrile but tachycardic and WBC is significantly elevated at 28. Lactic acid is elevated at 5.22 and Scr is above baseline at 1.3. MD ordered first doses.   Vanc 10/3>> Zosyn 10/3>>  Goal of Therapy:  Vancomycin trough level 15-20 mcg/ml  Plan:  - Vancomycin 500mg  IV Q24H - Zosyn 3.375gm IV Q8H (4 hr inf) - F/u renal fxn, C&S, clinical status and trough at [place], Rande Lawman 07/24/2015,2:29 PM

## 2015-07-24 NOTE — Progress Notes (Signed)
CRITICAL VALUE ALERT  Critical value received:  Lactic acid-3.2  Date of notification:  07/24/2015  Time of notification:  9:32 PM  Critical value read back:Yes.    Nurse who received alert:  Dillard Essex  MD notified (1st page):  Dr. Chase Caller  Time of first page:  9:32 PM  MD notified (2nd page):  Time of second page:  Responding MD:  Dr. Chase Caller  Time MD responded:  9:32 PM

## 2015-07-24 NOTE — Progress Notes (Signed)
Patient intubated by MD placed on above vent settings per Dr. Llana Aliment, good color change on ETCO2 detector good BBS ausculted over ling fields, SATS 93%, HR 105, RR 22, will continue to monitor patient.

## 2015-07-24 NOTE — Progress Notes (Deleted)
I have completed an initial review on this patient. Thank-You., Meghan Yu

## 2015-07-24 NOTE — ED Notes (Signed)
Myself and Debbie, EMT cleaned up patient; placed a dry clean diaper on patient and chuks underneath patient; visitors stepped outside of door; also Debbie, EMT assisted me with in and out cath of patient; urine specimen was collected and sent to lab for testing

## 2015-07-24 NOTE — Progress Notes (Signed)
Increased O2 to 100% due to low paO2 and SATS on ABG results.

## 2015-07-25 ENCOUNTER — Inpatient Hospital Stay (HOSPITAL_COMMUNITY): Payer: Medicare Other

## 2015-07-25 DIAGNOSIS — L8990 Pressure ulcer of unspecified site, unspecified stage: Secondary | ICD-10-CM | POA: Insufficient documentation

## 2015-07-25 DIAGNOSIS — G9340 Encephalopathy, unspecified: Secondary | ICD-10-CM | POA: Insufficient documentation

## 2015-07-25 DIAGNOSIS — N179 Acute kidney failure, unspecified: Secondary | ICD-10-CM

## 2015-07-25 LAB — BLOOD GAS, ARTERIAL
Acid-base deficit: 8.8 mmol/L — ABNORMAL HIGH (ref 0.0–2.0)
Bicarbonate: 16 meq/L — ABNORMAL LOW (ref 20.0–24.0)
Drawn by: 227661
FIO2: 0.8
MECHVT: 440 mL
O2 Saturation: 96.2 %
PEEP: 5 cmH2O
Patient temperature: 98.6
RATE: 22 {breaths}/min
TCO2: 17 mmol/L (ref 0–100)
pCO2 arterial: 31.4 mmHg — ABNORMAL LOW (ref 35.0–45.0)
pH, Arterial: 7.328 — ABNORMAL LOW (ref 7.350–7.450)
pO2, Arterial: 91 mmHg (ref 80.0–100.0)

## 2015-07-25 LAB — POCT I-STAT 3, ART BLOOD GAS (G3+)
ACID-BASE DEFICIT: 8 mmol/L — AB (ref 0.0–2.0)
Acid-base deficit: 10 mmol/L — ABNORMAL HIGH (ref 0.0–2.0)
Bicarbonate: 15.6 mEq/L — ABNORMAL LOW (ref 20.0–24.0)
Bicarbonate: 17.4 mEq/L — ABNORMAL LOW (ref 20.0–24.0)
O2 SAT: 96 %
O2 SAT: 96 %
PCO2 ART: 34.7 mmHg — AB (ref 35.0–45.0)
PH ART: 7.262 — AB (ref 7.350–7.450)
PH ART: 7.317 — AB (ref 7.350–7.450)
PO2 ART: 91 mmHg (ref 80.0–100.0)
PO2 ART: 89 mmHg (ref 80.0–100.0)
Patient temperature: 98.6
TCO2: 17 mmol/L (ref 0–100)
TCO2: 18 mmol/L (ref 0–100)
pCO2 arterial: 34 mmHg — ABNORMAL LOW (ref 35.0–45.0)

## 2015-07-25 LAB — GLUCOSE, CAPILLARY
Glucose-Capillary: 183 mg/dL — ABNORMAL HIGH (ref 65–99)
Glucose-Capillary: 195 mg/dL — ABNORMAL HIGH (ref 65–99)
Glucose-Capillary: 140 mg/dL — ABNORMAL HIGH (ref 65–99)
Glucose-Capillary: 163 mg/dL — ABNORMAL HIGH (ref 65–99)

## 2015-07-25 LAB — BASIC METABOLIC PANEL
ANION GAP: 9 (ref 5–15)
BUN: 24 mg/dL — ABNORMAL HIGH (ref 6–20)
CHLORIDE: 107 mmol/L (ref 101–111)
CO2: 18 mmol/L — AB (ref 22–32)
Calcium: 6.9 mg/dL — ABNORMAL LOW (ref 8.9–10.3)
Creatinine, Ser: 1.24 mg/dL — ABNORMAL HIGH (ref 0.44–1.00)
GFR calc Af Amer: 46 mL/min — ABNORMAL LOW (ref >60)
GFR, EST NON AFRICAN AMERICAN: 40 mL/min — AB (ref >60)
Glucose, Bld: 222 mg/dL — ABNORMAL HIGH (ref 65–99)
POTASSIUM: 3.9 mmol/L (ref 3.5–5.1)
Sodium: 134 mmol/L — ABNORMAL LOW (ref 135–145)

## 2015-07-25 LAB — URINALYSIS, ROUTINE W REFLEX MICROSCOPIC
BILIRUBIN URINE: NEGATIVE
GLUCOSE, UA: NEGATIVE mg/dL
Ketones, ur: NEGATIVE mg/dL
Leukocytes, UA: NEGATIVE
Nitrite: NEGATIVE
PH: 5 (ref 5.0–8.0)
Protein, ur: 30 mg/dL — AB
SPECIFIC GRAVITY, URINE: 1.018 (ref 1.005–1.030)
Urobilinogen, UA: 0.2 mg/dL (ref 0.0–1.0)

## 2015-07-25 LAB — URINE CULTURE: CULTURE: NO GROWTH

## 2015-07-25 LAB — LACTIC ACID, PLASMA
LACTIC ACID, VENOUS: 3.3 mmol/L — AB (ref 0.5–2.0)
Lactic Acid, Venous: 2.1 mmol/L (ref 0.5–2.0)

## 2015-07-25 LAB — URINE MICROSCOPIC-ADD ON

## 2015-07-25 LAB — PHOSPHORUS
PHOSPHORUS: 3.1 mg/dL (ref 2.5–4.6)
Phosphorus: 4.2 mg/dL (ref 2.5–4.6)
Phosphorus: 1.9 mg/dL — ABNORMAL LOW (ref 2.5–4.6)

## 2015-07-25 LAB — CBC
HEMATOCRIT: 35.4 % — AB (ref 36.0–46.0)
Hemoglobin: 12 g/dL (ref 12.0–15.0)
MCH: 29.2 pg (ref 26.0–34.0)
MCHC: 33.9 g/dL (ref 30.0–36.0)
MCV: 86.1 fL (ref 78.0–100.0)
Platelets: 152 10*3/uL (ref 150–400)
RBC: 4.11 MIL/uL (ref 3.87–5.11)
RDW: 13.7 % (ref 11.5–15.5)
WBC: 33.8 10*3/uL — AB (ref 4.0–10.5)

## 2015-07-25 LAB — MAGNESIUM
MAGNESIUM: 2.5 mg/dL — AB (ref 1.7–2.4)
Magnesium: 1.3 mg/dL — ABNORMAL LOW (ref 1.7–2.4)
Magnesium: 1.2 mg/dL — ABNORMAL LOW (ref 1.7–2.4)

## 2015-07-25 LAB — PROCALCITONIN: PROCALCITONIN: 27.91 ng/mL

## 2015-07-25 MED ORDER — FLUDROCORTISONE 0.1 MG/ML ORAL SUSPENSION
0.1000 mg | Freq: Every day | ORAL | Status: DC
  Filled 2015-07-25 (×2): qty 1

## 2015-07-25 MED ORDER — PRO-STAT SUGAR FREE PO LIQD
30.0000 mL | Freq: Two times a day (BID) | ORAL | Status: DC
  Filled 2015-07-25 (×2): qty 30

## 2015-07-25 MED ORDER — FLUDROCORTISONE 0.1 MG/ML ORAL SUSPENSION
0.1000 mg | Freq: Every day | ORAL | Status: DC
  Administered 2015-07-25: 0.1 mg
  Filled 2015-07-25: qty 1

## 2015-07-25 MED ORDER — SODIUM CHLORIDE 0.9 % IV BOLUS (SEPSIS)
1000.0000 mL | Freq: Once | INTRAVENOUS | Status: AC
  Administered 2015-07-25: 1000 mL via INTRAVENOUS

## 2015-07-25 MED ORDER — MAGNESIUM SULFATE 4 GM/100ML IV SOLN
4.0000 g | Freq: Once | INTRAVENOUS | Status: AC
  Administered 2015-07-25: 4 g via INTRAVENOUS
  Filled 2015-07-25: qty 100

## 2015-07-25 MED ORDER — VITAL AF 1.2 CAL PO LIQD
1000.0000 mL | ORAL | Status: DC
  Administered 2015-07-25 – 2015-07-27 (×2): 1000 mL
  Filled 2015-07-25 (×4): qty 1000

## 2015-07-25 MED ORDER — INSULIN ASPART 100 UNIT/ML ~~LOC~~ SOLN
0.0000 [IU] | SUBCUTANEOUS | Status: DC
  Administered 2015-07-25 (×4): 2 [IU] via SUBCUTANEOUS
  Administered 2015-07-25 (×2): 1 [IU] via SUBCUTANEOUS
  Administered 2015-07-26 – 2015-07-27 (×7): 2 [IU] via SUBCUTANEOUS
  Administered 2015-07-27: 1 [IU] via SUBCUTANEOUS
  Administered 2015-07-27 (×2): 2 [IU] via SUBCUTANEOUS
  Administered 2015-07-27 – 2015-07-28 (×3): 3 [IU] via SUBCUTANEOUS
  Administered 2015-07-28: 2 [IU] via SUBCUTANEOUS
  Administered 2015-07-28: 1 [IU] via SUBCUTANEOUS
  Administered 2015-07-28 – 2015-07-29 (×2): 5 [IU] via SUBCUTANEOUS
  Administered 2015-07-29: 7 [IU] via SUBCUTANEOUS
  Administered 2015-07-29: 2 [IU] via SUBCUTANEOUS
  Administered 2015-07-29: 3 [IU] via SUBCUTANEOUS
  Administered 2015-07-29: 1 [IU] via SUBCUTANEOUS
  Administered 2015-07-29: 7 [IU] via SUBCUTANEOUS
  Administered 2015-07-30: 1 [IU] via SUBCUTANEOUS
  Administered 2015-07-30: 2 [IU] via SUBCUTANEOUS
  Administered 2015-07-30: 3 [IU] via SUBCUTANEOUS
  Administered 2015-07-30: 5 [IU] via SUBCUTANEOUS
  Administered 2015-07-30 (×2): 3 [IU] via SUBCUTANEOUS
  Administered 2015-07-31: 2 [IU] via SUBCUTANEOUS
  Administered 2015-07-31: 1 [IU] via SUBCUTANEOUS
  Administered 2015-07-31 (×2): 2 [IU] via SUBCUTANEOUS
  Administered 2015-07-31: 7 [IU] via SUBCUTANEOUS
  Administered 2015-07-31 – 2015-08-01 (×2): 1 [IU] via SUBCUTANEOUS
  Administered 2015-08-01: 2 [IU] via SUBCUTANEOUS
  Administered 2015-08-01: 7 [IU] via SUBCUTANEOUS
  Administered 2015-08-01: 3 [IU] via SUBCUTANEOUS
  Administered 2015-08-01 (×2): 2 [IU] via SUBCUTANEOUS
  Administered 2015-08-02: 7 [IU] via SUBCUTANEOUS
  Administered 2015-08-02: 1 [IU] via SUBCUTANEOUS

## 2015-07-25 MED ORDER — INFLUENZA VAC SPLIT QUAD 0.5 ML IM SUSY
0.5000 mL | PREFILLED_SYRINGE | INTRAMUSCULAR | Status: AC
  Administered 2015-07-28: 0.5 mL via INTRAMUSCULAR
  Filled 2015-07-25 (×2): qty 0.5

## 2015-07-25 MED ORDER — PRO-STAT SUGAR FREE PO LIQD
30.0000 mL | Freq: Two times a day (BID) | ORAL | Status: AC
  Administered 2015-07-25 – 2015-07-26 (×2): 30 mL
  Filled 2015-07-25 (×2): qty 30

## 2015-07-25 MED ORDER — SODIUM CHLORIDE 0.9 % IV SOLN
500.0000 mg | Freq: Two times a day (BID) | INTRAVENOUS | Status: DC
  Administered 2015-07-25 (×2): 500 mg via INTRAVENOUS
  Filled 2015-07-25 (×4): qty 0.5

## 2015-07-25 MED ORDER — FLUDROCORTISONE ACETATE 0.1 MG PO TABS
0.1000 mg | ORAL_TABLET | Freq: Every day | ORAL | Status: DC
  Filled 2015-07-25: qty 1

## 2015-07-25 MED ORDER — VITAL HIGH PROTEIN PO LIQD
1000.0000 mL | ORAL | Status: DC
  Filled 2015-07-25 (×2): qty 1000

## 2015-07-25 MED ORDER — STERILE WATER FOR INJECTION IV SOLN
INTRAVENOUS | Status: DC
  Administered 2015-07-25 – 2015-07-26 (×2): via INTRAVENOUS
  Filled 2015-07-25 (×4): qty 850

## 2015-07-25 NOTE — Progress Notes (Signed)
Fentanyl drip 60 ml wasted in sink.  Witnessed by Claudie Revering, RN

## 2015-07-25 NOTE — Progress Notes (Signed)
ANTIBIOTIC CONSULT NOTE - INITIAL  Pharmacy Consult for meropenem Indication: rule out sepsis  No Known Allergies  Patient Measurements: Height: 4\' 11"  (149.9 cm) Weight: 140 lb 10.5 oz (63.8 kg) IBW/kg (Calculated) : 43.2 Adjusted Body Weight: 63  Vital Signs: Temp: 98.7 F (37.1 C) (10/04 0856) Temp Source: Oral (10/04 0856) BP: 109/71 mmHg (10/04 0900) Pulse Rate: 88 (10/04 0900) Intake/Output from previous day: 10/03 0701 - 10/04 0700 In: 3811.5 [I.V.:2416.5; NG/GT:90; IV Piggyback:1305] Out: 680 [Urine:680] Intake/Output from this shift: Total I/O In: 343.9 [I.V.:293.9; NG/GT:50] Out: 30 [Urine:30]  Labs:  Recent Labs  07/24/15 1331 07/25/15 0421  WBC 28.0* 33.8*  HGB 15.4* 12.0  PLT 131* 152  CREATININE 1.30* 1.24*   Estimated Creatinine Clearance: 28.9 mL/min (by C-G formula based on Cr of 1.24). No results for input(s): VANCOTROUGH, VANCOPEAK, VANCORANDOM, GENTTROUGH, GENTPEAK, GENTRANDOM, TOBRATROUGH, TOBRAPEAK, TOBRARND, AMIKACINPEAK, AMIKACINTROU, AMIKACIN in the last 72 hours.   Microbiology: Recent Results (from the past 720 hour(s))  Urine culture     Status: None   Collection Time: 07/21/15  5:22 PM  Result Value Ref Range Status   Specimen Description URINE, CLEAN CATCH  Final   Special Requests NONE  Final   Culture >=100,000 COLONIES/mL ESCHERICHIA COLI  Final   Report Status 07/24/2015 FINAL  Final   Organism ID, Bacteria ESCHERICHIA COLI  Final      Susceptibility   Escherichia coli - MIC*    AMPICILLIN >=32 RESISTANT Resistant     CEFAZOLIN <=4 SENSITIVE Sensitive     CEFTRIAXONE <=1 SENSITIVE Sensitive     CIPROFLOXACIN 1 SENSITIVE Sensitive     GENTAMICIN <=1 SENSITIVE Sensitive     IMIPENEM <=0.25 SENSITIVE Sensitive     NITROFURANTOIN <=16 SENSITIVE Sensitive     TRIMETH/SULFA <=20 SENSITIVE Sensitive     AMPICILLIN/SULBACTAM 16 INTERMEDIATE Intermediate     PIP/TAZO <=4 SENSITIVE Sensitive     * >=100,000 COLONIES/mL  ESCHERICHIA COLI  Blood Culture (routine x 2)     Status: None (Preliminary result)   Collection Time: 07/24/15  1:32 PM  Result Value Ref Range Status   Specimen Description BLOOD LEFT FOREARM  Final   Special Requests BOTTLES DRAWN AEROBIC AND ANAEROBIC 5CC  Final   Culture  Setup Time   Final    GRAM NEGATIVE RODS AEROBIC BOTTLE ONLY CRITICAL RESULT CALLED TO, READ BACK BY AND VERIFIED WITH: MINDY HOPPER @0451  07/25/15 MKELLY    Culture PENDING  Incomplete   Report Status PENDING  Incomplete  Blood Culture (routine x 2)     Status: None (Preliminary result)   Collection Time: 07/24/15  1:35 PM  Result Value Ref Range Status   Specimen Description BLOOD RIGHT ARM  Final   Special Requests BOTTLES DRAWN AEROBIC AND ANAEROBIC 5CC  Final   Culture  Setup Time   Final    GRAM NEGATIVE RODS AEROBIC BOTTLE ONLY CRITICAL RESULT CALLED TO, READ BACK BY AND VERIFIED WITH: MINDY HOPPER @0451  07/25/15 MKELLY    Culture PENDING  Incomplete   Report Status PENDING  Incomplete  Urine culture     Status: None (Preliminary result)   Collection Time: 07/24/15  5:07 PM  Result Value Ref Range Status   Specimen Description URINE, CATHETERIZED  Final   Special Requests NONE  Final   Culture NO GROWTH < 24 HOURS  Final   Report Status PENDING  Incomplete  Culture, respiratory (NON-Expectorated)     Status: None (Preliminary result)   Collection Time:  07/24/15  6:10 PM  Result Value Ref Range Status   Specimen Description TRACHEAL ASPIRATE  Final   Special Requests Immunocompromised  Final   Gram Stain   Final    ABUNDANT WBC PRESENT, PREDOMINANTLY PMN RARE SQUAMOUS EPITHELIAL CELLS PRESENT NO ORGANISMS SEEN Performed at [facility]    Culture PENDING  Incomplete   Report Status PENDING  Incomplete  MRSA PCR Screening     Status: None   Collection Time: 07/24/15  7:18 PM  Result Value Ref Range Status   MRSA by PCR NEGATIVE NEGATIVE Final    Comment:        The GeneXpert MRSA  Assay (FDA approved for NASAL specimens only), is one component of a comprehensive MRSA colonization surveillance program. It is not intended to diagnose MRSA infection nor to guide or monitor treatment for MRSA infections.     Medical History: Past Medical History  Diagnosis Date  . Hyperlipidemia   . Anxiety disorder   . Arthritis of knee, right     Needs replacement/Dr. Sharol Given  . Colon polyps     colonoscopy by Dr. Wynetta Emery  . Brain cancer ([place]) 03/30/13    Left frontal meningioma  . Hypothyroidism     hypothyroidism  . UTI (lower urinary tract infection) 2014    HX e. coli  . Degenerative arthritis of hand     bilateral  . Sepsis ([place])   . SVT (supraventricular tachycardia) ([place]) 2014    Medications:  Scheduled:  . antiseptic oral rinse  7 mL Mouth Rinse QID  . chlorhexidine gluconate  15 mL Mouth Rinse BID  . feeding supplement (PRO-STAT SUGAR FREE 64)  30 mL Per Tube BID  . feeding supplement (VITAL HIGH PROTEIN)  1,000 mL Per Tube Q24H  . fluconazole (DIFLUCAN) IV  100 mg Intravenous QHS  . fludrocortisone  0.1 mg Per Tube Daily  . heparin  5,000 Units Subcutaneous 3 times per day  . hydrocortisone sodium succinate  50 mg Intravenous Q6H  . insulin aspart  0-9 Units Subcutaneous 6 times per day  . levothyroxine  37.5 mcg Intravenous Daily  . magnesium sulfate 1 - 4 g bolus IVPB  4 g Intravenous Once  . meropenem (MERREM) IV  500 mg Intravenous Q12H  . midazolam  4 mg Intravenous Once  . pantoprazole (PROTONIX) IV  40 mg Intravenous QHS  . sodium chloride  1,000 mL Intravenous Once   Assessment: 81 yo presented to the ED with weakness and near syncope. Started vancomycin and zosyn in the ED for sepsis. Recently seen on 9/30 for a UTI and given fosfomycin*1.   Pt is afebrile, WBC were significantly elevated on admission and have now trended up to 33.8. Lactic acid has remained elevated. Scr is 1.24 (baseline ~1).  UA 10/3>>pending  BC*2 10/3>>GRAM NEGATIVE  RODS  RespC 10/3>>pending   Plan:  D/c Vancomycin and Zosyn Start Meropenem 500 mg IV q12h Monitor renal fxn and adjust dosing accordingly F/u cultures, CBC, signs/symptoms of infection   Melburn Popper, PharmD Clinical Pharmacy Resident Pager: [phone] 07/25/2015 10:29 AM

## 2015-07-25 NOTE — Progress Notes (Signed)
eLink Physician-Brief Progress Note Patient Name: Meghan Yu DOB: 06-Sep-1932 MRN: [id number]   Date of Service  07/25/2015  HPI/Events of Note    eICU Interventions  SSI -on decadron, sensitive CVP, co-ox goals met     Intervention Category Major Interventions: Sepsis - evaluation and management Intermediate Interventions: Hyperglycemia - evaluation and treatment  ALVA,RAKESH V. 07/25/2015, 12:29 AM

## 2015-07-25 NOTE — Progress Notes (Signed)
CRITICAL VALUE ALERT  Critical value received:  Lactic acid-3.3  Date of notification:  07/25/2015  Time of notification:  5:13 AM  Critical value read back:Yes.    Nurse who received alert:  Dillard Essex  MD notified (1st page):  Georgann Housekeeper, NP  Time of first page:  5:13 AM  MD notified (2nd page):  Time of second page:  Responding MD:  Georgann Housekeeper, NP  Time MD responded:  5:14 AM

## 2015-07-25 NOTE — Progress Notes (Addendum)
PCCM INTERVAL PROGRESS NOTE  ABG noted, metabolic acidosis with inadequate respiratory compensation. Patient dyssynchronous with ventilator. Will attempt to increase sedation as rate is already at 22. Hope this will help with synchrony. Will draw Am labs early and add lactic acid to better evaluate origin of acidosis. Has been intermittently hypotensive overnight. On levophed and septic shock protocol.   Addendum: AM labs reveal NAG > patient was reportedly having excessive diarrhea at home. Will add HCO3 infusion and repeat ABG.  Georgann Housekeeper, AGACNP-BC [facility] Pulmonology/Critical Care Pager [phone] or [phone]  07/25/2015 4:17 AM

## 2015-07-25 NOTE — Progress Notes (Signed)
Initial Nutrition Assessment  DOCUMENTATION CODES:   Not applicable  INTERVENTION:    Initiate TF via OGT with Vital AF 1.2 at 25 ml/h and Prostat 30 ml BID on day 1; on day 2 d/c Prostat, increase to goal rate of 45 ml/h (1080 ml per day) to provide 1296 kcals, 81 gm protein, 876 ml free water daily.  NUTRITION DIAGNOSIS:   Inadequate oral intake related to inability to eat as evidenced by NPO status.  GOAL:   Patient will meet greater than or equal to 90% of their needs  MONITOR:   Vent status, TF tolerance, Weight trends, Labs  REASON FOR ASSESSMENT:   Consult Enteral/tube feeding initiation and management  ASSESSMENT:   81yo female presented with cough, dyspnea, weakness, incontinence from PNA, sepsis. Recently received radiation for meningioma.  Labs reviewed: sodium and magnesium are low. Patient's family member reports that patient was 127 lbs on Friday at her doctor's appointment. Usual weight ~139 lbs one month prior to that. Patient with 9% weight loss in one month. Weight now slightly above usual weight likely due to fluids.   Patient is currently intubated on ventilator support MV: 10.8 L/min Temp (24hrs), Avg:98.5 F (36.9 C), Min:97.5 F (36.4 C), Max:98.8 F (37.1 C)   Diet Order:  Diet NPO time specified  Skin:  Wound (see comment) (stage I pressure ulcer to buttocks)  Last BM:  unknown  Height:   Ht Readings from Last 1 Encounters:  07/24/15 4\' 11"  (1.499 m)    Weight:   Wt Readings from Last 1 Encounters:  07/25/15 140 lb 10.5 oz (63.8 kg)    Ideal Body Weight:  44.5 kg  BMI:  Body mass index is 28.39 kg/(m^2).  Estimated Nutritional Needs:   Kcal:  1293  Protein:  75-95 gm  Fluid:  1.5 L  EDUCATION NEEDS:   No education needs identified at this time   Molli Barrows, [place], [place], [place] Pager [phone] After Hours Pager [phone]

## 2015-07-25 NOTE — Progress Notes (Signed)
CRITICAL VALUE ALERT  Critical value received:  + blood cultures, gram - rods in both aerobic bottles  Date of notification:  07/25/2015  Time of notification:  4:54 AM  Critical value read back:Yes.    Nurse who received alert:  Dillard Essex  MD notified (1st page):  Dr. Elsworth Soho  Time of first page:  4:54 AM  MD notified (2nd page):  Time of second page:  Responding MD:  Dr. Elsworth Soho  Time MD responded:  4:54 AM

## 2015-07-25 NOTE — Progress Notes (Signed)
PULMONARY / CRITICAL CARE MEDICINE   Name: Meghan Yu MRN: [id number] DOB: October 16, 1932    ADMISSION DATE:  07/24/2015  REFERRING MD :  ER  CHIEF COMPLAINT:  Shortness of Breath  INITIAL PRESENTATION: 81yo female presented with cough, dyspnea, weakness, incontinence from PNA, sepsis Radiation for meningioma  STUDIES:  CXR: LLL PNA  SIGNIFICANT EVENTS: 10/03 Admit, VDRF 10/3- shock, pressors  SUBJECTIVE: 2.9 lit pos Pressors remain  VITAL SIGNS: Temp:  [97.5 F (36.4 C)-98.8 F (37.1 C)] 98.6 F (37 C) (10/04 0349) Pulse Rate:  [97-146] 97 (10/04 0700) Resp:  [12-42] 22 (10/04 0700) BP: (65-153)/(39-88) 95/50 mmHg (10/04 0700) SpO2:  [89 %-99 %] 98 % (10/04 0700) FiO2 (%):  [40 %-100 %] 80 % (10/04 0400) Weight:  [133 lb 13.1 oz (60.7 kg)-140 lb 10.5 oz (63.8 kg)] 140 lb 10.5 oz (63.8 kg) (10/04 0600) HEMODYNAMICS: CVP:  [7 mmHg-11 mmHg] 8 mmHg VENTILATOR SETTINGS: Vent Mode:  [-] PRVC FiO2 (%):  [40 %-100 %] 80 % Set Rate:  [22 bmp] 22 bmp Vt Set:  [440 mL] 440 mL PEEP:  [5 cmH20] 5 cmH20 Plateau Pressure:  [20 cmH20-21 cmH20] 20 cmH20 INTAKE / OUTPUT:  Intake/Output Summary (Last 24 hours) at 07/25/15 0754 Last data filed at 07/25/15 0700  Gross per 24 hour  Intake 3811.47 ml  Output    680 ml  Net 3131.47 ml    PHYSICAL EXAMINATION: General: ill appearing Neuro: rass -3 deep HEENT: Pupils reactive 3 mm, dry mucosa Cardiovascular: S1 S2 Regular, tachycardic Lungs: coarse Abdomen: Soft, non tender, BS wnl Musculoskeletal: no edema Skin: No rashes  LABS:  CBC  Recent Labs Lab 07/21/15 1300 07/24/15 1331 07/25/15 0421  WBC 22.8* 28.0* 33.8*  HGB 14.9 15.4* 12.0  HCT 43.3 45.3 35.4*  PLT 156 131* 152   Coag's No results for input(s): APTT, INR in the last 168 hours. BMET  Recent Labs Lab 07/21/15 1300 07/24/15 1331 07/25/15 0421  NA 135 135 134*  K 4.8 3.6 3.9  CL 102 100* 107  CO2 22 22 18*  BUN 33* 23* 24*  CREATININE  0.79 1.30* 1.24*  GLUCOSE 162* 169* 222*   Electrolytes  Recent Labs Lab 07/21/15 1300 07/24/15 1331 07/25/15 0421  CALCIUM 9.2 8.5* 6.9*  MG  --   --  1.3*  PHOS  --   --  4.2   Sepsis Markers  Recent Labs Lab 07/24/15 1855 07/24/15 2048 07/25/15 0422  LATICACIDVEN 3.5* 3.2* 3.3*  PROCALCITON  --  27.18  --    ABG  Recent Labs Lab 07/24/15 1810 07/25/15 0403  PHART 7.297* 7.262*  PCO2ART 32.8* 34.7*  PO2ART 55.0* 91.0   Liver Enzymes  Recent Labs Lab 07/24/15 1331  AST 32  ALT 20  ALKPHOS 71  BILITOT 0.9  ALBUMIN 2.9*   Cardiac Enzymes  Recent Labs Lab 07/24/15 1554  TROPONINI 0.03   Glucose  Recent Labs Lab 07/21/15 1246 07/25/15 0004 07/25/15 0348  GLUCAP 152* 183* 195*    Imaging Dg Chest Port 1 View  07/25/2015   CLINICAL DATA:  Hypoxia/respiratory failure  EXAM: PORTABLE CHEST 1 VIEW  COMPARISON:  July 24, 2015  FINDINGS: Endotracheal tube tip is 1.4 cm above the carina. Central catheter tip is at the junction of the left innominate vein and superior vena cava. Nasogastric tube tip and side port are below the diaphragm. No pneumothorax. There is airspace consolidation in the left lower lobe with small left effusion. Lungs elsewhere clear.  Heart size and pulmonary vascularity are normal. No adenopathy.  IMPRESSION: Tube and catheter positions as described without pneumothorax. Left lower lobe consolidation with left effusion. Lungs elsewhere clear.   Electronically Signed   By: Lowella Grip III M.D.   On: 07/25/2015 07:16   Dg Chest Portable 1 View  07/24/2015   CLINICAL DATA:  Brain tumor receiving treatment, weakness at home, near syncopal episode  EXAM: PORTABLE CHEST 1 VIEW  COMPARISON:  Portable exam 1721 hours compared to 1317 hours  FINDINGS: Tip of endotracheal tube projects 3.1 cm above carina.  Nasogastric tube extends into stomach.  LEFT jugular central venous catheter tip projects over SVC.  Slight rotation to the RIGHT.   Normal heart size mediastinal contours.  Atelectasis versus consolidation in LEFT lower lobe with associated LEFT basilar effusion.  Remaining lungs clear.  No pneumothorax.  IMPRESSION: Line and tube positions as above.  LEFT pleural effusion with atelectasis versus consolidation in LEFT lower lobe.   Electronically Signed   By: Lavonia Dana M.D.   On: 07/24/2015 17:34   Dg Chest Port 1 View  07/24/2015   CLINICAL DATA:  Shortness of breath, weakness, near syncopal episode  EXAM: PORTABLE CHEST 1 VIEW  COMPARISON:  07/21/2015  FINDINGS: New hazy opacity over the left lower chest obscuring the left hemidiaphragm compatible with left basilar airspace process and associated left pleural effusion layering posteriorly. Basilar pneumonia not excluded. Right lung remains clear. Mild cardiac enlargement with normal vascularity. No superimposed edema. Negative for pneumothorax. Monitor leads overlie the chest. Bones are osteopenic.  IMPRESSION: New left basilar opacity and likely posterior layering effusion. Appearance compatible with pneumonia.  Recommend radiographic follow-up to document resolution.   Electronically Signed   By: Jerilynn Mages.  Shick M.D.   On: 07/24/2015 13:34     ASSESSMENT / PLAN:  PULMONARY ETT 10/03 >> A: Acute respiratory failure 2nd to HCAP / cap with concern for aspiration. P:  ABG reviewed, Ph noted, ensure at 8 cc/kg, rate 30 , abg to follow See Pao2 90, fio2 to 60%, if desat then peep increase pcxr in am for LLL If able to 60% , consider SBT, pending abg review, some concerns MV high  CARDIOVASCULAR IJ CVL 10/03 >> A:  Septic shock. Hx of HLD. cvp 7 Adrenal Insuff P:  Continue IV fluids F/u lactic acid, flat, see ID Pressors as needed to maintain MAP > 65 cvp 7, LA noted, re bolus , repeat LA Levophed if to 5 then dc vasopressin Add florinef Echo assessment   RENAL A:  AKI >> baseline creatinine 0.79 from 07/21/15. AKI, ATN likely Acidosis, NONAG hypomag  P:   Monitor renal fx, urine outpt Bicarb, maintain  Bolus for cvp Gram supp  GASTROINTESTINAL A:  Dysphagia. P:  NPO Protonix for SUP Start T F  HEMATOLOGIC A:  Leukocytosis. P:  F/u CBC SQ heparin for DVT prevention  INFECTIOUS A:  Septic shock 2nd to HCAP with possible aspiration., LLL Concern LA, PCT flat, WBC  - source urine?? missing P:  10/2 vancomycin 10/2 zosyn>>>10/4  Blood 10/03 >> Urine 10/03 >> Sputum 10/03 >>   STAT renal US Change zosyn to meropenem Repeat UA  ENDOCRINE A:  Hx of hypothyroidism. Chronic steroid use. Goal glu NICE 140-180 Hyperglycemia. P:  Continue synthroid Stress dose steroids SSI Add florinef  NEUROLOGIC A:  Acute encephalopathy 2nd to sepsis. Hx of seizures. Hx of meningioma. P:  RASS goal: -1 Continue keppra, but mayhave been dc'ed in July,  will d/w family Meropenem needed Complete WUA , dc sedation Portable head CT  FAMILY  - Inter-disciplinary family meet or Palliative Care meeting due by:  10/102016  Algis Greenhouse. Jerline Pain, [place] Resident PGY-2 07/25/2015 7:54 AM    STAFF NOTE: Linwood Dibbles, MD FACP have personally reviewed patient's available data, including medical history, events of note, physical examination and test results as part of my evaluation. I have discussed with resident/NP and other care providers such as pharmacist, RN and RRT. In addition, I personally evaluated patient and elicited key findings of: ronchi LLL, source is a concern for me, concern LA, PCT, WBC, change zosyn to meropenem, assess UA, get US renals, gram neg rods just back, dc vanc, echo for veg, levo to 5, dc vaso, add florinef, get ct head portable, family participated in rounds, all ? Answered, vent changes to fio2 goal 60%, may need peep 8, rate to 30 as T vto 8 cc/kg, maintain bicarb for non AG The patient is critically ill with multiple organ systems failure and requires high  complexity decision making for assessment and support, frequent evaluation and titration of therapies, application of advanced monitoring technologies and extensive interpretation of multiple databases.   Critical Care Time devoted to patient care services described in this note is 35 Minutes. This time reflects time of care of this signee: Merrie Roof, MD FACP. This critical care time does not reflect procedure time, or teaching time or supervisory time of PA/NP/Med student/Med Resident etc but could involve care discussion time. Rest per NP/medical resident whose note is outlined above and that I agree with   Lavon Paganini. Titus Mould, MD, [place] Pgr: [facility] 07/25/2015 9:38 AM

## 2015-07-26 ENCOUNTER — Ambulatory Visit
Admission: RE | Admit: 2015-07-26 | Discharge: 2015-07-26 | Disposition: A | Discharge status: Home / Self Care | Payer: Medicare Other | Source: Ambulatory Visit | Attending: Radiation Oncology | Admitting: Radiation Oncology

## 2015-07-26 ENCOUNTER — Ambulatory Visit (HOSPITAL_COMMUNITY): Payer: Medicare Other

## 2015-07-26 ENCOUNTER — Inpatient Hospital Stay (HOSPITAL_COMMUNITY): Payer: Medicare Other

## 2015-07-26 DIAGNOSIS — R509 Fever, unspecified: Secondary | ICD-10-CM

## 2015-07-26 DIAGNOSIS — J189 Pneumonia, unspecified organism: Secondary | ICD-10-CM | POA: Insufficient documentation

## 2015-07-26 LAB — BASIC METABOLIC PANEL
ANION GAP: 9 (ref 5–15)
Anion gap: 8 (ref 5–15)
BUN: 19 mg/dL (ref 6–20)
BUN: 12 mg/dL (ref 6–20)
CHLORIDE: 101 mmol/L (ref 101–111)
CHLORIDE: 100 mmol/L — AB (ref 101–111)
CO2: 25 mmol/L (ref 22–32)
CO2: 28 mmol/L (ref 22–32)
Calcium: 7 mg/dL — ABNORMAL LOW (ref 8.9–10.3)
Calcium: 7.6 mg/dL — ABNORMAL LOW (ref 8.9–10.3)
Creatinine, Ser: 0.66 mg/dL (ref 0.44–1.00)
Creatinine, Ser: 0.53 mg/dL (ref 0.44–1.00)
GFR calc non Af Amer: >60 mL/min (ref >60)
GLUCOSE: 195 mg/dL — AB (ref 65–99)
Glucose, Bld: 174 mg/dL — ABNORMAL HIGH (ref 65–99)
POTASSIUM: 2.4 mmol/L — AB (ref 3.5–5.1)
POTASSIUM: 3.2 mmol/L — AB (ref 3.5–5.1)
SODIUM: 137 mmol/L (ref 135–145)
Sodium: 134 mmol/L — ABNORMAL LOW (ref 135–145)

## 2015-07-26 LAB — GLUCOSE, CAPILLARY
GLUCOSE-CAPILLARY: 178 mg/dL — AB (ref 65–99)
GLUCOSE-CAPILLARY: 178 mg/dL — AB (ref 65–99)
GLUCOSE-CAPILLARY: 191 mg/dL — AB (ref 65–99)
GLUCOSE-CAPILLARY: 176 mg/dL — AB (ref 65–99)
Glucose-Capillary: 197 mg/dL — ABNORMAL HIGH (ref 65–99)
Glucose-Capillary: 190 mg/dL — ABNORMAL HIGH (ref 65–99)

## 2015-07-26 LAB — POCT I-STAT 3, ART BLOOD GAS (G3+)
Acid-Base Excess: 2 mmol/L (ref 0.0–2.0)
Bicarbonate: 25.7 mEq/L — ABNORMAL HIGH (ref 20.0–24.0)
O2 Saturation: 97 %
PCO2 ART: 33.6 mmHg — AB (ref 35.0–45.0)
PH ART: 7.491 — AB (ref 7.350–7.450)
PO2 ART: 86 mmHg (ref 80.0–100.0)
TCO2: 27 mmol/L (ref 0–100)

## 2015-07-26 LAB — MAGNESIUM
MAGNESIUM: 2.2 mg/dL (ref 1.7–2.4)
MAGNESIUM: 1.8 mg/dL (ref 1.7–2.4)

## 2015-07-26 LAB — PROCALCITONIN: Procalcitonin: 16.89 ng/mL

## 2015-07-26 LAB — CBC
HEMATOCRIT: 27.7 % — AB (ref 36.0–46.0)
HEMOGLOBIN: 9.1 g/dL — AB (ref 12.0–15.0)
MCH: 27.6 pg (ref 26.0–34.0)
MCHC: 32.9 g/dL (ref 30.0–36.0)
MCV: 83.9 fL (ref 78.0–100.0)
Platelets: 72 10*3/uL — ABNORMAL LOW (ref 150–400)
RBC: 3.3 MIL/uL — AB (ref 3.87–5.11)
RDW: 13.7 % (ref 11.5–15.5)
WBC: 10.1 10*3/uL (ref 4.0–10.5)

## 2015-07-26 LAB — PHOSPHORUS
PHOSPHORUS: 1.5 mg/dL — AB (ref 2.5–4.6)
Phosphorus: 1.2 mg/dL — ABNORMAL LOW (ref 2.5–4.6)

## 2015-07-26 MED ORDER — FENTANYL CITRATE (PF) 100 MCG/2ML IJ SOLN
50.0000 ug | INTRAMUSCULAR | Status: DC | PRN
  Administered 2015-07-26 – 2015-07-27 (×5): 100 ug via INTRAVENOUS
  Filled 2015-07-26 (×5): qty 2

## 2015-07-26 MED ORDER — POTASSIUM PHOSPHATES 15 MMOLE/5ML IV SOLN
30.0000 mmol | Freq: Once | INTRAVENOUS | Status: AC
  Administered 2015-07-26: 30 mmol via INTRAVENOUS
  Filled 2015-07-26: qty 10

## 2015-07-26 MED ORDER — POTASSIUM CHLORIDE 20 MEQ/15ML (10%) PO SOLN
40.0000 meq | ORAL | Status: DC
  Administered 2015-07-26: 40 meq
  Filled 2015-07-26 (×4): qty 30

## 2015-07-26 MED ORDER — POTASSIUM CHLORIDE 20 MEQ/15ML (10%) PO SOLN
40.0000 meq | Freq: Once | ORAL | Status: AC
  Administered 2015-07-26: 40 meq
  Filled 2015-07-26: qty 30

## 2015-07-26 MED ORDER — SODIUM CHLORIDE 0.9 % IV SOLN
250.0000 mL | INTRAVENOUS | Status: DC
  Administered 2015-07-26: 250 mL via INTRAVENOUS

## 2015-07-26 MED ORDER — POTASSIUM CHLORIDE 10 MEQ/50ML IV SOLN: 10.0000 meq | INTRAVENOUS | Status: DC

## 2015-07-26 MED ORDER — MEROPENEM 1 G IV SOLR
1.0000 g | Freq: Two times a day (BID) | INTRAVENOUS | Status: DC
  Administered 2015-07-26 – 2015-07-27 (×3): 1 g via INTRAVENOUS
  Filled 2015-07-26 (×4): qty 1

## 2015-07-26 MED ORDER — POTASSIUM CHLORIDE 10 MEQ/50ML IV SOLN
10.0000 meq | INTRAVENOUS | Status: AC
  Administered 2015-07-26 – 2015-07-27 (×5): 10 meq via INTRAVENOUS
  Filled 2015-07-26 (×5): qty 50

## 2015-07-26 MED ORDER — HYDROCORTISONE NA SUCCINATE PF 100 MG IJ SOLR
25.0000 mg | Freq: Three times a day (TID) | INTRAMUSCULAR | Status: DC
  Administered 2015-07-27 (×2): 25 mg via INTRAVENOUS
  Filled 2015-07-26 (×3): qty 0.5

## 2015-07-26 MED ORDER — MAGNESIUM SULFATE 2 GM/50ML IV SOLN
2.0000 g | Freq: Once | INTRAVENOUS | Status: AC
  Administered 2015-07-27: 2 g via INTRAVENOUS
  Filled 2015-07-26: qty 50

## 2015-07-26 NOTE — Clinical Documentation Improvement (Signed)
Critical Care   (please document your response in the progress notes and discharge summary and not on the query form itself.)  To assist with accurate code assignment, please documented if a condition below provides greater specificity regarding the patient's pneumonia:   - Pneumonia secondary to Pseudomonas  - Other pneumonia  - Unable to clinically determine  Clinical Information: Sputum Cx 07/24/15 at 1810 - CULTURE:ABUNDANT \.in30\PSEUDOMONAS AERUGINOSA    Please exercise your independent, professional judgment when responding. A specific answer is not anticipated or expected.   Thank You, Erling Conte  RN BSN CCDS [phone] Health Information Management [place]

## 2015-07-26 NOTE — Progress Notes (Signed)
CRITICAL VALUE ALERT  Critical value received:  K+ 2.4  Date of notification:  07/26/2015  Time of notification:  6606  Critical value read back:Yes.    Nurse who received alert:  Fayrene Helper   MD notified (1st page):  Dr. Elsworth Soho  Time of first page:  0534  MD notified (2nd page):  Time of second page:  Responding MD:  Dr. Elsworth Soho  Time MD responded:  [phone]

## 2015-07-26 NOTE — Progress Notes (Signed)
PULMONARY / CRITICAL CARE MEDICINE   Name: Meghan Yu MRN: [id number] DOB: 12-07-1931    ADMISSION DATE:  07/24/2015  REFERRING MD :  ER  CHIEF COMPLAINT:  Shortness of Breath  INITIAL PRESENTATION: 81yo female presented with cough, dyspnea, weakness, incontinence from PNA, sepsis Radiation for meningioma  STUDIES:  CXR: LLL PNA  SIGNIFICANT EVENTS: 10/03 Admit, VDRF 10/3- shock, pressors  SUBJECTIVE:  Intubated, opens eyes to voice  VITAL SIGNS: Temp:  [98.1 F (36.7 C)-99.2 F (37.3 C)] 99.2 F (37.3 C) (10/05 0401) Pulse Rate:  [65-112] 99 (10/05 0600) Resp:  [16-31] 27 (10/05 0600) BP: (85-152)/(37-74) 133/69 mmHg (10/05 0600) SpO2:  [95 %-100 %] 100 % (10/05 0600) FiO2 (%):  [50 %-80 %] 50 % (10/05 0400) Weight:  [143 lb 15.4 oz (65.3 kg)] 143 lb 15.4 oz (65.3 kg) (10/05 0414) HEMODYNAMICS: CVP:  [7 mmHg-75 mmHg] 59 mmHg VENTILATOR SETTINGS: Vent Mode:  [-] PRVC FiO2 (%):  [50 %-80 %] 50 % Set Rate:  [22 bmp-30 bmp] 30 bmp Vt Set:  [350 mL-440 mL] 350 mL PEEP:  [5 cmH20] 5 cmH20 Plateau Pressure:  [17 cmH20-23 cmH20] 17 cmH20 INTAKE / OUTPUT:  Intake/Output Summary (Last 24 hours) at 07/26/15 0747 Last data filed at 07/26/15 0600  Gross per 24 hour  Intake 3248.07 ml  Output   1565 ml  Net 1683.07 ml    PHYSICAL EXAMINATION: General: ill appearing  Neuro: rass -1 deep HEENT: Pupils reactive 3 mm, dry mucosa Cardiovascular: S1 S2 Regular, tachycardic Lungs: coarse Abdomen: Soft, non tender, BS wnl Musculoskeletal: no edema Skin: No rashes  LABS:  CBC  Recent Labs Lab 07/24/15 1331 07/25/15 0421 07/26/15 0457  WBC 28.0* 33.8* 10.1  HGB 15.4* 12.0 9.1*  HCT 45.3 35.4* 27.7*  PLT 131* 152 72*   Coag's No results for input(s): APTT, INR in the last 168 hours. BMET  Recent Labs Lab 07/24/15 1331 07/25/15 0421 07/26/15 0457  NA 135 134* 134*  K 3.6 3.9 2.4*  CL 100* 107 101  CO2 22 18* 25  BUN 23* 24* 19  CREATININE  1.30* 1.24* 0.66  GLUCOSE 169* 222* 195*   Electrolytes  Recent Labs Lab 07/24/15 1331 07/25/15 0421 07/25/15 1107 07/25/15 2247 07/26/15 0457  CALCIUM 8.5* 6.9*  --   --  7.0*  MG  --  1.3* 1.2* 2.5*  --   PHOS  --  4.2 3.1 1.9*  --    Sepsis Markers  Recent Labs Lab 07/24/15 2048 07/25/15 0421 07/25/15 0422 07/25/15 1121 07/26/15 0457  LATICACIDVEN 3.2*  --  3.3* 2.1*  --   PROCALCITON 27.18 27.91  --   --  16.89   ABG  Recent Labs Lab 07/25/15 0403 07/25/15 0700 07/25/15 1138  PHART 7.262* 7.328* 7.317*  PCO2ART 34.7* 31.4* 34.0*  PO2ART 91.0 91.0 89.0   Liver Enzymes  Recent Labs Lab 07/24/15 1331  AST 32  ALT 20  ALKPHOS 71  BILITOT 0.9  ALBUMIN 2.9*   Cardiac Enzymes  Recent Labs Lab 07/24/15 1554  TROPONINI 0.03   Glucose  Recent Labs Lab 07/25/15 0004 07/25/15 0348 07/25/15 1313 07/25/15 2038 07/26/15 0014 07/26/15 0403  GLUCAP 183* 195* 140* 163* 197* 178*    Imaging US Renal  07/25/2015   CLINICAL DATA:  Acute kidney injury  EXAM: RENAL / URINARY TRACT ULTRASOUND COMPLETE  COMPARISON:  None.  FINDINGS: Right Kidney:  Length: 10.6 cm. Echogenicity within normal limits. No mass or hydronephrosis visualized.  Left  Kidney:  Length: 10.7 cm. Echogenicity within normal limits. No mass or hydronephrosis visualized.  Bladder:  Appears normal for degree of bladder distention.  IMPRESSION: 1. Normal exam.   Electronically Signed   By: Kerby Moors M.D.   On: 07/25/2015 14:10   Dg Chest Port 1 View  07/26/2015   CLINICAL DATA:  Pneumonia.  Shortness of breath.  EXAM: PORTABLE CHEST 1 VIEW  COMPARISON:  07/25/2015.  FINDINGS: Endotracheal tube, NG tube, left IJ line stable position. Heart size normal. Persistent left lower lobe infiltrate left pleural effusion. No pneumothorax.  IMPRESSION: 1. Lines and tubes in stable position. 2. Persistent left lower lobe infiltrate and left pleural effusion.   Electronically Signed   By: [place]    On: 07/26/2015 07:11   Ct Portable Head W/o Cm  07/25/2015   CLINICAL DATA:  Encephalopathy.  EXAM: CT HEAD WITHOUT CONTRAST  TECHNIQUE: Contiguous axial images were obtained from the base of the skull through the vertex without intravenous contrast.  COMPARISON:  CT scan of July 14, 2015.  FINDINGS: Status post left parietal craniotomy. Left frontal encephalomalacia is again noted. Stable meningioma is noted in the right parafalcine region. No mass effect or midline shift is noted. Ventricular size is within normal limits. There is no evidence of hemorrhage or acute infarction. Stable calcified meningioma is noted in right temporal region.  IMPRESSION: Stable right-sided meningioma as an left-sided encephalomalacia is noted compared to prior exam. No significant changes noted compared to prior exam.   Electronically Signed   By: Marijo Conception, M.D.   On: 07/25/2015 21:21     ASSESSMENT / PLAN:  PULMONARY ETT 10/03 >> A: Acute respiratory failure 2nd to HCAP / cap with concern for aspiration. P:  ABG reviewed, Ph noted, ensure at 8 cc/kg, rate 30 , abg to follow pcxr in am, may need to [place] left base for effusion ABg repeat , assess MV needs, prior to sbt Assess alkalosis   CARDIOVASCULAR IJ CVL 10/03 >> A:  Septic shock. Hx of HLD. cvp 10 Adrenal Insuff assumed P:  Continue IV fluids - to kvo F/u lactic acid, flat, see ID Pressors as needed to maintain MAP > 60 Levophed off Dc florinef Echo assessment  Slight reduce steroids  RENAL A:  AKI >> baseline creatinine 0.79 from 07/21/15. AKI, ATN likely Acidosis, NONAG resolved hypophos P:  Monitor renal fx, urine outpt Bicarb, dc kvo Bolus for cvp not needed Gram supp Replete k Repeat bmet in pm   GASTROINTESTINAL A:  Dysphagia. P:  NPO Protonix for SUP T f on going  HEMATOLOGIC A:  Leukocytosis resolved thrombocytopenia Dilutional drops IN CBC  / sepsis P:  F/u CBC SQ heparin hold   Add scd  May need lasix  INFECTIOUS A:  Septic shock 2nd to HCAP with possible aspiration., LLL Improved H/o cdiff P:  10/2 vancomycin>>>10/4 10/2 zosyn>>>10/4 10/4 meropenem>>>  Blood 10/03 >>gram neg rod>>> Urine 10/03 >> Sputum 10/03 >>psuedomonas>>>   renal US neg hydro Repeat UA neg  Follow pseudomonas sens and BC id 14 days required  Repeat BC   No role probiotics ( steroid dep, radiation), lack of benefit  ENDOCRINE A:  Hx of hypothyroidism. Chronic steroid use. Goal glu NICE 140-180 Hyperglycemia. P:  Continue synthroid Stress dose steroids reduce, in am to decadron SSI Dc florinef  NEUROLOGIC A:  Acute encephalopathy 2nd to sepsis. Hx of seizures. Hx of meningioma. P:  RASS goal: -1 Meropenem needed, no seizure  noted Complete WUA on going Portable head CT- reviewed  FAMILY  - Inter-disciplinary family meet or Palliative Care meeting due by:  10/102016  Algis Greenhouse. Jerline Pain, [place] Resident PGY-2 07/26/2015 7:47 AM    STAFF NOTE: Linwood Dibbles, MD FACP have personally reviewed patient's available data, including medical history, events of note, physical examination and test results as part of my evaluation. I have discussed with resident/NP and other care providers such as pharmacist, RN and RRT. In addition, I personally evaluated patient and elicited key findings of: more awake, alert, follows commands, vent is improved, abg reveals alkalosis, reduce rate vent, then sbt, off pressors, kvo, pseudomonas noted, will need 14 days, no role probiotic, K, phos reaplcement, repeat labs in pm, follow BCD ID, repeat BC, CT reviewed, family updated at bedside, improved WBC, pct, la, will address code status The patient is critically ill with multiple organ systems failure and requires high complexity decision making for assessment and support, frequent evaluation and titration of therapies, application of advanced  monitoring technologies and extensive interpretation of multiple databases.   Critical Care Time devoted to patient care services described in this note is30 Minutes. This time reflects time of care of this signee: Merrie Roof, MD FACP. This critical care time does not reflect procedure time, or teaching time or supervisory time of PA/NP/Med student/Med Resident etc but could involve care discussion time. Rest per NP/medical resident whose note is outlined above and that I agree with   Lavon Paganini. Titus Mould, MD, [place] Pgr: [facility] 07/26/2015 10:22 AM

## 2015-07-26 NOTE — Progress Notes (Signed)
[facility] ICU Electrolyte Replacement Protocol  Patient Name: Meghan Yu DOB: 1932/03/04 MRN: [id number]  Date of Service  07/26/2015   HPI/Events of Note    Recent Labs Lab 07/21/15 1300 07/24/15 1331 07/25/15 0421 07/25/15 1107 07/25/15 2247 07/26/15 0457  NA 135 135 134*  --   --  134*  K 4.8 3.6 3.9  --   --  2.4*  CL 102 100* 107  --   --  101  CO2 22 22 18*  --   --  25  GLUCOSE 162* 169* 222*  --   --  195*  BUN 33* 23* 24*  --   --  19  CREATININE 0.79 1.30* 1.24*  --   --  0.66  CALCIUM 9.2 8.5* 6.9*  --   --  7.0*  MG  --   --  1.3* 1.2* 2.5*  --   PHOS  --   --  4.2 3.1 1.9*  --     Estimated Creatinine Clearance: 45.3 mL/min (by C-G formula based on Cr of 0.66).  Intake/Output      10/04 0701 - 10/05 0700   I.V. (mL/kg) 2398.1 (36.7)   NG/GT 425   IV Piggyback 300   Total Intake(mL/kg) 3123.1 (47.8)   Urine (mL/kg/hr) 1435 (0.9)   Total Output 1435   Net +1688.1        - I/O DETAILED x24h    Total I/O In: 1435.1 [I.V.:1035.1; NG/GT:250; IV Piggyback:150] Out: 635 [Urine:635] - I/O THIS SHIFT    ASSESSMENT   eICURN Interventions   ICU Electrolyte Replacement Protocol criteria met. Labs Replaced per protocol. MD notified    ASSESSMENT: MAJOR ELECTROLYTE    Meghan Yu 07/26/2015, 5:40 AM

## 2015-07-26 NOTE — Progress Notes (Signed)
  Echocardiogram 2D Echocardiogram has been performed.  Meghan Yu 07/26/2015, 2:38 PM

## 2015-07-27 ENCOUNTER — Inpatient Hospital Stay (HOSPITAL_COMMUNITY): Payer: Medicare Other

## 2015-07-27 DIAGNOSIS — J151 Pneumonia due to Pseudomonas: Secondary | ICD-10-CM

## 2015-07-27 LAB — GLUCOSE, CAPILLARY
GLUCOSE-CAPILLARY: 148 mg/dL — AB (ref 65–99)
GLUCOSE-CAPILLARY: 233 mg/dL — AB (ref 65–99)
Glucose-Capillary: 189 mg/dL — ABNORMAL HIGH (ref 65–99)
Glucose-Capillary: 165 mg/dL — ABNORMAL HIGH (ref 65–99)

## 2015-07-27 LAB — MAGNESIUM
Magnesium: 2.7 mg/dL — ABNORMAL HIGH (ref 1.7–2.4)
Magnesium: 1.9 mg/dL (ref 1.7–2.4)

## 2015-07-27 LAB — BASIC METABOLIC PANEL
ANION GAP: 5 (ref 5–15)
ANION GAP: 10 (ref 5–15)
BUN: 12 mg/dL (ref 6–20)
BUN: 11 mg/dL (ref 6–20)
CALCIUM: 8.1 mg/dL — AB (ref 8.9–10.3)
CHLORIDE: 99 mmol/L — AB (ref 101–111)
CO2: 31 mmol/L (ref 22–32)
CO2: 29 mmol/L (ref 22–32)
Calcium: 7.5 mg/dL — ABNORMAL LOW (ref 8.9–10.3)
Chloride: 95 mmol/L — ABNORMAL LOW (ref 101–111)
Creatinine, Ser: 0.49 mg/dL (ref 0.44–1.00)
Creatinine, Ser: 0.56 mg/dL (ref 0.44–1.00)
GFR calc Af Amer: >60 mL/min (ref >60)
GFR calc non Af Amer: >60 mL/min (ref >60)
GLUCOSE: 176 mg/dL — AB (ref 65–99)
Glucose, Bld: 249 mg/dL — ABNORMAL HIGH (ref 65–99)
POTASSIUM: 3.9 mmol/L (ref 3.5–5.1)
Potassium: 4.8 mmol/L (ref 3.5–5.1)
Sodium: 135 mmol/L (ref 135–145)
Sodium: 134 mmol/L — ABNORMAL LOW (ref 135–145)

## 2015-07-27 LAB — CULTURE, BLOOD (ROUTINE X 2)

## 2015-07-27 LAB — CULTURE, RESPIRATORY W GRAM STAIN

## 2015-07-27 LAB — PHOSPHORUS
Phosphorus: 4 mg/dL (ref 2.5–4.6)
Phosphorus: 3.1 mg/dL (ref 2.5–4.6)

## 2015-07-27 LAB — C DIFFICILE QUICK SCREEN W PCR REFLEX
C DIFFICILE (CDIFF) INTERP: NEGATIVE
C DIFFICILE (CDIFF) TOXIN: NEGATIVE
C DIFFICLE (CDIFF) ANTIGEN: NEGATIVE

## 2015-07-27 LAB — CBC
HEMATOCRIT: 27.4 % — AB (ref 36.0–46.0)
HEMOGLOBIN: 9.3 g/dL — AB (ref 12.0–15.0)
MCH: 29.1 pg (ref 26.0–34.0)
MCHC: 33.9 g/dL (ref 30.0–36.0)
MCV: 85.6 fL (ref 78.0–100.0)
Platelets: 78 10*3/uL — ABNORMAL LOW (ref 150–400)
RBC: 3.2 MIL/uL — ABNORMAL LOW (ref 3.87–5.11)
RDW: 13.9 % (ref 11.5–15.5)
WBC: 9.9 10*3/uL (ref 4.0–10.5)

## 2015-07-27 LAB — CULTURE, RESPIRATORY

## 2015-07-27 MED ORDER — DEXTROSE 5 % IV SOLN
2.0000 g | Freq: Two times a day (BID) | INTRAVENOUS | Status: DC
  Administered 2015-07-27 – 2015-08-01 (×10): 2 g via INTRAVENOUS
  Filled 2015-07-27 (×12): qty 2

## 2015-07-27 MED ORDER — FUROSEMIDE 10 MG/ML IJ SOLN
20.0000 mg | Freq: Two times a day (BID) | INTRAMUSCULAR | Status: DC
  Administered 2015-07-27 – 2015-07-30 (×6): 20 mg via INTRAVENOUS
  Filled 2015-07-27 (×2): qty 2
  Filled 2015-07-27: qty 4
  Filled 2015-07-27 (×2): qty 2
  Filled 2015-07-27 (×3): qty 4

## 2015-07-27 MED ORDER — MAGNESIUM SULFATE 2 GM/50ML IV SOLN
2.0000 g | Freq: Once | INTRAVENOUS | Status: AC
  Administered 2015-07-27: 2 g via INTRAVENOUS
  Filled 2015-07-27: qty 50

## 2015-07-27 MED ORDER — POTASSIUM PHOSPHATES 15 MMOLE/5ML IV SOLN
30.0000 mmol | Freq: Once | INTRAVENOUS | Status: AC
  Administered 2015-07-27: 30 mmol via INTRAVENOUS
  Filled 2015-07-27: qty 10

## 2015-07-27 MED ORDER — POTASSIUM CHLORIDE 20 MEQ/15ML (10%) PO SOLN
30.0000 meq | ORAL | Status: AC
  Administered 2015-07-27 (×2): 30 meq
  Filled 2015-07-27 (×2): qty 30

## 2015-07-27 MED ORDER — FENTANYL CITRATE (PF) 100 MCG/2ML IJ SOLN
25.0000 ug | INTRAMUSCULAR | Status: DC | PRN
  Administered 2015-07-27 – 2015-07-28 (×2): 25 ug via INTRAVENOUS
  Filled 2015-07-27 (×2): qty 2

## 2015-07-27 MED ORDER — ETOMIDATE 2 MG/ML IV SOLN
INTRAVENOUS | Status: AC
  Administered 2015-07-27: 20 mg
  Filled 2015-07-27: qty 10

## 2015-07-27 MED ORDER — DEXAMETHASONE SODIUM PHOSPHATE 4 MG/ML IJ SOLN
4.0000 mg | Freq: Three times a day (TID) | INTRAMUSCULAR | Status: DC
  Administered 2015-07-27 – 2015-07-29 (×7): 4 mg via INTRAVENOUS
  Filled 2015-07-27 (×11): qty 1

## 2015-07-27 NOTE — Progress Notes (Signed)
PULMONARY / CRITICAL CARE MEDICINE   Name: Meghan Yu MRN: [id number] DOB: 1932-05-05    ADMISSION DATE:  07/24/2015  REFERRING MD :  ER  CHIEF COMPLAINT:  Shortness of Breath  INITIAL PRESENTATION: 81yo female presented with cough, dyspnea, weakness, incontinence from PNA, sepsis Radiation for meningioma  STUDIES:  CXR: LLL PNA  SIGNIFICANT EVENTS: 10/03 Admit, VDRF 10/3- shock, pressors 10/5- awake alert, cooperative  SUBJECTIVE:  Intubated, opens eyes to voice  VITAL SIGNS: Temp:  [97.9 F (36.6 C)-100.2 F (37.9 C)] 98.2 F (36.8 C) (10/06 0810) Pulse Rate:  [86-139] 100 (10/06 0700) Resp:  [20-31] 23 (10/06 0700) BP: (108-210)/(53-104) 146/78 mmHg (10/06 0700) SpO2:  [92 %-98 %] 98 % (10/06 0700) FiO2 (%):  [50 %] 50 % (10/06 0400) Weight:  [150 lb 5.7 oz (68.2 kg)] 150 lb 5.7 oz (68.2 kg) (10/06 0328) HEMODYNAMICS: CVP:  [5 mmHg-68 mmHg] 5 mmHg VENTILATOR SETTINGS: Vent Mode:  [-] PRVC FiO2 (%):  [50 %] 50 % Set Rate:  [22 bmp-30 bmp] 22 bmp Vt Set:  [350 mL] 350 mL PEEP:  [5 cmH20] 5 cmH20 Pressure Support:  [5 cmH20] 5 cmH20 Plateau Pressure:  [12 cmH20-18 cmH20] 16 cmH20 INTAKE / OUTPUT:  Intake/Output Summary (Last 24 hours) at 07/27/15 0817 Last data filed at 07/27/15 0700  Gross per 24 hour  Intake 1765.17 ml  Output   2310 ml  Net -544.83 ml    PHYSICAL EXAMINATION: General: ill appearing  Neuro: rass -1, more awake now HEENT: Pupils reactive 3 mm, dry mucosa Cardiovascular: S1 S2 Regular, tachycardic Lungs: coarse unchanged Abdomen: Soft, non tender, BS wnl Musculoskeletal: no edema Skin: No rashes  LABS:  CBC  Recent Labs Lab 07/25/15 0421 07/26/15 0457 07/27/15 0610  WBC 33.8* 10.1 9.9  HGB 12.0 9.1* 9.3*  HCT 35.4* 27.7* 27.4*  PLT 152 72* 78*   Coag's No results for input(s): APTT, INR in the last 168 hours. BMET  Recent Labs Lab 07/26/15 0457 07/26/15 2045 07/27/15 0610  NA 134* 137 135  K 2.4* 3.2*  3.9  CL 101 100* 99*  CO2 25 28 31   BUN 19 12 12   CREATININE 0.66 0.53 0.49  GLUCOSE 195* 174* 176*   Electrolytes  Recent Labs Lab 07/25/15 2247 07/26/15 0457 07/26/15 0859 07/26/15 2045 07/27/15 0610  CALCIUM  --  7.0*  --  7.6* 7.5*  MG 2.5*  --  2.2 1.8  --   PHOS 1.9*  --  1.2* 1.5*  --    Sepsis Markers  Recent Labs Lab 07/24/15 2048 07/25/15 0421 07/25/15 0422 07/25/15 1121 07/26/15 0457  LATICACIDVEN 3.2*  --  3.3* 2.1*  --   PROCALCITON 27.18 27.91  --   --  16.89   ABG  Recent Labs Lab 07/25/15 0700 07/25/15 1138 07/26/15 1039  PHART 7.328* 7.317* 7.491*  PCO2ART 31.4* 34.0* 33.6*  PO2ART 91.0 89.0 86.0   Liver Enzymes  Recent Labs Lab 07/24/15 1331  AST 32  ALT 20  ALKPHOS 71  BILITOT 0.9  ALBUMIN 2.9*   Cardiac Enzymes  Recent Labs Lab 07/24/15 1554  TROPONINI 0.03   Glucose  Recent Labs Lab 07/26/15 0403 07/26/15 0804 07/26/15 1148 07/26/15 1613 07/26/15 1942 07/26/15 2358  GLUCAP 178* 178* 191* 190* 176* 148*    Imaging No results found.   ASSESSMENT / PLAN:  PULMONARY ETT 10/03 >> A: Acute respiratory failure 2nd to HCAP / cap with concern for aspiration. P:  No new ABG Consider  pcxr assessment  SBT on going supp phos  CARDIOVASCULAR IJ CVL 10/03 >> A:  Septic shock. Hx of HLD. cvp 10 Adrenal Insuff assumed P:  Tele Neg balance Echo  - reviewed Back home decadron  RENAL A:  AKI >> baseline creatinine 0.79 from 07/21/15. AKI, ATN likely Acidosis, NONAG resolved hypophos P:  k phos now, as weaning well bmet , mg, phos in pm an dam  lasix addition to goal neg 2 liters  GASTROINTESTINAL A:  Dysphagia. P:  NPO Protonix for SUP T f on going, may need to hold if PS to 5  HEMATOLOGIC A:  Leukocytosis resolved thrombocytopenia Dilutional drops IN CBC  / sepsis P:  F/u CBC SQ heparin hold  Add scd  Add lasix  INFECTIOUS A:  Septic shock 2nd to HCAP with possible  aspiration., LLL Improved H/o cdiff P:  10/2 vancomycin>>>10/4 10/2 zosyn>>>10/4 10/4 meropenem>>>10/6 ceftaz 10/4>>>  Blood 10/03 >>pseudo ( pan sens) Urine 10/03 >> Sputum 10/03 >>psuedomonas>>> pan sens  cdiff neg thus far Change to ceftaz, add stop date total 14 days Dc line as able  ENDOCRINE A:  Hx of hypothyroidism. Chronic steroid use. Goal glu NICE 140-180 Hyperglycemia. P:  Continue synthroid to decadron SSI  NEUROLOGIC A:  Acute encephalopathy 2nd to sepsis. Hx of seizures. Hx of meningioma. P:  RASS goal: 0 Complete WUA Portable head CT- reviewed Limit fent when able sensitive   FAMILY  - Inter-disciplinary family meet or Palliative Care meeting due by:  10/102016  Algis Greenhouse. Jerline Pain, [place] Resident PGY-2 07/27/2015 8:17 AM   STAFF NOTE: Linwood Dibbles, MD FACP have personally reviewed patient's available data, including medical history, events of note, physical examination and test results as part of my evaluation. I have discussed with resident/NP and other care providers such as pharmacist, RN and RRT. In addition, I personally evaluated patient and elicited key findings of: awakens well, coarse BS, get pcxr, correct phos , no extubation until phos started , reassess lytes in pm and am , lasix addition, weaning PS 10, goal 5, likley with her body wt can have ps 5 with TV 5 cc/kg, rate 30 less, will clarify reintubation status, narrow to ceftaz, family participated in rounds = I have had extensive discussions with family son and daughter. We discussed patients current circumstances and organ failures. We also discussed patient's prior wishes under circumstances such as this. Family has decided to NOT perform resuscitation if arrest but to continue current medical support for now. DNI  The patient is critically ill with multiple organ systems failure and requires high complexity decision making for assessment and  support, frequent evaluation and titration of therapies, application of advanced monitoring technologies and extensive interpretation of multiple databases.   Critical Care Time devoted to patient care services described in this note is 30 Minutes. This time reflects time of care of this signee: Merrie Roof, MD FACP. This critical care time does not reflect procedure time, or teaching time or supervisory time of PA/NP/Med student/Med Resident etc but could involve care discussion time. Rest per NP/medical resident whose note is outlined above and that I agree with   Lavon Paganini. Titus Mould, MD, [place] Pgr: [facility] 07/27/2015 11:23 AM

## 2015-07-27 NOTE — Care Management Note (Signed)
Case Management Note  Patient Details  Name: Meghan Yu MRN: [id number] Date of Birth: Jul 03, 1932  Subjective/Objective:        Son, daughter, grandaughter and grandson in room.  Patient remains intubated, opens eyes and moves hand in mit when waved at her and said hello.  Daughter states she lives with her.  Uses a walker to get around and has been independent mostly.  Cooks her own food - simple while daughter at work.  Has a walk in tub also.  Has [facility] for PT at home.              Action/Plan:   Expected Discharge Date:                  Expected Discharge Plan:  [place]  In-House Referral:     Discharge planning Services  CM Consult  Post Acute Care Choice:    Choice offered to:     DME Arranged:    DME Agency:     HH Arranged:  PT HH Agency:  [facility]  Status of Service:  In process, will continue to follow  Medicare Important Message Given:    Date Medicare IM Given:    Medicare IM give by:    Date Additional Medicare IM Given:    Additional Medicare Important Message give by:     If discussed at [place] of Stay Meetings, dates discussed:    Additional Comments:  Vergie Living, RN 07/27/2015, 10:32 AM

## 2015-07-27 NOTE — Procedures (Signed)
Extubation Procedure Note  Patient Details:   Name: Meghan Yu DOB: 1932/10/16 MRN: [id number]   Airway Documentation:  Extubated to 50% venturi mask per MD order.  No complications, patient tolerated well.    Evaluation  O2 sats: stable throughout Complications: No apparent complications Patient did tolerate procedure well. Bilateral Breath Sounds: Diminished Suctioning: Airway Yes  Janit Cutter P 07/27/2015, 8:16 PM

## 2015-07-27 NOTE — Procedures (Signed)
Bronchoscopy Procedure Note Doreather Hoxworth [id number] 04/23/1932  Procedure: Bronchoscopy Indications: Diagnostic evaluation of the airways  Procedure Details Consent: Risks of procedure as well as the alternatives and risks of each were explained to the (patient/caregiver).  Consent for procedure obtained. Time Out: Verified patient identification, verified procedure, site/side was marked, verified correct patient position, special equipment/implants available, medications/allergies/relevent history reviewed, required imaging and test results available.  Performed  In preparation for procedure, patient was given 100% FiO2. Sedation: Etomidate  Airway entered and the following bronchi were examined: RUL, RML, RLL, LUL, LLL and Bronchi.   Procedures performed: Brushings performed - none Bronchoscope removed.  , Patient placed back on 100% FiO2 at conclusion of procedure.    Evaluation Hemodynamic Status: BP stable throughout; O2 sats: stable throughout Patient's Current Condition: stable Specimens:  None Complications: No apparent complications Patient did tolerate procedure well.   Raylene Miyamoto. 07/27/2015   1. Blood int LLL, suctioned cleared 2. Friable orifice LLL, mild edema 3. No lesions 4. No ulcerations from suction trauma  likley necrotizing PNA pseudomonas  Lavon Paganini. Titus Mould, MD, [place] Pgr: [facility]

## 2015-07-27 NOTE — Progress Notes (Signed)
eLink Physician-Brief Progress Note Patient Name: Sareen Randon DOB: June 02, 1932 MRN: [id number]   Date of Service  07/27/2015  HPI/Events of Note  Patient desires to have ETT out. Spoke with son, Neesha Langton, who understands and supports his mother's decision. The plan we have discussed is to extubate the patient to [facility]/[place] O2 when remaining family members arrive. If her clinical condition deteriorates, she will be made comfort measures and a Narcotic IV infusion and Versed PRN employed to keep the patient comfortable. I have also spoken to Dr. Titus Mould who has no issues with this plan.   eICU Interventions  Bedside nurse to call when family ready for extubation order.      Intervention Category Major Interventions: Respiratory failure - evaluation and management  Sommer,Steven Eugene 07/27/2015, 6:09 PM

## 2015-07-27 NOTE — Progress Notes (Signed)
CRITICAL VALUE ALERT  Critical value received:  Phos < 1  Date of notification:  07/27/15  Time of notification:  1165  Critical value read back:Yes.    Nurse who received alert:  Nettie Elm, RN  MD notified (1st page):  Dimas Chyle, MD  Time of first page:  1057  MD notified (2nd page):  Time of second page:  Responding MD:  Dimas Chyle  Time MD responded:  1057

## 2015-07-27 NOTE — Progress Notes (Signed)
[facility] ADULT ICU REPLACEMENT PROTOCOL FOR AM LAB REPLACEMENT ONLY  The patient does apply for the [facility] Adult ICU Electrolyte Replacment Protocol based on the criteria listed below:   1. Is GFR >/= 40 ml/min? Yes.    Patient's GFR today is >60 2. Is urine output >/= 0.5 ml/kg/hr for the last 6 hours? Yes.   Patient's UOP is 2.9 ml/kg/hr 3. Is BUN < 60 mg/dL? Yes.    Patient's BUN today is 28 4. Abnormal electrolyte(s):K3.2,Mg1.8, Phos1.5 5. Ordered repletion with: per protocol 6. If a panic level lab has been reported, has the CCM MD in charge been notified? Yes.  .   Physician:  Mortimer Fries MD  [place] Clock 07/27/2015 6:33 AM

## 2015-07-27 NOTE — Progress Notes (Signed)
C. Diff negative.  Enteric precautions d/c'd.

## 2015-07-27 NOTE — Clinical Documentation Improvement (Addendum)
Critical Care  (please document your response in the progress notes and discharge summary, not on the query form itself.)  Coders are not allowed, per coding guidelines, to interpret a sputum culture showing Pseudomonas Aeruginosa as "Pseudomonas Pneumonia".  Therefore, in order to assist with accurate code assignment,  please document if a condition below provides greater specificity regarding the patient's pneumonia:  - Pneumonia secondary to Pseudomonas - Other pneumonia - Unable to clinically determine  Clinical Information: Sputum Cx 07/24/15 at [place] \.in30\PSEUDOMONAS AERUGINOSA    Please exercise your independent, professional judgment when responding. A specific answer is not anticipated or expected.   Thank You, Erling Conte RN BSN CCDS [phone] Health Information Management [place]

## 2015-07-27 NOTE — Progress Notes (Signed)
Pt having loose, watery stools.  >3 occurences today.  Stool sent for C. Diff per MD.   Pt placed on enteric precautions.  Family not available to educate.  Will monitor pt.

## 2015-07-27 NOTE — Progress Notes (Addendum)
ANTIBIOTIC CONSULT NOTE - INITIAL  Pharmacy Consult for ceftazidime Indication: pseudomonal bacteremia/PNA  No Known Allergies  Patient Measurements: Height: 4\' 11"  (149.9 cm) Weight: 150 lb 5.7 oz (68.2 kg) IBW/kg (Calculated) : 43.2 Adjusted Body Weight: 63  Vital Signs: Temp: 98.2 F (36.8 C) (10/06 0810) Temp Source: Axillary (10/06 0810) BP: 147/71 mmHg (10/06 1100) Pulse Rate: 100 (10/06 1100) Intake/Output from previous day: 10/05 0701 - 10/06 0700 In: 1910.2 [I.V.:340.2; NG/GT:1070; IV Piggyback:500] Out: 2410 [Urine:2410] Intake/Output from this shift: Total I/O In: 320 [I.V.:40; NG/GT:180; IV Piggyback:100] Out: 230 [Urine:230]  Labs:  Recent Labs  07/25/15 0421 07/26/15 0457 07/26/15 2045 07/27/15 0610  WBC 33.8* 10.1  --  9.9  HGB 12.0 9.1*  --  9.3*  PLT 152 72*  --  78*  CREATININE 1.24* 0.66 0.53 0.49   Estimated Creatinine Clearance: 46.3 mL/min (by C-G formula based on Cr of 0.49). No results for input(s): VANCOTROUGH, VANCOPEAK, VANCORANDOM, GENTTROUGH, GENTPEAK, GENTRANDOM, TOBRATROUGH, TOBRAPEAK, TOBRARND, AMIKACINPEAK, AMIKACINTROU, AMIKACIN in the last 72 hours.   Microbiology: Recent Results (from the past 720 hour(s))  Urine culture     Status: None   Collection Time: 07/21/15  5:22 PM  Result Value Ref Range Status   Specimen Description URINE, CLEAN CATCH  Final   Special Requests NONE  Final   Culture >=100,000 COLONIES/mL ESCHERICHIA COLI  Final   Report Status 07/24/2015 FINAL  Final   Organism ID, Bacteria ESCHERICHIA COLI  Final      Susceptibility   Escherichia coli - MIC*    AMPICILLIN >=32 RESISTANT Resistant     CEFAZOLIN <=4 SENSITIVE Sensitive     CEFTRIAXONE <=1 SENSITIVE Sensitive     CIPROFLOXACIN 1 SENSITIVE Sensitive     GENTAMICIN <=1 SENSITIVE Sensitive     IMIPENEM <=0.25 SENSITIVE Sensitive     NITROFURANTOIN <=16 SENSITIVE Sensitive     TRIMETH/SULFA <=20 SENSITIVE Sensitive     AMPICILLIN/SULBACTAM 16  INTERMEDIATE Intermediate     PIP/TAZO <=4 SENSITIVE Sensitive     * >=100,000 COLONIES/mL ESCHERICHIA COLI  Blood Culture (routine x 2)     Status: None (Preliminary result)   Collection Time: 07/24/15  1:32 PM  Result Value Ref Range Status   Specimen Description BLOOD LEFT FOREARM  Final   Special Requests BOTTLES DRAWN AEROBIC AND ANAEROBIC 5CC  Final   Culture  Setup Time   Final    GRAM NEGATIVE RODS AEROBIC BOTTLE ONLY CRITICAL RESULT CALLED TO, READ BACK BY AND VERIFIED WITH: MINDY HOPPER @0451  07/25/15 MKELLY    Culture   Final    PSEUDOMONAS AERUGINOSA SUSCEPTIBILITIES PERFORMED ON PREVIOUS CULTURE WITHIN THE LAST 5 DAYS.    Report Status PENDING  Incomplete  Blood Culture (routine x 2)     Status: None   Collection Time: 07/24/15  1:35 PM  Result Value Ref Range Status   Specimen Description BLOOD RIGHT ARM  Final   Special Requests BOTTLES DRAWN AEROBIC AND ANAEROBIC 5CC  Final   Culture  Setup Time   Final    GRAM NEGATIVE RODS AEROBIC BOTTLE ONLY CRITICAL RESULT CALLED TO, READ BACK BY AND VERIFIED WITH: MINDY HOPPER @0451  07/25/15 MKELLY    Culture PSEUDOMONAS AERUGINOSA  Final   Report Status 07/27/2015 FINAL  Final   Organism ID, Bacteria PSEUDOMONAS AERUGINOSA  Final      Susceptibility   Pseudomonas aeruginosa - MIC*    CEFTAZIDIME 4 SENSITIVE Sensitive     CIPROFLOXACIN <=0.25 SENSITIVE Sensitive  GENTAMICIN <=1 SENSITIVE Sensitive     IMIPENEM 2 SENSITIVE Sensitive     PIP/TAZO 8 SENSITIVE Sensitive     CEFEPIME 2 SENSITIVE Sensitive     * PSEUDOMONAS AERUGINOSA  Urine culture     Status: None   Collection Time: 07/24/15  5:07 PM  Result Value Ref Range Status   Specimen Description URINE, CATHETERIZED  Final   Special Requests NONE  Final   Culture NO GROWTH 1 DAY  Final   Report Status 07/25/2015 FINAL  Final  Culture, respiratory (NON-Expectorated)     Status: None   Collection Time: 07/24/15  6:10 PM  Result Value Ref Range Status    Specimen Description TRACHEAL ASPIRATE  Final   Special Requests Immunocompromised  Final   Gram Stain   Final    ABUNDANT WBC PRESENT, PREDOMINANTLY PMN RARE SQUAMOUS EPITHELIAL CELLS PRESENT NO ORGANISMS SEEN Performed at [facility]    Culture   Final    ABUNDANT PSEUDOMONAS AERUGINOSA Performed at [facility]    Report Status 07/27/2015 FINAL  Final   Organism ID, Bacteria PSEUDOMONAS AERUGINOSA  Final      Susceptibility   Pseudomonas aeruginosa - MIC*    CEFEPIME 4 SENSITIVE Sensitive     CEFTAZIDIME 4 SENSITIVE Sensitive     CIPROFLOXACIN <=0.25 SENSITIVE Sensitive     GENTAMICIN <=1 SENSITIVE Sensitive     IMIPENEM 2 SENSITIVE Sensitive     PIP/TAZO 8 SENSITIVE Sensitive     TOBRAMYCIN <=1 SENSITIVE Sensitive     * ABUNDANT PSEUDOMONAS AERUGINOSA  MRSA PCR Screening     Status: None   Collection Time: 07/24/15  7:18 PM  Result Value Ref Range Status   MRSA by PCR NEGATIVE NEGATIVE Final    Comment:        The GeneXpert MRSA Assay (FDA approved for NASAL specimens only), is one component of a comprehensive MRSA colonization surveillance program. It is not intended to diagnose MRSA infection nor to guide or monitor treatment for MRSA infections.   Culture, blood (routine x 2)     Status: None (Preliminary result)   Collection Time: 07/26/15 12:30 PM  Result Value Ref Range Status   Specimen Description BLOOD RIGHT ANTECUBITAL  Final   Special Requests BOTTLES DRAWN AEROBIC AND ANAEROBIC 5CC  Final   Culture PENDING  Incomplete   Report Status PENDING  Incomplete  C difficile quick scan w PCR reflex     Status: None   Collection Time: 07/26/15 11:59 PM  Result Value Ref Range Status   C Diff antigen NEGATIVE NEGATIVE Final   C Diff toxin NEGATIVE NEGATIVE Final   C Diff interpretation Negative for toxigenic C. difficile  Final    Medical History: Past Medical History  Diagnosis Date  . Hyperlipidemia   . Anxiety disorder   . Arthritis  of knee, right     Needs replacement/Dr. Sharol Given  . Colon polyps     colonoscopy by Dr. Wynetta Emery  . Brain cancer ([place]) 03/30/13    Left frontal meningioma  . Hypothyroidism     hypothyroidism  . UTI (lower urinary tract infection) 2014    HX e. coli  . Degenerative arthritis of hand     bilateral  . Sepsis ([place])   . SVT (supraventricular tachycardia) ([employer]) 2014    Medications:  Scheduled:  . antiseptic oral rinse  7 mL Mouth Rinse QID  . cefTAZidime (FORTAZ)  IV  2 g Intravenous Q12H  . chlorhexidine  gluconate  15 mL Mouth Rinse BID  . dexamethasone  4 mg Intravenous TID  . fluconazole (DIFLUCAN) IV  100 mg Intravenous QHS  . furosemide  20 mg Intravenous BID  . Influenza vac split quadrivalent PF  0.5 mL Intramuscular Tomorrow-1000  . insulin aspart  0-9 Units Subcutaneous 6 times per day  . levothyroxine  37.5 mcg Intravenous Daily  . midazolam  4 mg Intravenous Once  . pantoprazole (PROTONIX) IV  40 mg Intravenous QHS  . potassium phosphate IVPB (mmol)  30 mmol Intravenous Once   Assessment: 81 yo presented to the ED with weakness and near syncope. Started vancomycin and zosyn in the ED for sepsis. Recently seen on 9/30 for a UTI and given fosfomycin*1.   Pt is afebrile, WBC were significantly elevated on admission and have now trended down to 10. Scr wnl (baseline ~1).  Merrem 10/5>> 10/6  Ceftazidime 10/6>>10/15  Vanc 10/3>>10/4  Zosyn 10/3>>10/4   Blood 10/03 >> 2/2 pseudomonas (pan sensitive)  Urine 10/03 >> negF  Sputum 10/03 >> pseudomonas  Plan:  Switch from Merrem to ceftazidime 2 gm IV Q 12 hours Monitor renal fxn and adjust dosing accordingly F/u cultures, CBC, signs/symptoms of infection   Melburn Popper, PharmD Clinical Pharmacy Resident Pager: [phone] 07/27/2015 11:36 AM

## 2015-07-27 NOTE — Progress Notes (Signed)
[facility] Progress Note Patient Name: Meghan Yu DOB: 05-20-1932 MRN: [id number]   Date of Service  07/27/2015  HPI/Events of Note  Family and patient now ready for extubation.  eICU Interventions  Will extubate to 50% Ventimask O2.      Intervention Category Major Interventions: Respiratory failure - evaluation and management  [name] 07/27/2015, 7:28 PM

## 2015-07-28 DIAGNOSIS — A419 Sepsis, unspecified organism: Secondary | ICD-10-CM | POA: Diagnosis not present

## 2015-07-28 LAB — BASIC METABOLIC PANEL
ANION GAP: 11 (ref 5–15)
BUN: 14 mg/dL (ref 6–20)
CALCIUM: 8.2 mg/dL — AB (ref 8.9–10.3)
CO2: 31 mmol/L (ref 22–32)
Chloride: 96 mmol/L — ABNORMAL LOW (ref 101–111)
Creatinine, Ser: 0.5 mg/dL (ref 0.44–1.00)
Glucose, Bld: 164 mg/dL — ABNORMAL HIGH (ref 65–99)
POTASSIUM: 4.2 mmol/L (ref 3.5–5.1)
Sodium: 138 mmol/L (ref 135–145)

## 2015-07-28 LAB — CBC
HCT: 31 % — ABNORMAL LOW (ref 36.0–46.0)
Hemoglobin: 10.3 g/dL — ABNORMAL LOW (ref 12.0–15.0)
MCH: 28.5 pg (ref 26.0–34.0)
MCHC: 33.2 g/dL (ref 30.0–36.0)
MCV: 85.9 fL (ref 78.0–100.0)
PLATELETS: 106 10*3/uL — AB (ref 150–400)
RBC: 3.61 MIL/uL — AB (ref 3.87–5.11)
RDW: 14.1 % (ref 11.5–15.5)
WBC: 8.1 10*3/uL (ref 4.0–10.5)

## 2015-07-28 LAB — GLUCOSE, CAPILLARY
GLUCOSE-CAPILLARY: 208 mg/dL — AB (ref 65–99)
GLUCOSE-CAPILLARY: 266 mg/dL — AB (ref 65–99)
Glucose-Capillary: 208 mg/dL — ABNORMAL HIGH (ref 65–99)
Glucose-Capillary: 270 mg/dL — ABNORMAL HIGH (ref 65–99)

## 2015-07-28 LAB — CULTURE, BLOOD (ROUTINE X 2)

## 2015-07-28 LAB — MAGNESIUM: Magnesium: 1.8 mg/dL (ref 1.7–2.4)

## 2015-07-28 MED ORDER — ACETAMINOPHEN 160 MG/5ML PO SOLN
650.0000 mg | Freq: Four times a day (QID) | ORAL | Status: DC | PRN
  Filled 2015-07-28 (×2): qty 20.3

## 2015-07-28 MED ORDER — DEXTROSE 5 % IV SOLN: 2.0000 g | Freq: Once | INTRAVENOUS | Status: DC

## 2015-07-28 MED ORDER — LEVOTHYROXINE SODIUM 50 MCG PO TABS
75.0000 ug | ORAL_TABLET | Freq: Every day | ORAL | Status: DC
  Administered 2015-07-29 – 2015-08-02 (×5): 75 ug via ORAL
  Filled 2015-07-28 (×10): qty 1

## 2015-07-28 MED ORDER — MAGNESIUM SULFATE 2 GM/50ML IV SOLN
2.0000 g | Freq: Once | INTRAVENOUS | Status: AC
  Administered 2015-07-28: 2 g via INTRAVENOUS
  Filled 2015-07-28: qty 50

## 2015-07-28 MED ORDER — PANTOPRAZOLE SODIUM 40 MG PO TBEC
40.0000 mg | DELAYED_RELEASE_TABLET | Freq: Every day | ORAL | Status: DC
  Administered 2015-07-28 – 2015-08-01 (×5): 40 mg via ORAL
  Filled 2015-07-28 (×5): qty 1

## 2015-07-28 MED ORDER — ACETAMINOPHEN 325 MG PO TABS
650.0000 mg | ORAL_TABLET | Freq: Four times a day (QID) | ORAL | Status: DC | PRN
  Administered 2015-07-28: 650 mg via ORAL
  Filled 2015-07-28: qty 2

## 2015-07-28 MED ORDER — ALPRAZOLAM 0.5 MG PO TABS: 1.0000 mg | ORAL_TABLET | Freq: Two times a day (BID) | ORAL | Status: DC | PRN

## 2015-07-28 MED ORDER — ACETAMINOPHEN 325 MG PO TABS: 650.0000 mg | ORAL_TABLET | Freq: Four times a day (QID) | ORAL | Status: DC | PRN

## 2015-07-28 MED ORDER — ALPRAZOLAM 0.5 MG PO TABS
1.0000 mg | ORAL_TABLET | Freq: Two times a day (BID) | ORAL | Status: DC | PRN
  Administered 2015-07-28 – 2015-08-01 (×5): 1 mg via ORAL
  Filled 2015-07-28 (×5): qty 2

## 2015-07-28 MED ORDER — RESOURCE THICKENUP CLEAR PO POWD
ORAL | Status: DC | PRN
  Filled 2015-07-28: qty 125

## 2015-07-28 NOTE — Progress Notes (Signed)
  Radiation Oncology         [phone] ________________________________  Name: Meghan Yu MRN: [id number]  Date: 07/18/2015  DOB: [birth year]  End of Treatment Note DIAGNOSIS:    ICD-9-CM ICD-10-CM   1. 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 Dexamethasone was started for decline in gait and possible peritumoral edema.  She developed dysgeusia and poor PO intake.  IV fluids were provided at the end of treatment for supportive care.  Plan: The patient has completed radiation treatment. The patient will return to radiation oncology clinic for routine followup in <1 week. I advised them to call or return sooner if they have any questions or concerns related to their recovery or treatment.  -----------------------------------  Eppie Gibson, MD  This document serves as a record of services personally performed by Eppie Gibson, MD. It was created on her behalf by Derek Mound, a trained medical scribe. The creation of this record is based on the scribe's personal observations and the provider's statements to them. This document has been checked and approved by the attending provider.

## 2015-07-28 NOTE — Progress Notes (Signed)
Report called to Juliette Mangle, RN 70m. pts family at bedside. Will transport pt in bed to m/s unit

## 2015-07-28 NOTE — Evaluation (Signed)
Physical Therapy Evaluation Patient Details Name: Meghan Yu MRN: [id number] DOB: 07/11/1932 Today's Date: 07/28/2015   History of Present Illness  81 yo female presented with cough, dyspnea, weakness, incontinence from PNA, sepsis. Radiation for meningioma. MD reports necrotizing pneumonia. Pt intubated from 10/3 to 10/6. Now DNR/DNI.   Clinical Impression  Pt admitted with the above diagnosis. Pt currently with functional limitations due to the deficits listed below (see PT Problem List). Meghan Yu was very motivated and eager to get OOB with therapy today. Able to stand twice with mod assist and pivot to recliner. VSS throughout treatment on 2L supplemental O2. Greatest difficulty with balance and mobility appears to be due to RLE weakness, specifically at the ankle which the daughter reports began the day prior to admission. Family very supportive and provide 24/7 supervision at home. [name] was ambulatory with a rolling walker PTA and needed some assist with bath and dress. Pt will benefit from skilled PT to increase their independence and safety with mobility to allow discharge to the venue listed below.       Follow Up Recommendations CIR (Pending progression and tolerance to therapy)    Equipment Recommendations   (TBD)    Recommendations for Other Services Rehab consult;OT consult     Precautions / Restrictions Precautions Precautions: Sternal Precaution Comments: monitor O2 Restrictions Weight Bearing Restrictions: No      Mobility  Bed Mobility Overal bed mobility: Needs Assistance Bed Mobility: Rolling;Sidelying to Sit Rolling: Min assist Sidelying to sit: Mod assist;HOB elevated       General bed mobility comments: Min assist for LEs to come off of bed. Pt able to reach and pull with RUE on rail to assist with roll onto side. Mod assist for truncal support while transitioning to seated position. VC for technique throughout.  Transfers Overall transfer level:  Needs assistance Equipment used: Rolling walker (2 wheeled) Transfers: Sit to/from [facility] Sit to Stand: Mod assist Stand pivot transfers: Mod assist       General transfer comment: Mod assist for boost to stand with RW for UE support. VC for hand placement and tactile facilitation to bring hips forward. Able to performed x2 from bed. Mod assist with pivot to chair, demonstrating difficulty bearing weight through RLE fully while attempting to lift LLE. Assist for balace and to pivot pt and RW. Cues throughout. She was able to stand and march feet BIL several times during first stand but was likely fatigued during pivot.  Ambulation/Gait                Stairs            Wheelchair Mobility    Modified Rankin (Stroke Patients Only) Modified Rankin (Stroke Patients Only) Pre-Morbid Rankin Score: Moderately severe disability Modified Rankin: Moderately severe disability     Balance Overall balance assessment: Needs assistance Sitting-balance support: No upper extremity supported;Feet supported Sitting balance-Leahy Scale: Fair Sitting balance - Comments: Practiced weight shifting onto Lt and right elbow several times. Pt demonstrates Rt lateral lean (apparently baseline per daughter.) Demonstrates good ability to return to midline without physical assist. States she feels as if she is falling towards her left when in midline position. Postural control: Right lateral lean Standing balance support: Bilateral upper extremity supported Standing balance-Leahy Scale: Poor Standing balance comment: Practiced upright posture in standing, tolerated approximately 30 seconds first attempt, 1 minute second attempt with pivot transfer to chair. Slight right lateral lean of trunk noted.  Pertinent Vitals/Pain Pain Assessment: No/denies pain    Home Living Family/patient expects to be discharged to:: Unsure Living  Arrangements: Children (Lives with daughter Marit) Available Help at Discharge: Family;Available 24 hours/day Type of Home: Mobile home Home Access: Stairs to enter Entrance Stairs-Rails: Right;Left Entrance Stairs-Number of Steps: 5 Home Layout: One level Home Equipment: Shower seat - built in;Walker - 2 wheels;Bedside commode      Prior Function Level of Independence: Needs assistance   Gait / Transfers Assistance Needed: household distances with RW  ADL's / Homemaking Assistance Needed: Needed slight assist with dressing and more assist with bath.        Hand Dominance   Dominant Hand: Right    Extremity/Trunk Assessment   Upper Extremity Assessment: Defer to OT evaluation           Lower Extremity Assessment: RLE deficits/detail;LLE deficits/detail RLE Deficits / Details: 0/5 ankle strength. Rt hip/knee approx -4/4. Notably weaker than LLE grossly LLE Deficits / Details: >4/5 grossly     Communication   Communication: Other (comment) (very soft voice)  Cognition Arousal/Alertness: Awake/alert Behavior During Therapy: Flat affect Overall Cognitive Status: Difficult to assess (Follows commands appropriately, verbalizes name)                      General Comments General comments (skin integrity, edema, etc.): Daughter present and hopeful pt can go to rehab. HR to 120 after standing, avg in 90s. SpO2 92-98% on 2L supplemental O2.    Exercises General Exercises - Lower Extremity Ankle Circles/Pumps: AROM;Both;10 reps;Supine (passive on Rt) Quad Sets: Strengthening;Both;5 reps;Seated Short Arc Quad: Strengthening;Both;5 reps;Supine Long Arc Quad: Strengthening;Both;5 reps;Seated Heel Slides: Strengthening;Both;5 reps;Seated      Assessment/Plan    PT Assessment Patient needs continued PT services  PT Diagnosis Difficulty walking;Generalized weakness   PT Problem List Decreased strength;Decreased range of motion;Decreased activity  tolerance;Decreased balance;Decreased mobility;Decreased knowledge of use of DME;Cardiopulmonary status limiting activity  PT Treatment Interventions DME instruction;Gait training;Functional mobility training;Therapeutic activities;Therapeutic exercise;Balance training;Neuromuscular re-education;Patient/family education   PT Goals (Current goals can be found in the Care Plan section) Acute Rehab PT Goals Patient Stated Goal: None stated PT Goal Formulation: With family [place], daughter) Time For Goal Achievement: 08/11/15 Potential to Achieve Goals: Fair    Frequency Min 3X/week   Barriers to discharge        Co-evaluation               End of Session Equipment Utilized During Treatment: Gait belt;Oxygen Activity Tolerance: Patient tolerated treatment well Patient left: in chair;with call bell/phone within reach;with family/visitor present;with nursing/sitter in room;with SCD's reapplied Nurse Communication: Mobility status;Precautions         Time: 4098-1191 PT Time Calculation (min) (ACUTE ONLY): 43 min   Charges:   PT Evaluation $Initial PT Evaluation Tier I: 1 Procedure PT Treatments $Therapeutic Exercise: 8-22 mins $Therapeutic Activity: 8-22 mins   PT G Codes:        Ellouise Newer 07/28/2015, 5:40 PM Camille Bal Hugo, [place]

## 2015-07-28 NOTE — Progress Notes (Signed)
PULMONARY / CRITICAL CARE MEDICINE   Name: Geneva Barrero MRN: [id number] DOB: 03/06/1932    ADMISSION DATE:  07/24/2015  REFERRING MD :  ER  CHIEF COMPLAINT:  Shortness of Breath  INITIAL PRESENTATION: 81yo female presented with cough, dyspnea, weakness, incontinence from PNA, sepsis Radiation for meningioma  STUDIES:  CXR: LLL PNA  SIGNIFICANT EVENTS: 10/03 Admit, VDRF 10/3- shock, pressors 10/5- awake alert, cooperative 10/6- bronch LLL blood old friable, c/w necrotizing PNA 10/6- extubated per pt wishes  SUBJECTIVE:  No distress  VITAL SIGNS: Temp:  [96.7 F (35.9 C)-100 F (37.8 C)] 98.3 F (36.8 C) (10/07 0405) Pulse Rate:  [69-132] 69 (10/07 0600) Resp:  [20-34] 22 (10/07 0600) BP: (119-178)/(60-112) 134/62 mmHg (10/07 0600) SpO2:  [92 %-99 %] 96 % (10/07 0600) FiO2 (%):  [40 %-50 %] 40 % (10/06 2000) Weight:  [62.8 kg (138 lb 7.2 oz)] 62.8 kg (138 lb 7.2 oz) (10/07 0500) HEMODYNAMICS:   VENTILATOR SETTINGS: Vent Mode:  [-] PRVC FiO2 (%):  [40 %-50 %] 40 % Set Rate:  [22 bmp] 22 bmp Vt Set:  [350 mL] 350 mL PEEP:  [5 cmH20] 5 cmH20 Pressure Support:  [10 cmH20] 10 cmH20 Plateau Pressure:  [17 cmH20] 17 cmH20 INTAKE / OUTPUT:  Intake/Output Summary (Last 24 hours) at 07/28/15 0715 Last data filed at 07/28/15 0600  Gross per 24 hour  Intake   1085 ml  Output   3130 ml  Net  -2045 ml    PHYSICAL EXAMINATION: General: ill appearing  Neuro: awake, alert , chronic weakness rt HEENT: Pupils reactive Cardiovascular: S1 S2 Regular rr Lungs: CTA Abdomen: Soft, non tender, BS wnl Musculoskeletal: no edema Skin: No rashes  LABS:  CBC  Recent Labs Lab 07/26/15 0457 07/27/15 0610 07/28/15 0247  WBC 10.1 9.9 8.1  HGB 9.1* 9.3* 10.3*  HCT 27.7* 27.4* 31.0*  PLT 72* 78* 106*   Coag's No results for input(s): APTT, INR in the last 168 hours. BMET  Recent Labs Lab 07/27/15 0610 07/27/15 1929 07/28/15 0247  NA 135 134* 138  K 3.9 4.8  4.2  CL 99* 95* 96*  CO2 31 29 31   BUN 12 11 14   CREATININE 0.49 0.56 0.50  GLUCOSE 176* 249* 164*   Electrolytes  Recent Labs Lab 07/27/15 0610 07/27/15 1014 07/27/15 1929 07/27/15 2200 07/28/15 0247  CALCIUM 7.5*  --  8.1*  --  8.2*  MG  --  2.7*  --  1.9 1.8  PHOS  --  <1.0* 4.0 3.1  --    Sepsis Markers  Recent Labs Lab 07/24/15 2048 07/25/15 0421 07/25/15 0422 07/25/15 1121 07/26/15 0457  LATICACIDVEN 3.2*  --  3.3* 2.1*  --   PROCALCITON 27.18 27.91  --   --  16.89   ABG  Recent Labs Lab 07/25/15 0700 07/25/15 1138 07/26/15 1039  PHART 7.328* 7.317* 7.491*  PCO2ART 31.4* 34.0* 33.6*  PO2ART 91.0 89.0 86.0   Liver Enzymes  Recent Labs Lab 07/24/15 1331  AST 32  ALT 20  ALKPHOS 71  BILITOT 0.9  ALBUMIN 2.9*   Cardiac Enzymes  Recent Labs Lab 07/24/15 1554  TROPONINI 0.03   Glucose  Recent Labs Lab 07/26/15 1942 07/26/15 2358 07/27/15 0809 07/27/15 1544 07/27/15 1948 07/27/15 2333  GLUCAP 176* 148* 189* 165* 233* 208*    Imaging Dg Chest Port 1 View  07/27/2015   CLINICAL DATA:  Volume excess  EXAM: PORTABLE CHEST 1 VIEW  COMPARISON:  07/26/2015  FINDINGS: Endotracheal  tube, left IJ central line, and nasogastric tube are in place as before.  Heart contours are obscured. Bibasilar opacities appear stable. Bilateral pleural effusions and bilateral lower lobe opacities are present, left greater than right.  IMPRESSION: Bilateral pleural effusions and bibasilar opacity, left greater than right.   Electronically Signed   By: Nolon Nations M.D.   On: 07/27/2015 13:36     ASSESSMENT / PLAN:  PULMONARY ETT 10/03 >> A: ARF Necrotizing PNA Resolving bleeding LLL P:  DNI O2 as needed IS as able Neg balance as able  CARDIOVASCULAR IJ CVL 10/03 >> A:  Septic shock. Hx of HLD. cvp 10 Adrenal Insuff assumed P:  Tele Neg balance as renal fxn tolerates Echo  - reviewed home decadron  RENAL A:  AKI >> baseline  creatinine 0.79 from 07/21/15. AKI, ATN likely Acidosis, NONAG resolved hypophos P:  bmet in am  Maintain lasix Mag supp kvo  GASTROINTESTINAL A:  Dysphagia  likely (family supports) P:  NPO maintain Protonix for SUP SLP ordered  HEMATOLOGIC A:  Leukocytosis resolved thrombocytopenia Dilutional drops improved with lasix P:  F/u CBC SQ heparin re add likely if plat remains over 100 k in am  scd  lasix  INFECTIOUS A:  Septic shock 2nd to HCAP with possible aspiration., LLL - necrtozing Improved H/o cdiff P:  10/2 vancomycin>>>10/4 10/2 zosyn>>>10/4 10/4 meropenem>>>10/6 ceftaz 10/4>>>  Blood 10/03 >>pseudo ( pan sens) Urine 10/03 >> Sputum 10/03 >>psuedomonas>>> pan sens  Extend ABX to 14 days , may need longer, reluctant with h/o cdiff  ENDOCRINE A:  Hx of hypothyroidism. Chronic steroid use. Goal glu NICE 140-180 Hyperglycemia. P:  Continue synthroid to decadron SSI When diet, add lantus likley  NEUROLOGIC A:  Acute encephalopathy 2nd to sepsis. Hx of seizures. Hx of meningioma. P:  RASS goal: 0 SLP PT consult  FAMILY  - Inter-disciplinary family meet or Palliative Care meeting due by:  10/102016  To med floor, triad DNR / DNI, no NIMV   Lavon Paganini. Titus Mould, MD, [place] Pgr: [facility]

## 2015-07-28 NOTE — Progress Notes (Signed)
eLink Physician-Brief Progress Note Patient Name: Shelli Portilla DOB: [birth year] MRN: [id number]   Date of Service  07/28/2015  HPI/Events of Note  Requesting PRN xanax (home med) and tablet tylenol for pain  eICU Interventions  Orders written for above     Intervention Category Minor Interventions: Routine modifications to care plan (e.g. PRN medications for pain, fever)  DETERDING,ELIZABETH 07/28/2015, 10:39 PM

## 2015-07-28 NOTE — Evaluation (Signed)
Clinical/Bedside Swallow Evaluation Patient Details  Name: Meghan Yu MRN: [id number] Date of Birth: 1932-10-21  Today's Date: 07/28/2015 Time: SLP Start Time (ACUTE ONLY): 81 SLP Stop Time (ACUTE ONLY): 0940 SLP Time Calculation (min) (ACUTE ONLY): 20 min  Past Medical History:  Past Medical History  Diagnosis Date  . Hyperlipidemia   . Anxiety disorder   . Arthritis of knee, right     Needs replacement/Dr. Sharol Given  . Colon polyps     colonoscopy by Dr. Wynetta Emery  . Brain cancer ([place]) 03/30/13    Left frontal meningioma  . Hypothyroidism     hypothyroidism  . UTI (lower urinary tract infection) 2014    HX e. coli  . Degenerative arthritis of hand     bilateral  . Sepsis ([place])   . SVT (supraventricular tachycardia) ([place]) 2014   Past Surgical History:  Past Surgical History  Procedure Laterality Date  . Knee arthroscopy    . Cholecystectomy    . Abdominal hysterectomy    . Craniotomy Left 03/30/2013    Procedure: LEFT FRONTAL CRANIOTOMY FOR RESECTION OF MENINGIOMA;  Surgeon: Kristeen Miss, MD;  Location: [facility];  Service: Neurosurgery;  Laterality: Left;  Left Frontal Craniotomy for tumor excision   HPI:  81yo female presented with cough, dyspnea, weakness, incontinence from PNA, sepsis. Radiation for meningioma. MD reprots necrotizing pneumonia. Family reports dysphagia during radiation treatment due to severe thrush.  Pt intubated from 10/3 to 10/6. Now DNR/DNI.    Assessment / Plan / Recommendation Clinical Impression  Pt demonstrates signs concerning for acute reversible dysphagia following intubation, though otherwise swallow function appears strong and timely. Pt is aphonic with delayed cough following trials of liquids. Nectar thick liquids and soft solids were tolerated without difficulty. Per daughter thrush cause pt to struggle to eat and drink prior to admit, though this now appears resolved and pt reports desire to eat "regular foods". Expect pt to upgrade from  nectar thick liquids with more time post extubation. Will order Dys 3/nectar with full supervision from staff or family. SLP will follow for tolerance and upgrade.     Aspiration Risk  Moderate    Diet Recommendation Dysphagia 3 (Mech soft);Nectar   Medication Administration: Whole meds with liquid Compensations: Small sips/bites    Other  Recommendations Oral Care Recommendations: Oral care BID   Follow Up Recommendations       Frequency and Duration        Pertinent Vitals/Pain NA    SLP Swallow Goals     Swallow Study Prior Functional Status       General Other Pertinent Information: 81yo female presented with cough, dyspnea, weakness, incontinence from PNA, sepsis. Radiation for meningioma. MD reprots necrotizing pneumonia. Family reports dysphagia during radiation treatment due to severe thrush.  Pt intubated from 10/3 to 10/6. Now DNR/DNI.  Type of Study: Bedside swallow evaluation Previous Swallow Assessment: none Diet Prior to this Study: NPO Temperature Spikes Noted: No Respiratory Status: Supplemental O2 delivered via (comment) History of Recent Intubation: Yes Length of Intubations (days):  (4) Date extubated: 07/27/15 Behavior/Cognition: Alert Oral Cavity - Dentition: Adequate natural dentition/normal for age Self-Feeding Abilities: Needs assist Patient Positioning: Upright in bed Baseline Vocal Quality: Aphonic Volitional Cough: Congested Volitional Swallow: Able to elicit    Oral/Motor/Sensory Function Overall Oral Motor/Sensory Function: Appears within functional limits for tasks assessed   Ice Chips     Thin Liquid Thin Liquid: Impaired Presentation: Cup Pharyngeal  Phase Impairments: Cough - Delayed  Nectar Thick Nectar Thick Liquid: Within functional limits Presentation: Cup;Straw   Honey Thick Honey Thick Liquid: Not tested   Puree Puree: Within functional limits   Solid   GO    Solid: Within functional limits      Meghan [place], MA  CCC-SLP [phone]  Adreona Brand, Katherene Ponto 07/28/2015,9:59 AM

## 2015-07-29 DIAGNOSIS — J9601 Acute respiratory failure with hypoxia: Secondary | ICD-10-CM

## 2015-07-29 LAB — CBC WITH DIFFERENTIAL/PLATELET
BASOS ABS: 0 10*3/uL (ref 0.0–0.1)
Basophils Relative: 0 %
EOS PCT: 0 %
Eosinophils Absolute: 0 10*3/uL (ref 0.0–0.7)
HCT: 31.7 % — ABNORMAL LOW (ref 36.0–46.0)
Hemoglobin: 10.5 g/dL — ABNORMAL LOW (ref 12.0–15.0)
LYMPHS PCT: 7 %
Lymphs Abs: 0.6 10*3/uL — ABNORMAL LOW (ref 0.7–4.0)
MCH: 28.5 pg (ref 26.0–34.0)
MCHC: 33.1 g/dL (ref 30.0–36.0)
MCV: 86.1 fL (ref 78.0–100.0)
MONO ABS: 0.2 10*3/uL (ref 0.1–1.0)
Monocytes Relative: 2 %
Neutro Abs: 7.3 10*3/uL (ref 1.7–7.7)
Neutrophils Relative %: 91 %
PLATELETS: 114 10*3/uL — AB (ref 150–400)
RBC: 3.68 MIL/uL — ABNORMAL LOW (ref 3.87–5.11)
RDW: 13.6 % (ref 11.5–15.5)
WBC: 8 10*3/uL (ref 4.0–10.5)

## 2015-07-29 LAB — BASIC METABOLIC PANEL
Anion gap: 10 (ref 5–15)
BUN: 22 mg/dL — ABNORMAL HIGH (ref 6–20)
CALCIUM: 8.5 mg/dL — AB (ref 8.9–10.3)
CO2: 29 mmol/L (ref 22–32)
CREATININE: 0.56 mg/dL (ref 0.44–1.00)
Chloride: 96 mmol/L — ABNORMAL LOW (ref 101–111)
GFR calc non Af Amer: >60 mL/min (ref >60)
Glucose, Bld: 192 mg/dL — ABNORMAL HIGH (ref 65–99)
Potassium: 3.9 mmol/L (ref 3.5–5.1)
SODIUM: 135 mmol/L (ref 135–145)

## 2015-07-29 LAB — GLUCOSE, CAPILLARY
GLUCOSE-CAPILLARY: 314 mg/dL — AB (ref 65–99)
Glucose-Capillary: 146 mg/dL — ABNORMAL HIGH (ref 65–99)
Glucose-Capillary: 216 mg/dL — ABNORMAL HIGH (ref 65–99)
Glucose-Capillary: 337 mg/dL — ABNORMAL HIGH (ref 65–99)
Glucose-Capillary: 178 mg/dL — ABNORMAL HIGH (ref 65–99)

## 2015-07-29 LAB — PHOSPHORUS: PHOSPHORUS: 3 mg/dL (ref 2.5–4.6)

## 2015-07-29 LAB — MAGNESIUM: MAGNESIUM: 2 mg/dL (ref 1.7–2.4)

## 2015-07-29 MED ORDER — ENOXAPARIN SODIUM 40 MG/0.4ML ~~LOC~~ SOLN
40.0000 mg | SUBCUTANEOUS | Status: DC
  Administered 2015-07-29 – 2015-08-01 (×4): 40 mg via SUBCUTANEOUS
  Filled 2015-07-29 (×4): qty 0.4

## 2015-07-29 MED ORDER — DEXAMETHASONE 4 MG PO TABS
4.0000 mg | ORAL_TABLET | Freq: Three times a day (TID) | ORAL | Status: DC
  Administered 2015-07-29 – 2015-08-02 (×12): 4 mg via ORAL
  Filled 2015-07-29 (×12): qty 1

## 2015-07-29 MED ORDER — METOPROLOL TARTRATE 50 MG PO TABS
50.0000 mg | ORAL_TABLET | Freq: Every day | ORAL | Status: DC
  Administered 2015-07-29: 50 mg via ORAL
  Filled 2015-07-29: qty 1

## 2015-07-29 NOTE — Progress Notes (Signed)
Physical Therapy Treatment Patient Details Name: Meghan Yu MRN: [id number] DOB: 1931/11/18 Today's Date: 07/29/2015    History of Present Illness 81 yo female presented with cough, dyspnea, weakness, incontinence from PNA, sepsis. Radiation for meningioma. MD reports necrotizing pneumonia. Pt intubated from 10/3 to 10/6. Now DNR/DNI.     PT Comments    Pt making slow progress toward goals. Was agreeable to out of bed initially, however pt found to have been incontinent of bowels in bed. After rolling in bed for pericare, pt requested to stay in bed due to fatigue. Will continue acute PT toward goals per plan of care.   Follow Up Recommendations  CIR (pending progress and tolerance to therapy)     Equipment Recommendations  Other (comment) (TBD)    Recommendations for Other Services Rehab consult;OT consult     Precautions / Restrictions Precautions Precautions: Sternal Precaution Comments: monitor O2 Restrictions Weight Bearing Restrictions: No    Mobility  Bed Mobility Overal bed mobility: Needs Assistance   Rolling: Min assist         General bed mobility comments: assist with rails and cues on technique to roll right and left.   Transfers      General transfer comment: declined out of bed after rolling/bed mobility for pericare       Cognition Arousal/Alertness: Awake/alert Behavior During Therapy: Flat affect Overall Cognitive Status: Within Functional Limits for tasks assessed (daughter present)         Pertinent Vitals/Pain Pain Assessment: No/denies pain     PT Goals (current goals can now be found in the care plan section) Acute Rehab PT Goals Patient Stated Goal: None stated PT Goal Formulation: With family Time For Goal Achievement: 08/11/15 Potential to Achieve Goals: Fair Progress towards PT goals: Progressing toward goals    Frequency  Min 3X/week    PT Plan Current plan remains appropriate    End of Session Equipment  Utilized During Treatment: Oxygen Activity Tolerance: Patient limited by fatigue Patient left: in bed;with call bell/phone within reach;with family/visitor present     Time: 5003-7048 PT Time Calculation (min) (ACUTE ONLY): 18 min  Charges:  $Therapeutic Activity: 8-22 mins            Willow Ora 07/29/2015, 12:11 PM  Willow Ora, PTA, CLT Acute Rehab Services [phone] 07/29/2015, 12:13 PM

## 2015-07-29 NOTE — Progress Notes (Signed)
Speech Language Pathology Treatment: Dysphagia  Patient Details Name: Meghan Yu MRN: [id number] DOB: Aug 20, 1932 Today's Date: 07/29/2015 Time: 7616-0737 SLP Time Calculation (min) (ACUTE ONLY): 20 min  Assessment / Plan / Recommendation Clinical Impression  Pt refused solid consistencies d/t having breakfast tray prior to SLP arriving; daughter informed SLP she was able to consume oatmeal, but limited appetite impacts intake overall.  Nectar-thickened liquids consumed via straw with minimal verbal cues from SLP; thin liquids trialed, but delayed cough persists with intake; aphonic vocal quality continues and weak, but improving cough as well.  Recommend continuing current diet of Dysphagia 3/nectar-thickened liquids for safety until pharyngeal strength/coordination improves with intake.   HPI Other Pertinent Information: 81yo female presented with cough, dyspnea, weakness, incontinence from PNA, sepsis. Radiation for meningioma. MD reprots necrotizing pneumonia. Family reports dysphagia during radiation treatment due to severe thrush.  Pt intubated from 10/3 to 10/6. Now DNR/DNI.    Pertinent Vitals Pain Assessment: No/denies pain  SLP Plan  Continue with current plan of care    Recommendations Diet recommendations: Dysphagia 3 (mechanical soft);Nectar-thick liquid Liquids provided via: Cup;Straw Medication Administration: Crushed with puree Compensations: Slow rate;Small sips/bites              Oral Care Recommendations: Oral care BID Plan: Continue with current plan of care         ADAMS,PAT, M.S., CCC-SLP 07/29/2015, 10:42 AM

## 2015-07-29 NOTE — Progress Notes (Signed)
PATIENT DETAILS Name: Meghan Yu Age: 81 y.o. Sex: female Date of Birth: 1932/01/06 Admit Date: 07/24/2015 Admitting Physician [name], MD MHD:[id number], MD  Brief Narrative: 81yo female with hx of Meningioma-s/p craniotomy in 2014-undergoing XRT prior to this admit-presented with cough/fever/weakness-found to have PNA with septic shock. Developed worsening respiratory failure and intubated on 10/3. Tracheal aspirate/Blood cultures positive for Pseudomonas. Managed by PCCM in the ICU-extubated on 10/6 per her wishes. DNR status-transferred to [facility] on 10/8.See below for further details.  Major events: 10/03 Admit, VDRF 10/3- shock, pressors 10/5- awake alert, cooperative 10/6- bronch LLL blood old friable, c/w necrotizing PNA 10/6- extubated per pt wishes-DNR 10/8-transfer to [facility]  Subjective: Appears very weak. Only complaint is odynophagia.  Assessment/Plan: Active Problems: Acute Hypoxic Respiratory Failure:secondary to Necrotizing Pseudomonal PNA. Intubated on 10/3, extubated on 10/6. Doing well on 2L via nasal cannula-continue IV Abx. Follow  HCAP:Necrotizing PNA due to Pseudomonas. Tracheal Aspirate on 10/3 +ve for Pseudomonas. Continue Fortaz-stop date 10/16.  Pseudomonas Bacteremia:Blood culture on 10/3 +ve for Pseudomonas-On Fortaz-repeat Blood culture on 10/5 neg so far.   Septic Shock:secondary to HCAP/Pseudomonal Bacteremia. Sepsis Pathophysiology has resolved.Afebrile-Leukocytosis has resolved.  Abx/cultures as above.  Acute Encephalopathy:due to sepsis. Resolved.  HER:DEYCXKGY azotemia-resolved.  Thrombocytopenia:secondary to sepsis-improving. Follow  Anemia:secondary to critical illness-Hb stable-follow  Hypophosphotemia:resolved.   Hx of Meningioma:s/p craniotomy and resection in 2014-getting XRT prior to this admit. On decardron-slowly taper back to home dose  Hypothyroidism:continue Synthroid  Dysphagia/Odynophagia:Suspect  dysphagia secondary to acute illness/debility-odynophagia ongoing prior to this admit-likely due to XRT/thrush-on Diflucan.On Dysphagia 3 diet-briefly spoke with patient-aware of aspiration risk-no family at bedside  Steroid induced hyperglycemia:A1C 5.7-Continue SSI-follow  HTN: resume Metoprolol-follow BP trend. Resume Amlodipine over the next few days  GERD:PPI  Dyslipidemia:resume Statin when more stable.  Deconditioning:PT eval completed-recommending CIR-will consult  Disposition: Remain inpatient-CIR vs SNF early next week  Antimicrobial agents  See below  Anti-infectives    Start     Dose/Rate Route Frequency Ordered Stop   07/27/15 2200  cefTAZidime (FORTAZ) 2 g in dextrose 5 % 50 mL IVPB     2 g 100 mL/hr over 30 Minutes Intravenous Every 12 hours 07/27/15 1136 08/06/15 2159   07/26/15 1100  meropenem (MERREM) 1 g in sodium chloride 0.9 % 100 mL IVPB  Status:  Discontinued     1 g 200 mL/hr over 30 Minutes Intravenous Every 12 hours 07/26/15 1038 07/27/15 1136   07/25/15 2200  fluconazole (DIFLUCAN) IVPB 100 mg     100 mg 50 mL/hr over 60 Minutes Intravenous Daily at bedtime 07/24/15 2102 08/01/15 2159   07/25/15 1400  vancomycin (VANCOCIN) 500 mg in sodium chloride 0.9 % 100 mL IVPB  Status:  Discontinued     500 mg 100 mL/hr over 60 Minutes Intravenous Every 24 hours 07/24/15 1428 07/25/15 1011   07/25/15 1100  meropenem (MERREM) 500 mg in sodium chloride 0.9 % 50 mL IVPB  Status:  Discontinued     500 mg 100 mL/hr over 30 Minutes Intravenous Every 12 hours 07/25/15 1011 07/26/15 1038   07/24/15 2200  fluconazole (DIFLUCAN) IVPB 200 mg     200 mg 100 mL/hr over 60 Minutes Intravenous  Once 07/24/15 2058 07/25/15 0021   07/24/15 2200  fluconazole (DIFLUCAN) IVPB 100 mg  Status:  Discontinued     100 mg 50 mL/hr over 60 Minutes Intravenous Daily at bedtime 07/24/15 2058 07/24/15 2102  07/24/15 2000  piperacillin-tazobactam (ZOSYN) IVPB 3.375 g  Status:   Discontinued     3.375 g 12.5 mL/hr over 240 Minutes Intravenous Every 8 hours 07/24/15 1428 07/25/15 1011   07/24/15 1400  piperacillin-tazobactam (ZOSYN) IVPB 3.375 g     3.375 g 100 mL/hr over 30 Minutes Intravenous  Once 07/24/15 1347 07/24/15 1437   07/24/15 1400  vancomycin (VANCOCIN) IVPB 1000 mg/200 mL premix     1,000 mg 200 mL/hr over 60 Minutes Intravenous  Once 07/24/15 1347 07/24/15 1527      DVT Prophylaxis: Prophylactic Lovenox from 10/8-as platelet improving  Code Status:  DNR  Family Communication None at bedside  Procedures: 10/03 >>IJ CVL  10/3>>10/6 ETT 10/6>>bronch LLL blood old friable, c/w necrotizing PNA  CONSULTS:  pulmonary/intensive care  Time spent 40 minutes-Greater than 50% of this time was spent in counseling, explanation of diagnosis, planning of further management, and coordination of care  MEDICATIONS: Scheduled Meds: . antiseptic oral rinse  7 mL Mouth Rinse QID  . cefTAZidime (FORTAZ)  IV  2 g Intravenous Q12H  . chlorhexidine gluconate  15 mL Mouth Rinse BID  . dexamethasone  4 mg Intravenous TID  . fluconazole (DIFLUCAN) IV  100 mg Intravenous QHS  . furosemide  20 mg Intravenous BID  . insulin aspart  0-9 Units Subcutaneous 6 times per day  . levothyroxine  75 mcg Oral QAC breakfast  . pantoprazole  40 mg Oral Daily   Continuous Infusions: . sodium chloride 250 mL (07/28/15 1300)   PRN Meds:.acetaminophen, ALPRAZolam, fentaNYL (SUBLIMAZE) injection, RESOURCE THICKENUP CLEAR    PHYSICAL EXAM: Vital signs in last 24 hours: Filed Vitals:   07/28/15 2141 07/29/15 0047 07/29/15 0105 07/29/15 0514  BP: 152/68  158/62 164/63  Pulse: 92  82 80  Temp: 100.5 F (38.1 C) 97.8 F (36.6 C) 97.9 F (36.6 C) 98.1 F (36.7 C)  TempSrc: Oral Axillary Axillary Axillary  Resp: 20  21 20   Height:      Weight:      SpO2: 96%  96% 96%    Weight change:  Filed Weights   07/26/15 0414 07/27/15 0328 07/28/15 0500  Weight: 65.3 kg  (143 lb 15.4 oz) 68.2 kg (150 lb 5.7 oz) 62.8 kg (138 lb 7.2 oz)   Body mass index is 27.95 kg/(m^2).   Gen Exam: Awake and alert with clear speech. Appears chronically weak/deconditioned Neck: Supple,  Chest: B/L Clear anteriorly CVS: S1 S2 Regular Abdomen: soft, BS +, non tender, non distended.  Extremities: + edema, lower extremities warm to touch. Neurologic: Non Focal.   Skin: No Rash.   Wounds: N/A.    Intake/Output from previous day:  Intake/Output Summary (Last 24 hours) at 07/29/15 1235 Last data filed at 07/29/15 1102  Gross per 24 hour  Intake    120 ml  Output   2500 ml  Net  -2380 ml     LAB RESULTS: CBC  Recent Labs Lab 07/24/15 1331 07/25/15 0421 07/26/15 0457 07/27/15 0610 07/28/15 0247 07/29/15 0502  WBC 28.0* 33.8* 10.1 9.9 8.1 8.0  HGB 15.4* 12.0 9.1* 9.3* 10.3* 10.5*  HCT 45.3 35.4* 27.7* 27.4* 31.0* 31.7*  PLT 131* 152 72* 78* 106* 114*  MCV 84.2 86.1 83.9 85.6 85.9 86.1  MCH 28.6 29.2 27.6 29.1 28.5 28.5  MCHC 34.0 33.9 32.9 33.9 33.2 33.1  RDW 13.0 13.7 13.7 13.9 14.1 13.6  LYMPHSABS 0.8  --   --   --   --  0.6*  MONOABS 1.1*  --   --   --   --  0.2  EOSABS 0.0  --   --   --   --  0.0  BASOSABS 0.0  --   --   --   --  0.0    Chemistries   Recent Labs Lab 07/26/15 2045 07/27/15 0610 07/27/15 1014 07/27/15 1929 07/27/15 2200 07/28/15 0247 07/29/15 0502  NA 137 135  --  134*  --  138 135  K 3.2* 3.9  --  4.8  --  4.2 3.9  CL 100* 99*  --  95*  --  96* 96*  CO2 28 31  --  29  --  31 29  GLUCOSE 174* 176*  --  249*  --  164* 192*  BUN 12 12  --  11  --  14 22*  CREATININE 0.53 0.49  --  0.56  --  0.50 0.56  CALCIUM 7.6* 7.5*  --  8.1*  --  8.2* 8.5*  MG 1.8  --  2.7*  --  1.9 1.8 2.0    CBG:  Recent Labs Lab 07/28/15 2005 07/28/15 2356 07/29/15 0414 07/29/15 0735 07/29/15 1152  GLUCAP 270* 266* 146* 216* 337*    GFR Estimated Creatinine Clearance: 44.4 mL/min (by C-G formula based on Cr of 0.56).  Coagulation  profile No results for input(s): INR, PROTIME in the last 168 hours.  Cardiac Enzymes  Recent Labs Lab 07/24/15 1554  TROPONINI 0.03    Invalid input(s): POCBNP No results for input(s): DDIMER in the last 72 hours. No results for input(s): HGBA1C in the last 72 hours. No results for input(s): CHOL, HDL, LDLCALC, TRIG, CHOLHDL, LDLDIRECT in the last 72 hours. No results for input(s): TSH, T4TOTAL, T3FREE, THYROIDAB in the last 72 hours.  Invalid input(s): FREET3 No results for input(s): VITAMINB12, FOLATE, FERRITIN, TIBC, IRON, RETICCTPCT in the last 72 hours. No results for input(s): LIPASE, AMYLASE in the last 72 hours.  Urine Studies No results for input(s): UHGB, CRYS in the last 72 hours.  Invalid input(s): UACOL, UAPR, USPG, UPH, UTP, UGL, UKET, UBIL, UNIT, UROB, ULEU, UEPI, UWBC, URBC, UBAC, CAST, UCOM, BILUA  MICROBIOLOGY: Recent Results (from the past 240 hour(s))  Urine culture     Status: None   Collection Time: 07/21/15  5:22 PM  Result Value Ref Range Status   Specimen Description URINE, CLEAN CATCH  Final   Special Requests NONE  Final   Culture >=100,000 COLONIES/mL ESCHERICHIA COLI  Final   Report Status 07/24/2015 FINAL  Final   Organism ID, Bacteria ESCHERICHIA COLI  Final      Susceptibility   Escherichia coli - MIC*    AMPICILLIN >=32 RESISTANT Resistant     CEFAZOLIN <=4 SENSITIVE Sensitive     CEFTRIAXONE <=1 SENSITIVE Sensitive     CIPROFLOXACIN 1 SENSITIVE Sensitive     GENTAMICIN <=1 SENSITIVE Sensitive     IMIPENEM <=0.25 SENSITIVE Sensitive     NITROFURANTOIN <=16 SENSITIVE Sensitive     TRIMETH/SULFA <=20 SENSITIVE Sensitive     AMPICILLIN/SULBACTAM 16 INTERMEDIATE Intermediate     PIP/TAZO <=4 SENSITIVE Sensitive     * >=100,000 COLONIES/mL ESCHERICHIA COLI  Blood Culture (routine x 2)     Status: None   Collection Time: 07/24/15  1:32 PM  Result Value Ref Range Status   Specimen Description BLOOD LEFT FOREARM  Final   Special Requests  BOTTLES DRAWN AEROBIC AND ANAEROBIC 5CC  Final  Culture  Setup Time   Final    GRAM NEGATIVE RODS AEROBIC BOTTLE ONLY CRITICAL RESULT CALLED TO, READ BACK BY AND VERIFIED WITH: MINDY HOPPER @0451  07/25/15 MKELLY    Culture   Final    PSEUDOMONAS AERUGINOSA SUSCEPTIBILITIES PERFORMED ON PREVIOUS CULTURE WITHIN THE LAST 5 DAYS.    Report Status 07/28/2015 FINAL  Final  Blood Culture (routine x 2)     Status: None   Collection Time: 07/24/15  1:35 PM  Result Value Ref Range Status   Specimen Description BLOOD RIGHT ARM  Final   Special Requests BOTTLES DRAWN AEROBIC AND ANAEROBIC 5CC  Final   Culture  Setup Time   Final    GRAM NEGATIVE RODS AEROBIC BOTTLE ONLY CRITICAL RESULT CALLED TO, READ BACK BY AND VERIFIED WITH: MINDY HOPPER @0451  07/25/15 MKELLY    Culture PSEUDOMONAS AERUGINOSA  Final   Report Status 07/27/2015 FINAL  Final   Organism ID, Bacteria PSEUDOMONAS AERUGINOSA  Final      Susceptibility   Pseudomonas aeruginosa - MIC*    CEFTAZIDIME 4 SENSITIVE Sensitive     CIPROFLOXACIN <=0.25 SENSITIVE Sensitive     GENTAMICIN <=1 SENSITIVE Sensitive     IMIPENEM 2 SENSITIVE Sensitive     PIP/TAZO 8 SENSITIVE Sensitive     CEFEPIME 2 SENSITIVE Sensitive     * PSEUDOMONAS AERUGINOSA  Urine culture     Status: None   Collection Time: 07/24/15  5:07 PM  Result Value Ref Range Status   Specimen Description URINE, CATHETERIZED  Final   Special Requests NONE  Final   Culture NO GROWTH 1 DAY  Final   Report Status 07/25/2015 FINAL  Final  Culture, respiratory (NON-Expectorated)     Status: None   Collection Time: 07/24/15  6:10 PM  Result Value Ref Range Status   Specimen Description TRACHEAL ASPIRATE  Final   Special Requests Immunocompromised  Final   Gram Stain   Final    ABUNDANT WBC PRESENT, PREDOMINANTLY PMN RARE SQUAMOUS EPITHELIAL CELLS PRESENT NO ORGANISMS SEEN Performed at [facility]    Culture   Final    ABUNDANT PSEUDOMONAS AERUGINOSA Performed  at [facility]    Report Status 07/27/2015 FINAL  Final   Organism ID, Bacteria PSEUDOMONAS AERUGINOSA  Final      Susceptibility   Pseudomonas aeruginosa - MIC*    CEFEPIME 4 SENSITIVE Sensitive     CEFTAZIDIME 4 SENSITIVE Sensitive     CIPROFLOXACIN <=0.25 SENSITIVE Sensitive     GENTAMICIN <=1 SENSITIVE Sensitive     IMIPENEM 2 SENSITIVE Sensitive     PIP/TAZO 8 SENSITIVE Sensitive     TOBRAMYCIN <=1 SENSITIVE Sensitive     * ABUNDANT PSEUDOMONAS AERUGINOSA  MRSA PCR Screening     Status: None   Collection Time: 07/24/15  7:18 PM  Result Value Ref Range Status   MRSA by PCR NEGATIVE NEGATIVE Final    Comment:        The GeneXpert MRSA Assay (FDA approved for NASAL specimens only), is one component of a comprehensive MRSA colonization surveillance program. It is not intended to diagnose MRSA infection nor to guide or monitor treatment for MRSA infections.   Culture, blood (routine x 2)     Status: None (Preliminary result)   Collection Time: 07/26/15 12:30 PM  Result Value Ref Range Status   Specimen Description BLOOD RIGHT ANTECUBITAL  Final   Special Requests BOTTLES DRAWN AEROBIC AND ANAEROBIC 5CC  Final   Culture NO  GROWTH 3 DAYS  Final   Report Status PENDING  Incomplete  Culture, blood (routine x 2)     Status: None (Preliminary result)   Collection Time: 07/26/15 12:40 PM  Result Value Ref Range Status   Specimen Description BLOOD RIGHT HAND  Final   Special Requests BOTTLES DRAWN AEROBIC ONLY 5CC  Final   Culture NO GROWTH 3 DAYS  Final   Report Status PENDING  Incomplete  C difficile quick scan w PCR reflex     Status: None   Collection Time: 07/26/15 11:59 PM  Result Value Ref Range Status   C Diff antigen NEGATIVE NEGATIVE Final   C Diff toxin NEGATIVE NEGATIVE Final   C Diff interpretation Negative for toxigenic C. difficile  Final    RADIOLOGY STUDIES/RESULTS: Dg Chest 2 View  07/21/2015   CLINICAL DATA:  Weakness, no respiratory symptoms,  possible history of COPD, known brain tumor.  EXAM: CHEST  2 VIEW  COMPARISON:  Chest x-ray of April 13, 2015  FINDINGS: The lungs are adequately inflated and clear. The heart is mildly enlarged but stable. The pulmonary vascularity is normal. The mediastinum is normal in width. There is no pleural effusion. There is a moderate-sized hiatal hernia. The observed bony thorax exhibits no acute abnormality.  IMPRESSION: There is no active cardiopulmonary disease.   Electronically Signed   By: David  Martinique M.D.   On: 07/21/2015 17:15   Ct Head Wo Contrast  07/14/2015   ADDENDUM REPORT: 07/14/2015 20:40 ADDENDUM: Further information from the referring clinician that the patient cannot fully open her mouth. Further evaluation of the mandible shows some anterior subluxation of the mandibular and condyles with respect to the TMJ joint although no true dislocation is noted. These findings were discussed with Dr. Alvino Chapel. Electronically Signed   By: Inez Catalina M.D.   On: 07/14/2015 20:40  07/14/2015   CLINICAL DATA:  Fall today secondary to gait difficulties, known brain tumor  EXAM: CT HEAD WITHOUT CONTRAST  CT MAXILLOFACIAL WITHOUT CONTRAST  TECHNIQUE: Multidetector CT imaging of the head and maxillofacial structures were performed using the standard protocol without intravenous contrast. Multiplanar CT image reconstructions of the maxillofacial structures were also generated.  COMPARISON:  04/16/2015  FINDINGS: CT HEAD FINDINGS  The bony calvarium shows postsurgical changes on the left similar to that seen on the prior exam. Calcified meningioma is again noted in the right temporal region stable in appearance. Postsurgical changes are noted in the left frontal lobe. The patient's known falx meningioma E centric to the right is again seen and stable. The enhancing abnormality in the left frontal region is not as well appreciated on this exam as on the recent MRI. No findings to suggest acute hemorrhage or acute  infarction are noted.  CT MAXILLOFACIAL FINDINGS  Bony structures are within normal limits. No acute fracture is seen. The paranasal sinuses and mastoid air cells are well aerated. The visualized portions of the cervical spine demonstrate mild degenerative change. Small mucosal retention cyst is noted within the left maxillary antrum. Soft tissue structures show no acute abnormality. The orbits and their contents are unremarkable.  IMPRESSION: CT of the head: Postoperative changes.  Stable meningiomas as described.  CT of maxillofacial bones: No acute fracture noted. No significant soft tissue abnormality is noted.  Electronically Signed: By: Inez Catalina M.D. On: 07/14/2015 20:27   US Renal  07/25/2015   CLINICAL DATA:  Acute kidney injury  EXAM: RENAL / URINARY TRACT ULTRASOUND COMPLETE  COMPARISON:  None.  FINDINGS: Right Kidney:  Length: 10.6 cm. Echogenicity within normal limits. No mass or hydronephrosis visualized.  Left Kidney:  Length: 10.7 cm. Echogenicity within normal limits. No mass or hydronephrosis visualized.  Bladder:  Appears normal for degree of bladder distention.  IMPRESSION: 1. Normal exam.   Electronically Signed   By: Kerby Moors M.D.   On: 07/25/2015 14:10   Dg Chest Port 1 View  07/27/2015   CLINICAL DATA:  Volume excess  EXAM: PORTABLE CHEST 1 VIEW  COMPARISON:  07/26/2015  FINDINGS: Endotracheal tube, left IJ central line, and nasogastric tube are in place as before.  Heart contours are obscured. Bibasilar opacities appear stable. Bilateral pleural effusions and bilateral lower lobe opacities are present, left greater than right.  IMPRESSION: Bilateral pleural effusions and bibasilar opacity, left greater than right.   Electronically Signed   By: Nolon Nations M.D.   On: 07/27/2015 13:36   Dg Chest Port 1 View  07/26/2015   CLINICAL DATA:  Pneumonia.  Shortness of breath.  EXAM: PORTABLE CHEST 1 VIEW  COMPARISON:  07/25/2015.  FINDINGS: Endotracheal tube, NG tube, left IJ  line stable position. Heart size normal. Persistent left lower lobe infiltrate left pleural effusion. No pneumothorax.  IMPRESSION: 1. Lines and tubes in stable position. 2. Persistent left lower lobe infiltrate and left pleural effusion.   Electronically Signed   By: Marcello Moores  Register   On: 07/26/2015 07:11   Dg Chest Port 1 View  07/25/2015   CLINICAL DATA:  Hypoxia/respiratory failure  EXAM: PORTABLE CHEST 1 VIEW  COMPARISON:  July 24, 2015  FINDINGS: Endotracheal tube tip is 1.4 cm above the carina. Central catheter tip is at the junction of the left innominate vein and superior vena cava. Nasogastric tube tip and side port are below the diaphragm. No pneumothorax. There is airspace consolidation in the left lower lobe with small left effusion. Lungs elsewhere clear. Heart size and pulmonary vascularity are normal. No adenopathy.  IMPRESSION: Tube and catheter positions as described without pneumothorax. Left lower lobe consolidation with left effusion. Lungs elsewhere clear.   Electronically Signed   By: Lowella Grip III M.D.   On: 07/25/2015 07:16   Dg Chest Portable 1 View  07/24/2015   CLINICAL DATA:  Brain tumor receiving treatment, weakness at home, near syncopal episode  EXAM: PORTABLE CHEST 1 VIEW  COMPARISON:  Portable exam 1721 hours compared to 1317 hours  FINDINGS: Tip of endotracheal tube projects 3.1 cm above carina.  Nasogastric tube extends into stomach.  LEFT jugular central venous catheter tip projects over SVC.  Slight rotation to the RIGHT.  Normal heart size mediastinal contours.  Atelectasis versus consolidation in LEFT lower lobe with associated LEFT basilar effusion.  Remaining lungs clear.  No pneumothorax.  IMPRESSION: Line and tube positions as above.  LEFT pleural effusion with atelectasis versus consolidation in LEFT lower lobe.   Electronically Signed   By: Lavonia Dana M.D.   On: 07/24/2015 17:34   Dg Chest Port 1 View  07/24/2015   CLINICAL DATA:  Shortness of  breath, weakness, near syncopal episode  EXAM: PORTABLE CHEST 1 VIEW  COMPARISON:  07/21/2015  FINDINGS: New hazy opacity over the left lower chest obscuring the left hemidiaphragm compatible with left basilar airspace process and associated left pleural effusion layering posteriorly. Basilar pneumonia not excluded. Right lung remains clear. Mild cardiac enlargement with normal vascularity. No superimposed edema. Negative for pneumothorax. Monitor leads overlie the chest. Bones are osteopenic.  IMPRESSION:  New left basilar opacity and likely posterior layering effusion. Appearance compatible with pneumonia.  Recommend radiographic follow-up to document resolution.   Electronically Signed   By: Jerilynn Mages.  Shick M.D.   On: 07/24/2015 13:34   Ct Maxillofacial Wo Cm  07/14/2015   ADDENDUM REPORT: 07/14/2015 20:40 ADDENDUM: Further information from the referring clinician that the patient cannot fully open her mouth. Further evaluation of the mandible shows some anterior subluxation of the mandibular and condyles with respect to the TMJ joint although no true dislocation is noted. These findings were discussed with Dr. Alvino Chapel. Electronically Signed   By: Inez Catalina M.D.   On: 07/14/2015 20:40  07/14/2015   CLINICAL DATA:  Fall today secondary to gait difficulties, known brain tumor  EXAM: CT HEAD WITHOUT CONTRAST  CT MAXILLOFACIAL WITHOUT CONTRAST  TECHNIQUE: Multidetector CT imaging of the head and maxillofacial structures were performed using the standard protocol without intravenous contrast. Multiplanar CT image reconstructions of the maxillofacial structures were also generated.  COMPARISON:  04/16/2015  FINDINGS: CT HEAD FINDINGS  The bony calvarium shows postsurgical changes on the left similar to that seen on the prior exam. Calcified meningioma is again noted in the right temporal region stable in appearance. Postsurgical changes are noted in the left frontal lobe. The patient's known falx meningioma E centric  to the right is again seen and stable. The enhancing abnormality in the left frontal region is not as well appreciated on this exam as on the recent MRI. No findings to suggest acute hemorrhage or acute infarction are noted.  CT MAXILLOFACIAL FINDINGS  Bony structures are within normal limits. No acute fracture is seen. The paranasal sinuses and mastoid air cells are well aerated. The visualized portions of the cervical spine demonstrate mild degenerative change. Small mucosal retention cyst is noted within the left maxillary antrum. Soft tissue structures show no acute abnormality. The orbits and their contents are unremarkable.  IMPRESSION: CT of the head: Postoperative changes.  Stable meningiomas as described.  CT of maxillofacial bones: No acute fracture noted. No significant soft tissue abnormality is noted.  Electronically Signed: By: Inez Catalina M.D. On: 07/14/2015 20:27   Ct Portable Head W/o Cm  07/25/2015   CLINICAL DATA:  Encephalopathy.  EXAM: CT HEAD WITHOUT CONTRAST  TECHNIQUE: Contiguous axial images were obtained from the base of the skull through the vertex without intravenous contrast.  COMPARISON:  CT scan of July 14, 2015.  FINDINGS: Status post left parietal craniotomy. Left frontal encephalomalacia is again noted. Stable meningioma is noted in the right parafalcine region. No mass effect or midline shift is noted. Ventricular size is within normal limits. There is no evidence of hemorrhage or acute infarction. Stable calcified meningioma is noted in right temporal region.  IMPRESSION: Stable right-sided meningioma as an left-sided encephalomalacia is noted compared to prior exam. No significant changes noted compared to prior exam.   Electronically Signed   By: Marijo Conception, M.D.   On: 07/25/2015 21:21    Oren Binet, MD  [facility] Pager:[phone]  If 7PM-7AM, please contact night-coverage [email or web address] Password [email or web address] 07/29/2015, 12:35 PM   LOS: 5 days

## 2015-07-30 DIAGNOSIS — A419 Sepsis, unspecified organism: Principal | ICD-10-CM

## 2015-07-30 DIAGNOSIS — R6521 Severe sepsis with septic shock: Secondary | ICD-10-CM

## 2015-07-30 LAB — GLUCOSE, CAPILLARY
GLUCOSE-CAPILLARY: 266 mg/dL — AB (ref 65–99)
GLUCOSE-CAPILLARY: 123 mg/dL — AB (ref 65–99)
GLUCOSE-CAPILLARY: 157 mg/dL — AB (ref 65–99)
GLUCOSE-CAPILLARY: 212 mg/dL — AB (ref 65–99)
GLUCOSE-CAPILLARY: 244 mg/dL — AB (ref 65–99)
Glucose-Capillary: 213 mg/dL — ABNORMAL HIGH (ref 65–99)

## 2015-07-30 LAB — COMPREHENSIVE METABOLIC PANEL
ALBUMIN: 2.1 g/dL — AB (ref 3.5–5.0)
ALK PHOS: 75 U/L (ref 38–126)
ALT: 20 U/L (ref 14–54)
AST: 17 U/L (ref 15–41)
Anion gap: 13 (ref 5–15)
BILIRUBIN TOTAL: 0.6 mg/dL (ref 0.3–1.2)
BUN: 22 mg/dL — AB (ref 6–20)
CALCIUM: 8.6 mg/dL — AB (ref 8.9–10.3)
CO2: 34 mmol/L — AB (ref 22–32)
Chloride: 89 mmol/L — ABNORMAL LOW (ref 101–111)
Creatinine, Ser: 0.57 mg/dL (ref 0.44–1.00)
GFR calc Af Amer: >60 mL/min (ref >60)
GFR calc non Af Amer: >60 mL/min (ref >60)
GLUCOSE: 153 mg/dL — AB (ref 65–99)
Potassium: 3.1 mmol/L — ABNORMAL LOW (ref 3.5–5.1)
SODIUM: 136 mmol/L (ref 135–145)
TOTAL PROTEIN: 5.5 g/dL — AB (ref 6.5–8.1)

## 2015-07-30 LAB — CBC
HEMATOCRIT: 33.9 % — AB (ref 36.0–46.0)
Hemoglobin: 11.4 g/dL — ABNORMAL LOW (ref 12.0–15.0)
MCH: 28.6 pg (ref 26.0–34.0)
MCHC: 33.6 g/dL (ref 30.0–36.0)
MCV: 85.2 fL (ref 78.0–100.0)
PLATELETS: 146 10*3/uL — AB (ref 150–400)
RBC: 3.98 MIL/uL (ref 3.87–5.11)
RDW: 13.4 % (ref 11.5–15.5)
WBC: 10 10*3/uL (ref 4.0–10.5)

## 2015-07-30 LAB — PHOSPHORUS: Phosphorus: 3.3 mg/dL (ref 2.5–4.6)

## 2015-07-30 LAB — MAGNESIUM: Magnesium: 1.6 mg/dL — ABNORMAL LOW (ref 1.7–2.4)

## 2015-07-30 MED ORDER — POTASSIUM CHLORIDE CRYS ER 20 MEQ PO TBCR
40.0000 meq | EXTENDED_RELEASE_TABLET | ORAL | Status: DC
  Filled 2015-07-30: qty 2

## 2015-07-30 MED ORDER — METOPROLOL TARTRATE 50 MG PO TABS
50.0000 mg | ORAL_TABLET | Freq: Two times a day (BID) | ORAL | Status: DC
  Administered 2015-07-30 – 2015-08-02 (×6): 50 mg via ORAL
  Filled 2015-07-30 (×7): qty 1

## 2015-07-30 MED ORDER — ZOLPIDEM TARTRATE 5 MG PO TABS
5.0000 mg | ORAL_TABLET | Freq: Once | ORAL | Status: AC
  Administered 2015-07-30: 5 mg via ORAL
  Filled 2015-07-30: qty 1

## 2015-07-30 MED ORDER — POTASSIUM CHLORIDE 20 MEQ PO PACK
40.0000 meq | PACK | Freq: Once | ORAL | Status: AC
  Administered 2015-07-30: 40 meq via ORAL
  Filled 2015-07-30: qty 2

## 2015-07-30 MED ORDER — POTASSIUM CHLORIDE 20 MEQ/15ML (10%) PO SOLN
40.0000 meq | ORAL | Status: DC
  Administered 2015-07-30: 40 meq via ORAL
  Filled 2015-07-30: qty 30

## 2015-07-30 MED ORDER — WHITE PETROLATUM GEL
Status: AC
  Administered 2015-07-30: 0.2
  Filled 2015-07-30: qty 1

## 2015-07-30 MED ORDER — METOPROLOL TARTRATE 50 MG PO TABS
50.0000 mg | ORAL_TABLET | Freq: Two times a day (BID) | ORAL | Status: DC
  Administered 2015-07-30: 50 mg via ORAL
  Filled 2015-07-30: qty 1

## 2015-07-30 MED ORDER — MAGNESIUM SULFATE 2 GM/50ML IV SOLN
2.0000 g | Freq: Once | INTRAVENOUS | Status: AC
  Administered 2015-07-30: 2 g via INTRAVENOUS
  Filled 2015-07-30: qty 50

## 2015-07-30 NOTE — Progress Notes (Signed)
ANTIBIOTIC CONSULT NOTE - INITIAL  Pharmacy Consult for ceftazidime Indication: pseudomonal bacteremia/PNA  No Known Allergies  Patient Measurements: Height: 4\' 11"  (149.9 cm) Weight: 144 lb 6.4 oz (65.5 kg) IBW/kg (Calculated) : 43.2 Adjusted Body Weight: 63  Vital Signs: Temp: 97.9 F (36.6 C) (10/09 1412) Temp Source: Oral (10/09 1412) BP: 141/73 mmHg (10/09 1412) Pulse Rate: 87 (10/09 1412) Intake/Output from previous day: 10/08 0701 - 10/09 0700 In: 120 [P.O.:120] Out: 2500 [Urine:2500] Intake/Output from this shift: Total I/O In: -  Out: 1800 [Urine:1800]  Labs:  Recent Labs  07/28/15 0247 07/29/15 0502 07/30/15 0651  WBC 8.1 8.0 10.0  HGB 10.3* 10.5* 11.4*  PLT 106* 114* 146*  CREATININE 0.50 0.56 0.57   Estimated Creatinine Clearance: 45.4 mL/min (by C-G formula based on Cr of 0.57). No results for input(s): VANCOTROUGH, VANCOPEAK, VANCORANDOM, GENTTROUGH, GENTPEAK, GENTRANDOM, TOBRATROUGH, TOBRAPEAK, TOBRARND, AMIKACINPEAK, AMIKACINTROU, AMIKACIN in the last 72 hours.   Microbiology: Recent Results (from the past 720 hour(s))  Urine culture     Status: None   Collection Time: 07/21/15  5:22 PM  Result Value Ref Range Status   Specimen Description URINE, CLEAN CATCH  Final   Special Requests NONE  Final   Culture >=100,000 COLONIES/mL ESCHERICHIA COLI  Final   Report Status 07/24/2015 FINAL  Final   Organism ID, Bacteria ESCHERICHIA COLI  Final      Susceptibility   Escherichia coli - MIC*    AMPICILLIN >=32 RESISTANT Resistant     CEFAZOLIN <=4 SENSITIVE Sensitive     CEFTRIAXONE <=1 SENSITIVE Sensitive     CIPROFLOXACIN 1 SENSITIVE Sensitive     GENTAMICIN <=1 SENSITIVE Sensitive     IMIPENEM <=0.25 SENSITIVE Sensitive     NITROFURANTOIN <=16 SENSITIVE Sensitive     TRIMETH/SULFA <=20 SENSITIVE Sensitive     AMPICILLIN/SULBACTAM 16 INTERMEDIATE Intermediate     PIP/TAZO <=4 SENSITIVE Sensitive     * >=100,000 COLONIES/mL ESCHERICHIA COLI   Blood Culture (routine x 2)     Status: None   Collection Time: 07/24/15  1:32 PM  Result Value Ref Range Status   Specimen Description BLOOD LEFT FOREARM  Final   Special Requests BOTTLES DRAWN AEROBIC AND ANAEROBIC 5CC  Final   Culture  Setup Time   Final    GRAM NEGATIVE RODS AEROBIC BOTTLE ONLY CRITICAL RESULT CALLED TO, READ BACK BY AND VERIFIED WITH: MINDY HOPPER @0451  07/25/15 MKELLY    Culture   Final    PSEUDOMONAS AERUGINOSA SUSCEPTIBILITIES PERFORMED ON PREVIOUS CULTURE WITHIN THE LAST 5 DAYS.    Report Status 07/28/2015 FINAL  Final  Blood Culture (routine x 2)     Status: None   Collection Time: 07/24/15  1:35 PM  Result Value Ref Range Status   Specimen Description BLOOD RIGHT ARM  Final   Special Requests BOTTLES DRAWN AEROBIC AND ANAEROBIC 5CC  Final   Culture  Setup Time   Final    GRAM NEGATIVE RODS AEROBIC BOTTLE ONLY CRITICAL RESULT CALLED TO, READ BACK BY AND VERIFIED WITH: MINDY HOPPER @0451  07/25/15 MKELLY    Culture PSEUDOMONAS AERUGINOSA  Final   Report Status 07/27/2015 FINAL  Final   Organism ID, Bacteria PSEUDOMONAS AERUGINOSA  Final      Susceptibility   Pseudomonas aeruginosa - MIC*    CEFTAZIDIME 4 SENSITIVE Sensitive     CIPROFLOXACIN <=0.25 SENSITIVE Sensitive     GENTAMICIN <=1 SENSITIVE Sensitive     IMIPENEM 2 SENSITIVE Sensitive     PIP/TAZO  8 SENSITIVE Sensitive     CEFEPIME 2 SENSITIVE Sensitive     * PSEUDOMONAS AERUGINOSA  Urine culture     Status: None   Collection Time: 07/24/15  5:07 PM  Result Value Ref Range Status   Specimen Description URINE, CATHETERIZED  Final   Special Requests NONE  Final   Culture NO GROWTH 1 DAY  Final   Report Status 07/25/2015 FINAL  Final  Culture, respiratory (NON-Expectorated)     Status: None   Collection Time: 07/24/15  6:10 PM  Result Value Ref Range Status   Specimen Description TRACHEAL ASPIRATE  Final   Special Requests Immunocompromised  Final   Gram Stain   Final    ABUNDANT WBC  PRESENT, PREDOMINANTLY PMN RARE SQUAMOUS EPITHELIAL CELLS PRESENT NO ORGANISMS SEEN Performed at [facility]    Culture   Final    ABUNDANT PSEUDOMONAS AERUGINOSA Performed at [facility]    Report Status 07/27/2015 FINAL  Final   Organism ID, Bacteria PSEUDOMONAS AERUGINOSA  Final      Susceptibility   Pseudomonas aeruginosa - MIC*    CEFEPIME 4 SENSITIVE Sensitive     CEFTAZIDIME 4 SENSITIVE Sensitive     CIPROFLOXACIN <=0.25 SENSITIVE Sensitive     GENTAMICIN <=1 SENSITIVE Sensitive     IMIPENEM 2 SENSITIVE Sensitive     PIP/TAZO 8 SENSITIVE Sensitive     TOBRAMYCIN <=1 SENSITIVE Sensitive     * ABUNDANT PSEUDOMONAS AERUGINOSA  MRSA PCR Screening     Status: None   Collection Time: 07/24/15  7:18 PM  Result Value Ref Range Status   MRSA by PCR NEGATIVE NEGATIVE Final    Comment:        The GeneXpert MRSA Assay (FDA approved for NASAL specimens only), is one component of a comprehensive MRSA colonization surveillance program. It is not intended to diagnose MRSA infection nor to guide or monitor treatment for MRSA infections.   Culture, blood (routine x 2)     Status: None (Preliminary result)   Collection Time: 07/26/15 12:30 PM  Result Value Ref Range Status   Specimen Description BLOOD RIGHT ANTECUBITAL  Final   Special Requests BOTTLES DRAWN AEROBIC AND ANAEROBIC 5CC  Final   Culture NO GROWTH 4 DAYS  Final   Report Status PENDING  Incomplete  Culture, blood (routine x 2)     Status: None (Preliminary result)   Collection Time: 07/26/15 12:40 PM  Result Value Ref Range Status   Specimen Description BLOOD RIGHT HAND  Final   Special Requests BOTTLES DRAWN AEROBIC ONLY 5CC  Final   Culture NO GROWTH 4 DAYS  Final   Report Status PENDING  Incomplete  C difficile quick scan w PCR reflex     Status: None   Collection Time: 07/26/15 11:59 PM  Result Value Ref Range Status   C Diff antigen NEGATIVE NEGATIVE Final   C Diff toxin NEGATIVE NEGATIVE  Final   C Diff interpretation Negative for toxigenic C. difficile  Final    Medical History: Past Medical History  Diagnosis Date  . Hyperlipidemia   . Anxiety disorder   . Arthritis of knee, right     Needs replacement/Dr. Sharol Given  . Colon polyps     colonoscopy by Dr. Wynetta Emery  . Brain cancer ([place]) 03/30/13    Left frontal meningioma  . Hypothyroidism     hypothyroidism  . UTI (lower urinary tract infection) 2014    HX e. coli  . Degenerative arthritis of  hand     bilateral  . Sepsis ([place])   . SVT (supraventricular tachycardia) ([place]) 2014    Medications:  Scheduled:  . antiseptic oral rinse  7 mL Mouth Rinse QID  . cefTAZidime (FORTAZ)  IV  2 g Intravenous Q12H  . chlorhexidine gluconate  15 mL Mouth Rinse BID  . dexamethasone  4 mg Oral TID  . enoxaparin (LOVENOX) injection  40 mg Subcutaneous Q24H  . fluconazole (DIFLUCAN) IV  100 mg Intravenous QHS  . insulin aspart  0-9 Units Subcutaneous 6 times per day  . levothyroxine  75 mcg Oral QAC breakfast  . metoprolol  50 mg Oral BID  . pantoprazole  40 mg Oral Daily  . potassium chloride  40 mEq Oral Q4H   Assessment: 81 yo presented to the ED with weakness and near syncope. Started vancomycin and zosyn in the ED for sepsis. Recently seen on 9/30 for a UTI and given fosfomycin*1. Repeat cultures are neg to date. Renal fxn is stable. Stop date in place. Rx will sign off     Merrem 10/5>> 10/6  Ceftazidime 10/6>>10/16 Vanc 10/3>>10/4  Zosyn 10/3>>10/4   Blood 10/03 >> 2/2 pseudomonas (pan sensitive)  Blood 10/5>> NGTD Urine 10/03 >> negF  Sputum 10/03 >> pseudomonas  Plan:   Cont Fortaz 2g IV q12 until 10/16 Rx will sign off  Onnie Boer, PharmD Pager: [phone] 07/30/2015 3:12 PM

## 2015-07-30 NOTE — Progress Notes (Addendum)
PATIENT DETAILS Name: Meghan Yu Age: 81 y.o. Sex: female Date of Birth: 1932-01-03 Admit Date: 07/24/2015 Admitting Physician [name], MD MOQ:[id number],YTKPTW, MD  Brief Narrative: 81yo female with hx of Meningioma-s/p craniotomy in 2014-undergoing XRT prior to this admit-presented with cough/fever/weakness-found to have PNA with septic shock. Developed worsening respiratory failure and intubated on 10/3. Tracheal aspirate/Blood cultures positive for Pseudomonas. Managed by PCCM in the ICU-extubated on 10/6 per her wishes. DNR status-transferred to [facility] on 10/8.See below for further details.  Major events: 10/03 Admit, VDRF 10/3- shock, pressors 10/5- awake alert, cooperative 10/6- bronch LLL blood old friable, c/w necrotizing PNA 10/6- extubated per pt wishes-DNR 10/8-transfer to [facility]  Subjective: Appears very weak. Only complaint is odynophagia-which is slowly improving. Daughter at bedside.  Assessment/Plan: Active Problems: Acute Hypoxic Respiratory Failure:secondary to Necrotizing Pseudomonal PNA. Intubated on 10/3, extubated on 10/6. Doing well on 2L via nasal cannula-continue IV Abx. DNR reconfirmed with patient and daughter. Follow  HCAP:Necrotizing PNA due to Pseudomonas. Tracheal Aspirate on 10/3 +ve for Pseudomonas. Continue Fortaz-stop date 10/16. Improving, afebrile.  Pseudomonas Bacteremia:Blood culture on 10/3 +ve for Pseudomonas-On Fortaz-repeat Blood culture on 10/5 neg so far.   Septic Shock:secondary to HCAP/Pseudomonal Bacteremia. Sepsis Pathophysiology has resolved.Afebrile-Leukocytosis has resolved.  Abx/cultures as above.  Acute Encephalopathy:due to sepsis. Resolved.  SFK:CLEXNTZG azotemia-resolved.  Thrombocytopenia:secondary to sepsis-improving. Follow periodically  Anemia:secondary to critical illness-Hb stable-follow periodically  Hypophosphotemia:resolved.   Hypokalemia: Replete and recheck.  Hx of Meningioma:s/p craniotomy  and resection in 2014-getting XRT prior to this admit. Continue Decadron.  Hypothyroidism:continue Synthroid  Dysphagia/Odynophagia:Suspect dysphagia secondary to acute illness/debility-odynophagia ongoing prior to this admit-likely due to XRT/thrush-on Diflucan.On Dysphagia 3 patient/family aware of aspiration risk  Steroid induced hyperglycemia:A1C 5.7-Continue SSI-follow  HTN: Continue Metoprolol-follow BP trend. Resume Amlodipine over the next few days  GERD:PPI  Dyslipidemia:resume Statin when more stable.  Deconditioning:PT eval completed-recommending CIR-will consult  Disposition: Remain inpatient-CIR vs SNF early next week  Antimicrobial agents  See below  Anti-infectives    Start     Dose/Rate Route Frequency Ordered Stop   07/27/15 2200  cefTAZidime (FORTAZ) 2 g in dextrose 5 % 50 mL IVPB     2 g 100 mL/hr over 30 Minutes Intravenous Every 12 hours 07/27/15 1136 08/06/15 2159   07/26/15 1100  meropenem (MERREM) 1 g in sodium chloride 0.9 % 100 mL IVPB  Status:  Discontinued     1 g 200 mL/hr over 30 Minutes Intravenous Every 12 hours 07/26/15 1038 07/27/15 1136   07/25/15 2200  fluconazole (DIFLUCAN) IVPB 100 mg     100 mg 50 mL/hr over 60 Minutes Intravenous Daily at bedtime 07/24/15 2102 08/01/15 2159   07/25/15 1400  vancomycin (VANCOCIN) 500 mg in sodium chloride 0.9 % 100 mL IVPB  Status:  Discontinued     500 mg 100 mL/hr over 60 Minutes Intravenous Every 24 hours 07/24/15 1428 07/25/15 1011   07/25/15 1100  meropenem (MERREM) 500 mg in sodium chloride 0.9 % 50 mL IVPB  Status:  Discontinued     500 mg 100 mL/hr over 30 Minutes Intravenous Every 12 hours 07/25/15 1011 07/26/15 1038   07/24/15 2200  fluconazole (DIFLUCAN) IVPB 200 mg     200 mg 100 mL/hr over 60 Minutes Intravenous  Once 07/24/15 2058 07/25/15 0021   07/24/15 2200  fluconazole (DIFLUCAN) IVPB 100 mg  Status:  Discontinued     100 mg 50 mL/hr  over 60 Minutes Intravenous Daily at bedtime  07/24/15 2058 07/24/15 2102   07/24/15 2000  piperacillin-tazobactam (ZOSYN) IVPB 3.375 g  Status:  Discontinued     3.375 g 12.5 mL/hr over 240 Minutes Intravenous Every 8 hours 07/24/15 1428 07/25/15 1011   07/24/15 1400  piperacillin-tazobactam (ZOSYN) IVPB 3.375 g     3.375 g 100 mL/hr over 30 Minutes Intravenous  Once 07/24/15 1347 07/24/15 1437   07/24/15 1400  vancomycin (VANCOCIN) IVPB 1000 mg/200 mL premix     1,000 mg 200 mL/hr over 60 Minutes Intravenous  Once 07/24/15 1347 07/24/15 1527      DVT Prophylaxis: Prophylactic Lovenox from 10/8-continue-as platelet improving  Code Status:  DNR reconfirmed multiple times with both patient and daughter  Family Communication None at bedside  Procedures: 10/03 >>IJ CVL  10/3>>10/6 ETT 10/6>>bronch LLL blood old friable, c/w necrotizing PNA  CONSULTS:  pulmonary/intensive care  Time spent 30 minutes-Greater than 50% of this time was spent in counseling, explanation of diagnosis, planning of further management, and coordination of care  MEDICATIONS: Scheduled Meds: . antiseptic oral rinse  7 mL Mouth Rinse QID  . cefTAZidime (FORTAZ)  IV  2 g Intravenous Q12H  . chlorhexidine gluconate  15 mL Mouth Rinse BID  . dexamethasone  4 mg Oral TID  . enoxaparin (LOVENOX) injection  40 mg Subcutaneous Q24H  . fluconazole (DIFLUCAN) IV  100 mg Intravenous QHS  . furosemide  20 mg Intravenous BID  . insulin aspart  0-9 Units Subcutaneous 6 times per day  . levothyroxine  75 mcg Oral QAC breakfast  . metoprolol  50 mg Oral BID  . pantoprazole  40 mg Oral Daily   Continuous Infusions: . sodium chloride 250 mL (07/28/15 1300)   PRN Meds:.acetaminophen, ALPRAZolam, fentaNYL (SUBLIMAZE) injection, RESOURCE THICKENUP CLEAR    PHYSICAL EXAM: Vital signs in last 24 hours: Filed Vitals:   07/29/15 2000 07/30/15 0158 07/30/15 0632 07/30/15 1014  BP:  132/82 164/84 147/92  Pulse:  99 98 121  Temp:  97 F (36.1 C) 97.6 F (36.4  C) 98.2 F (36.8 C)  TempSrc:  Oral Oral Oral  Resp:    20  Height:      Weight: 65.5 kg (144 lb 6.4 oz)     SpO2:  94% 95% 91%    Weight change:  Filed Weights   07/27/15 0328 07/28/15 0500 07/29/15 2000  Weight: 68.2 kg (150 lb 5.7 oz) 62.8 kg (138 lb 7.2 oz) 65.5 kg (144 lb 6.4 oz)   Body mass index is 29.15 kg/(m^2).   Gen Exam: Awake and alert with clear speech. Appears chronically weak/deconditioned Neck: Supple,  Chest: B/L Clear anteriorly CVS: S1 S2 Regular Abdomen: soft, BS +, non tender, non distended.  Extremities: No edema, lower extremities warm to touch. Neurologic: Non Focal.   Skin: No Rash.   Wounds: N/A.    Intake/Output from previous day:  Intake/Output Summary (Last 24 hours) at 07/30/15 1122 Last data filed at 07/30/15 0745  Gross per 24 hour  Intake      0 ml  Output   1800 ml  Net  -1800 ml     LAB RESULTS: CBC  Recent Labs Lab 07/24/15 1331  07/26/15 0457 07/27/15 0610 07/28/15 0247 07/29/15 0502 07/30/15 0651  WBC 28.0*  < > 10.1 9.9 8.1 8.0 10.0  HGB 15.4*  < > 9.1* 9.3* 10.3* 10.5* 11.4*  HCT 45.3  < > 27.7* 27.4* 31.0* 31.7* 33.9*  PLT  131*  < > 72* 78* 106* 114* 146*  MCV 84.2  < > 83.9 85.6 85.9 86.1 85.2  MCH 28.6  < > 27.6 29.1 28.5 28.5 28.6  MCHC 34.0  < > 32.9 33.9 33.2 33.1 33.6  RDW 13.0  < > 13.7 13.9 14.1 13.6 13.4  LYMPHSABS 0.8  --   --   --   --  0.6*  --   MONOABS 1.1*  --   --   --   --  0.2  --   EOSABS 0.0  --   --   --   --  0.0  --   BASOSABS 0.0  --   --   --   --  0.0  --   < > = values in this interval not displayed.  Chemistries   Recent Labs Lab 07/27/15 0610 07/27/15 1014 07/27/15 1929 07/27/15 2200 07/28/15 0247 07/29/15 0502 07/30/15 0651  NA 135  --  134*  --  138 135 136  K 3.9  --  4.8  --  4.2 3.9 3.1*  CL 99*  --  95*  --  96* 96* 89*  CO2 31  --  29  --  31 29 34*  GLUCOSE 176*  --  249*  --  164* 192* 153*  BUN 12  --  11  --  14 22* 22*  CREATININE 0.49  --  0.56  --  0.50  0.56 0.57  CALCIUM 7.5*  --  8.1*  --  8.2* 8.5* 8.6*  MG  --  2.7*  --  1.9 1.8 2.0 1.6*    CBG:  Recent Labs Lab 07/29/15 1558 07/29/15 1953 07/29/15 2359 07/30/15 0325 07/30/15 0758  GLUCAP 314* 178* 213* 266* 123*    GFR Estimated Creatinine Clearance: 45.4 mL/min (by C-G formula based on Cr of 0.57).  Coagulation profile No results for input(s): INR, PROTIME in the last 168 hours.  Cardiac Enzymes  Recent Labs Lab 07/24/15 1554  TROPONINI 0.03    Invalid input(s): POCBNP No results for input(s): DDIMER in the last 72 hours. No results for input(s): HGBA1C in the last 72 hours. No results for input(s): CHOL, HDL, LDLCALC, TRIG, CHOLHDL, LDLDIRECT in the last 72 hours. No results for input(s): TSH, T4TOTAL, T3FREE, THYROIDAB in the last 72 hours.  Invalid input(s): FREET3 No results for input(s): VITAMINB12, FOLATE, FERRITIN, TIBC, IRON, RETICCTPCT in the last 72 hours. No results for input(s): LIPASE, AMYLASE in the last 72 hours.  Urine Studies No results for input(s): UHGB, CRYS in the last 72 hours.  Invalid input(s): UACOL, UAPR, USPG, UPH, UTP, UGL, UKET, UBIL, UNIT, UROB, ULEU, UEPI, UWBC, URBC, UBAC, CAST, UCOM, BILUA  MICROBIOLOGY: Recent Results (from the past 240 hour(s))  Urine culture     Status: None   Collection Time: 07/21/15  5:22 PM  Result Value Ref Range Status   Specimen Description URINE, CLEAN CATCH  Final   Special Requests NONE  Final   Culture >=100,000 COLONIES/mL ESCHERICHIA COLI  Final   Report Status 07/24/2015 FINAL  Final   Organism ID, Bacteria ESCHERICHIA COLI  Final      Susceptibility   Escherichia coli - MIC*    AMPICILLIN >=32 RESISTANT Resistant     CEFAZOLIN <=4 SENSITIVE Sensitive     CEFTRIAXONE <=1 SENSITIVE Sensitive     CIPROFLOXACIN 1 SENSITIVE Sensitive     GENTAMICIN <=1 SENSITIVE Sensitive     IMIPENEM <=0.25 SENSITIVE Sensitive  NITROFURANTOIN <=16 SENSITIVE Sensitive     TRIMETH/SULFA <=20  SENSITIVE Sensitive     AMPICILLIN/SULBACTAM 16 INTERMEDIATE Intermediate     PIP/TAZO <=4 SENSITIVE Sensitive     * >=100,000 COLONIES/mL ESCHERICHIA COLI  Blood Culture (routine x 2)     Status: None   Collection Time: 07/24/15  1:32 PM  Result Value Ref Range Status   Specimen Description BLOOD LEFT FOREARM  Final   Special Requests BOTTLES DRAWN AEROBIC AND ANAEROBIC 5CC  Final   Culture  Setup Time   Final    GRAM NEGATIVE RODS AEROBIC BOTTLE ONLY CRITICAL RESULT CALLED TO, READ BACK BY AND VERIFIED WITH: MINDY HOPPER @0451  07/25/15 MKELLY    Culture   Final    PSEUDOMONAS AERUGINOSA SUSCEPTIBILITIES PERFORMED ON PREVIOUS CULTURE WITHIN THE LAST 5 DAYS.    Report Status 07/28/2015 FINAL  Final  Blood Culture (routine x 2)     Status: None   Collection Time: 07/24/15  1:35 PM  Result Value Ref Range Status   Specimen Description BLOOD RIGHT ARM  Final   Special Requests BOTTLES DRAWN AEROBIC AND ANAEROBIC 5CC  Final   Culture  Setup Time   Final    GRAM NEGATIVE RODS AEROBIC BOTTLE ONLY CRITICAL RESULT CALLED TO, READ BACK BY AND VERIFIED WITH: MINDY HOPPER @0451  07/25/15 MKELLY    Culture PSEUDOMONAS AERUGINOSA  Final   Report Status 07/27/2015 FINAL  Final   Organism ID, Bacteria PSEUDOMONAS AERUGINOSA  Final      Susceptibility   Pseudomonas aeruginosa - MIC*    CEFTAZIDIME 4 SENSITIVE Sensitive     CIPROFLOXACIN <=0.25 SENSITIVE Sensitive     GENTAMICIN <=1 SENSITIVE Sensitive     IMIPENEM 2 SENSITIVE Sensitive     PIP/TAZO 8 SENSITIVE Sensitive     CEFEPIME 2 SENSITIVE Sensitive     * PSEUDOMONAS AERUGINOSA  Urine culture     Status: None   Collection Time: 07/24/15  5:07 PM  Result Value Ref Range Status   Specimen Description URINE, CATHETERIZED  Final   Special Requests NONE  Final   Culture NO GROWTH 1 DAY  Final   Report Status 07/25/2015 FINAL  Final  Culture, respiratory (NON-Expectorated)     Status: None   Collection Time: 07/24/15  6:10 PM  Result  Value Ref Range Status   Specimen Description TRACHEAL ASPIRATE  Final   Special Requests Immunocompromised  Final   Gram Stain   Final    ABUNDANT WBC PRESENT, PREDOMINANTLY PMN RARE SQUAMOUS EPITHELIAL CELLS PRESENT NO ORGANISMS SEEN Performed at [facility]    Culture   Final    ABUNDANT PSEUDOMONAS AERUGINOSA Performed at [facility]    Report Status 07/27/2015 FINAL  Final   Organism ID, Bacteria PSEUDOMONAS AERUGINOSA  Final      Susceptibility   Pseudomonas aeruginosa - MIC*    CEFEPIME 4 SENSITIVE Sensitive     CEFTAZIDIME 4 SENSITIVE Sensitive     CIPROFLOXACIN <=0.25 SENSITIVE Sensitive     GENTAMICIN <=1 SENSITIVE Sensitive     IMIPENEM 2 SENSITIVE Sensitive     PIP/TAZO 8 SENSITIVE Sensitive     TOBRAMYCIN <=1 SENSITIVE Sensitive     * ABUNDANT PSEUDOMONAS AERUGINOSA  MRSA PCR Screening     Status: None   Collection Time: 07/24/15  7:18 PM  Result Value Ref Range Status   MRSA by PCR NEGATIVE NEGATIVE Final    Comment:        The GeneXpert MRSA  Assay (FDA approved for NASAL specimens only), is one component of a comprehensive MRSA colonization surveillance program. It is not intended to diagnose MRSA infection nor to guide or monitor treatment for MRSA infections.   Culture, blood (routine x 2)     Status: None (Preliminary result)   Collection Time: 07/26/15 12:30 PM  Result Value Ref Range Status   Specimen Description BLOOD RIGHT ANTECUBITAL  Final   Special Requests BOTTLES DRAWN AEROBIC AND ANAEROBIC 5CC  Final   Culture NO GROWTH 4 DAYS  Final   Report Status PENDING  Incomplete  Culture, blood (routine x 2)     Status: None (Preliminary result)   Collection Time: 07/26/15 12:40 PM  Result Value Ref Range Status   Specimen Description BLOOD RIGHT HAND  Final   Special Requests BOTTLES DRAWN AEROBIC ONLY 5CC  Final   Culture NO GROWTH 4 DAYS  Final   Report Status PENDING  Incomplete  C difficile quick scan w PCR reflex      Status: None   Collection Time: 07/26/15 11:59 PM  Result Value Ref Range Status   C Diff antigen NEGATIVE NEGATIVE Final   C Diff toxin NEGATIVE NEGATIVE Final   C Diff interpretation Negative for toxigenic C. difficile  Final    RADIOLOGY STUDIES/RESULTS: Dg Chest 2 View  07/21/2015   CLINICAL DATA:  Weakness, no respiratory symptoms, possible history of COPD, known brain tumor.  EXAM: CHEST  2 VIEW  COMPARISON:  Chest x-ray of April 13, 2015  FINDINGS: The lungs are adequately inflated and clear. The heart is mildly enlarged but stable. The pulmonary vascularity is normal. The mediastinum is normal in width. There is no pleural effusion. There is a moderate-sized hiatal hernia. The observed bony thorax exhibits no acute abnormality.  IMPRESSION: There is no active cardiopulmonary disease.   Electronically Signed   By: David  Martinique M.D.   On: 07/21/2015 17:15   Ct Head Wo Contrast  07/14/2015   ADDENDUM REPORT: 07/14/2015 20:40 ADDENDUM: Further information from the referring clinician that the patient cannot fully open her mouth. Further evaluation of the mandible shows some anterior subluxation of the mandibular and condyles with respect to the TMJ joint although no true dislocation is noted. These findings were discussed with Dr. Alvino Chapel. Electronically Signed   By: Inez Catalina M.D.   On: 07/14/2015 20:40  07/14/2015   CLINICAL DATA:  Fall today secondary to gait difficulties, known brain tumor  EXAM: CT HEAD WITHOUT CONTRAST  CT MAXILLOFACIAL WITHOUT CONTRAST  TECHNIQUE: Multidetector CT imaging of the head and maxillofacial structures were performed using the standard protocol without intravenous contrast. Multiplanar CT image reconstructions of the maxillofacial structures were also generated.  COMPARISON:  04/16/2015  FINDINGS: CT HEAD FINDINGS  The bony calvarium shows postsurgical changes on the left similar to that seen on the prior exam. Calcified meningioma is again noted in the right  temporal region stable in appearance. Postsurgical changes are noted in the left frontal lobe. The patient's known falx meningioma E centric to the right is again seen and stable. The enhancing abnormality in the left frontal region is not as well appreciated on this exam as on the recent MRI. No findings to suggest acute hemorrhage or acute infarction are noted.  CT MAXILLOFACIAL FINDINGS  Bony structures are within normal limits. No acute fracture is seen. The paranasal sinuses and mastoid air cells are well aerated. The visualized portions of the cervical spine demonstrate mild degenerative change. Small mucosal  retention cyst is noted within the left maxillary antrum. Soft tissue structures show no acute abnormality. The orbits and their contents are unremarkable.  IMPRESSION: CT of the head: Postoperative changes.  Stable meningiomas as described.  CT of maxillofacial bones: No acute fracture noted. No significant soft tissue abnormality is noted.  Electronically Signed: By: Inez Catalina M.D. On: 07/14/2015 20:27   US Renal  07/25/2015   CLINICAL DATA:  Acute kidney injury  EXAM: RENAL / URINARY TRACT ULTRASOUND COMPLETE  COMPARISON:  None.  FINDINGS: Right Kidney:  Length: 10.6 cm. Echogenicity within normal limits. No mass or hydronephrosis visualized.  Left Kidney:  Length: 10.7 cm. Echogenicity within normal limits. No mass or hydronephrosis visualized.  Bladder:  Appears normal for degree of bladder distention.  IMPRESSION: 1. Normal exam.   Electronically Signed   By: Kerby Moors M.D.   On: 07/25/2015 14:10   Dg Chest Port 1 View  07/27/2015   CLINICAL DATA:  Volume excess  EXAM: PORTABLE CHEST 1 VIEW  COMPARISON:  07/26/2015  FINDINGS: Endotracheal tube, left IJ central line, and nasogastric tube are in place as before.  Heart contours are obscured. Bibasilar opacities appear stable. Bilateral pleural effusions and bilateral lower lobe opacities are present, left greater than right.  IMPRESSION:  Bilateral pleural effusions and bibasilar opacity, left greater than right.   Electronically Signed   By: Nolon Nations M.D.   On: 07/27/2015 13:36   Dg Chest Port 1 View  07/26/2015   CLINICAL DATA:  Pneumonia.  Shortness of breath.  EXAM: PORTABLE CHEST 1 VIEW  COMPARISON:  07/25/2015.  FINDINGS: Endotracheal tube, NG tube, left IJ line stable position. Heart size normal. Persistent left lower lobe infiltrate left pleural effusion. No pneumothorax.  IMPRESSION: 1. Lines and tubes in stable position. 2. Persistent left lower lobe infiltrate and left pleural effusion.   Electronically Signed   By: Marcello Moores  Register   On: 07/26/2015 07:11   Dg Chest Port 1 View  07/25/2015   CLINICAL DATA:  Hypoxia/respiratory failure  EXAM: PORTABLE CHEST 1 VIEW  COMPARISON:  July 24, 2015  FINDINGS: Endotracheal tube tip is 1.4 cm above the carina. Central catheter tip is at the junction of the left innominate vein and superior vena cava. Nasogastric tube tip and side port are below the diaphragm. No pneumothorax. There is airspace consolidation in the left lower lobe with small left effusion. Lungs elsewhere clear. Heart size and pulmonary vascularity are normal. No adenopathy.  IMPRESSION: Tube and catheter positions as described without pneumothorax. Left lower lobe consolidation with left effusion. Lungs elsewhere clear.   Electronically Signed   By: Lowella Grip III M.D.   On: 07/25/2015 07:16   Dg Chest Portable 1 View  07/24/2015   CLINICAL DATA:  Brain tumor receiving treatment, weakness at home, near syncopal episode  EXAM: PORTABLE CHEST 1 VIEW  COMPARISON:  Portable exam 1721 hours compared to 1317 hours  FINDINGS: Tip of endotracheal tube projects 3.1 cm above carina.  Nasogastric tube extends into stomach.  LEFT jugular central venous catheter tip projects over SVC.  Slight rotation to the RIGHT.  Normal heart size mediastinal contours.  Atelectasis versus consolidation in LEFT lower lobe with  associated LEFT basilar effusion.  Remaining lungs clear.  No pneumothorax.  IMPRESSION: Line and tube positions as above.  LEFT pleural effusion with atelectasis versus consolidation in LEFT lower lobe.   Electronically Signed   By: Lavonia Dana M.D.   On: 07/24/2015 17:34  Dg Chest Port 1 View  07/24/2015   CLINICAL DATA:  Shortness of breath, weakness, near syncopal episode  EXAM: PORTABLE CHEST 1 VIEW  COMPARISON:  07/21/2015  FINDINGS: New hazy opacity over the left lower chest obscuring the left hemidiaphragm compatible with left basilar airspace process and associated left pleural effusion layering posteriorly. Basilar pneumonia not excluded. Right lung remains clear. Mild cardiac enlargement with normal vascularity. No superimposed edema. Negative for pneumothorax. Monitor leads overlie the chest. Bones are osteopenic.  IMPRESSION: New left basilar opacity and likely posterior layering effusion. Appearance compatible with pneumonia.  Recommend radiographic follow-up to document resolution.   Electronically Signed   By: Jerilynn Mages.  Shick M.D.   On: 07/24/2015 13:34   Ct Maxillofacial Wo Cm  07/14/2015   ADDENDUM REPORT: 07/14/2015 20:40 ADDENDUM: Further information from the referring clinician that the patient cannot fully open her mouth. Further evaluation of the mandible shows some anterior subluxation of the mandibular and condyles with respect to the TMJ joint although no true dislocation is noted. These findings were discussed with Dr. Alvino Chapel. Electronically Signed   By: Inez Catalina M.D.   On: 07/14/2015 20:40  07/14/2015   CLINICAL DATA:  Fall today secondary to gait difficulties, known brain tumor  EXAM: CT HEAD WITHOUT CONTRAST  CT MAXILLOFACIAL WITHOUT CONTRAST  TECHNIQUE: Multidetector CT imaging of the head and maxillofacial structures were performed using the standard protocol without intravenous contrast. Multiplanar CT image reconstructions of the maxillofacial structures were also  generated.  COMPARISON:  04/16/2015  FINDINGS: CT HEAD FINDINGS  The bony calvarium shows postsurgical changes on the left similar to that seen on the prior exam. Calcified meningioma is again noted in the right temporal region stable in appearance. Postsurgical changes are noted in the left frontal lobe. The patient's known falx meningioma E centric to the right is again seen and stable. The enhancing abnormality in the left frontal region is not as well appreciated on this exam as on the recent MRI. No findings to suggest acute hemorrhage or acute infarction are noted.  CT MAXILLOFACIAL FINDINGS  Bony structures are within normal limits. No acute fracture is seen. The paranasal sinuses and mastoid air cells are well aerated. The visualized portions of the cervical spine demonstrate mild degenerative change. Small mucosal retention cyst is noted within the left maxillary antrum. Soft tissue structures show no acute abnormality. The orbits and their contents are unremarkable.  IMPRESSION: CT of the head: Postoperative changes.  Stable meningiomas as described.  CT of maxillofacial bones: No acute fracture noted. No significant soft tissue abnormality is noted.  Electronically Signed: By: Inez Catalina M.D. On: 07/14/2015 20:27   Ct Portable Head W/o Cm  07/25/2015   CLINICAL DATA:  Encephalopathy.  EXAM: CT HEAD WITHOUT CONTRAST  TECHNIQUE: Contiguous axial images were obtained from the base of the skull through the vertex without intravenous contrast.  COMPARISON:  CT scan of July 14, 2015.  FINDINGS: Status post left parietal craniotomy. Left frontal encephalomalacia is again noted. Stable meningioma is noted in the right parafalcine region. No mass effect or midline shift is noted. Ventricular size is within normal limits. There is no evidence of hemorrhage or acute infarction. Stable calcified meningioma is noted in right temporal region.  IMPRESSION: Stable right-sided meningioma as an left-sided  encephalomalacia is noted compared to prior exam. No significant changes noted compared to prior exam.   Electronically Signed   By: Marijo Conception, M.D.   On: 07/25/2015 21:21  Oren Binet, MD  [facility] Pager:[phone]  If 7PM-7AM, please contact night-coverage [email or web address] Password [email or web address] 07/30/2015, 11:22 AM   LOS: 6 days

## 2015-07-31 ENCOUNTER — Ambulatory Visit: Payer: Medicare Other | Admitting: Radiation Oncology

## 2015-07-31 DIAGNOSIS — R7881 Bacteremia: Secondary | ICD-10-CM

## 2015-07-31 DIAGNOSIS — R269 Unspecified abnormalities of gait and mobility: Secondary | ICD-10-CM

## 2015-07-31 DIAGNOSIS — D696 Thrombocytopenia, unspecified: Secondary | ICD-10-CM

## 2015-07-31 DIAGNOSIS — R5381 Other malaise: Secondary | ICD-10-CM

## 2015-07-31 LAB — CULTURE, BLOOD (ROUTINE X 2)
Culture: NO GROWTH
Culture: NO GROWTH

## 2015-07-31 LAB — GLUCOSE, CAPILLARY
GLUCOSE-CAPILLARY: 157 mg/dL — AB (ref 65–99)
GLUCOSE-CAPILLARY: 130 mg/dL — AB (ref 65–99)
GLUCOSE-CAPILLARY: 187 mg/dL — AB (ref 65–99)
Glucose-Capillary: 140 mg/dL — ABNORMAL HIGH (ref 65–99)
Glucose-Capillary: 194 mg/dL — ABNORMAL HIGH (ref 65–99)
Glucose-Capillary: 313 mg/dL — ABNORMAL HIGH (ref 65–99)

## 2015-07-31 LAB — BASIC METABOLIC PANEL
Anion gap: 9 (ref 5–15)
BUN: 24 mg/dL — AB (ref 6–20)
CALCIUM: 8.5 mg/dL — AB (ref 8.9–10.3)
CHLORIDE: 96 mmol/L — AB (ref 101–111)
CO2: 29 mmol/L (ref 22–32)
CREATININE: 0.57 mg/dL (ref 0.44–1.00)
GFR calc non Af Amer: >60 mL/min (ref >60)
Glucose, Bld: 176 mg/dL — ABNORMAL HIGH (ref 65–99)
Potassium: 4.2 mmol/L (ref 3.5–5.1)
SODIUM: 134 mmol/L — AB (ref 135–145)

## 2015-07-31 LAB — MAGNESIUM: Magnesium: 2.1 mg/dL (ref 1.7–2.4)

## 2015-07-31 MED ORDER — NEPRO/CARBSTEADY PO LIQD
237.0000 mL | Freq: Three times a day (TID) | ORAL | Status: DC
  Administered 2015-07-31 – 2015-08-01 (×4): 237 mL via ORAL

## 2015-07-31 NOTE — Progress Notes (Signed)
PATIENT DETAILS Name: Meghan Yu Age: 81 y.o. Sex: female Date of Birth: 1932/03/19 Admit Date: 07/24/2015 Admitting Physician [name], MD TDV:VOHYWV,PXTGGY, MD  Brief Narrative: 81yo female with hx of Meningioma-s/p craniotomy in 2014-undergoing XRT prior to this admit-presented with cough/fever/weakness-found to have PNA with septic shock. Developed worsening respiratory failure and intubated on 10/3. Tracheal aspirate/Blood cultures positive for Pseudomonas. Managed by PCCM in the ICU-extubated on 10/6 per her wishes. DNR status-transferred to [facility] on 10/8.See below for further details.  Major events: 10/03 Admit, VDRF 10/3- shock, pressors 10/5- awake alert, cooperative 10/6- bronch LLL blood old friable, c/w necrotizing PNA 10/6- extubated per pt wishes-DNR 10/8-transfer to [facility]  Subjective: Appears very deconditioned and weak. Odynophagia slowly improving.Cough when taking deep breaths.  Assessment/Plan: Active Problems: Acute Hypoxic Respiratory Failure:secondary to Necrotizing Pseudomonal PNA. Intubated on 10/3, extubated on 10/6. Doing well on 2L via nasal cannula-continue IV Abx. DNR reconfirmed with patient and daughter. Follow  HCAP:Necrotizing PNA due to Pseudomonas. Tracheal Aspirate on 10/3 +ve for Pseudomonas. Continue Fortaz-stop date 10/16. Improving, afebrile.  Pseudomonas Bacteremia:Blood culture on 10/3 +ve for Pseudomonas-On Fortaz-repeat Blood culture on 10/5 neg so far.   Septic Shock:secondary to HCAP/Pseudomonal Bacteremia. Sepsis Pathophysiology has resolved.Afebrile-Leukocytosis has resolved.  Abx/cultures as above. Main issue now is deconditioning and generalized weakness.  Acute Encephalopathy:due to sepsis. Resolved.  IRS:WNIOEVOJ azotemia-resolved.  Thrombocytopenia:secondary to sepsis-improving. Follow periodically  Anemia:secondary to critical illness-Hb stable-follow periodically  Hypophosphotemia:resolved.    Hypokalemia: Repleted  Hx of Meningioma:s/p craniotomy and resection in 2014-getting XRT prior to this admit. Continue Decadron.  Hypothyroidism:continue Synthroid  Dysphagia/Odynophagia:Suspect dysphagia secondary to acute illness/debility-odynophagia ongoing prior to this admit-likely due to XRT/thrush-on Diflucan.On Dysphagia 3 patient/family aware of aspiration risk  Steroid induced hyperglycemia:A1C 5.7-Continue SSI-follow  HTN: Continue Metoprolol-follow BP trend. Resume Amlodipine over the next few days  GERD:PPI  Dyslipidemia:resume Statin when more stable.  Deconditioning: Medical issues are stable and have plateaued-main issue now is deconditioning and generalized weakness. PT eval completed-recommending CIR- await CIR evaluation  Disposition: Remain inpatient-CIR vs SNF in the next few days  Antimicrobial agents  See below  Anti-infectives    Start     Dose/Rate Route Frequency Ordered Stop   07/27/15 2200  cefTAZidime (FORTAZ) 2 g in dextrose 5 % 50 mL IVPB     2 g 100 mL/hr over 30 Minutes Intravenous Every 12 hours 07/27/15 1136 08/06/15 2159   07/26/15 1100  meropenem (MERREM) 1 g in sodium chloride 0.9 % 100 mL IVPB  Status:  Discontinued     1 g 200 mL/hr over 30 Minutes Intravenous Every 12 hours 07/26/15 1038 07/27/15 1136   07/25/15 2200  fluconazole (DIFLUCAN) IVPB 100 mg     100 mg 50 mL/hr over 60 Minutes Intravenous Daily at bedtime 07/24/15 2102 08/01/15 2159   07/25/15 1400  vancomycin (VANCOCIN) 500 mg in sodium chloride 0.9 % 100 mL IVPB  Status:  Discontinued     500 mg 100 mL/hr over 60 Minutes Intravenous Every 24 hours 07/24/15 1428 07/25/15 1011   07/25/15 1100  meropenem (MERREM) 500 mg in sodium chloride 0.9 % 50 mL IVPB  Status:  Discontinued     500 mg 100 mL/hr over 30 Minutes Intravenous Every 12 hours 07/25/15 1011 07/26/15 1038   07/24/15 2200  fluconazole (DIFLUCAN) IVPB 200 mg     200 mg 100 mL/hr over 60 Minutes Intravenous   Once 07/24/15  2058 07/25/15 0021   07/24/15 2200  fluconazole (DIFLUCAN) IVPB 100 mg  Status:  Discontinued     100 mg 50 mL/hr over 60 Minutes Intravenous Daily at bedtime 07/24/15 2058 07/24/15 2102   07/24/15 2000  piperacillin-tazobactam (ZOSYN) IVPB 3.375 g  Status:  Discontinued     3.375 g 12.5 mL/hr over 240 Minutes Intravenous Every 8 hours 07/24/15 1428 07/25/15 1011   07/24/15 1400  piperacillin-tazobactam (ZOSYN) IVPB 3.375 g     3.375 g 100 mL/hr over 30 Minutes Intravenous  Once 07/24/15 1347 07/24/15 1437   07/24/15 1400  vancomycin (VANCOCIN) IVPB 1000 mg/200 mL premix     1,000 mg 200 mL/hr over 60 Minutes Intravenous  Once 07/24/15 1347 07/24/15 1527      DVT Prophylaxis: Prophylactic Lovenox from 10/8-continue-as platelet improving  Code Status:  DNR  Family Communication Multiple family members at bedside.  Procedures: 10/03 >>IJ CVL  10/3>>10/6 ETT 10/6>>bronch LLL blood old friable, c/w necrotizing PNA  CONSULTS:  pulmonary/intensive care  Time spent 25 minutes-Greater than 50% of this time was spent in counseling, explanation of diagnosis, planning of further management, and coordination of care  MEDICATIONS: Scheduled Meds: . antiseptic oral rinse  7 mL Mouth Rinse QID  . cefTAZidime (FORTAZ)  IV  2 g Intravenous Q12H  . chlorhexidine gluconate  15 mL Mouth Rinse BID  . dexamethasone  4 mg Oral TID  . enoxaparin (LOVENOX) injection  40 mg Subcutaneous Q24H  . fluconazole (DIFLUCAN) IV  100 mg Intravenous QHS  . insulin aspart  0-9 Units Subcutaneous 6 times per day  . levothyroxine  75 mcg Oral QAC breakfast  . metoprolol  50 mg Oral BID  . pantoprazole  40 mg Oral Daily   Continuous Infusions: . sodium chloride 250 mL (07/28/15 1300)   PRN Meds:.acetaminophen, ALPRAZolam, fentaNYL (SUBLIMAZE) injection, RESOURCE THICKENUP CLEAR    PHYSICAL EXAM: Vital signs in last 24 hours: Filed Vitals:   07/31/15 0100 07/31/15 0509 07/31/15 0816  07/31/15 1053  BP: 146/66 153/79 165/81 152/62  Pulse: 72 90 83 82  Temp: 98.2 F (36.8 C) 97.7 F (36.5 C) 97.4 F (36.3 C) 98.2 F (36.8 C)  TempSrc: Oral Axillary Axillary Oral  Resp: 18 18 19 17   Height:      Weight:      SpO2: 94% 96% 94% 95%    Weight change:  Filed Weights   07/27/15 0328 07/28/15 0500 07/29/15 2000  Weight: 68.2 kg (150 lb 5.7 oz) 62.8 kg (138 lb 7.2 oz) 65.5 kg (144 lb 6.4 oz)   Body mass index is 29.15 kg/(m^2).   Gen Exam: Awake and alert with clear speech. Appears chronically weak/deconditioned Neck: Supple,  Chest: B/L Clear anteriorly CVS: S1 S2 Regular Abdomen: soft, BS +, non tender, non distended.  Extremities: No edema, lower extremities warm to touch. Neurologic: Non Focal.   Skin: No Rash.   Wounds: N/A.    Intake/Output from previous day:  Intake/Output Summary (Last 24 hours) at 07/31/15 1113 Last data filed at 07/30/15 1539  Gross per 24 hour  Intake      0 ml  Output   1250 ml  Net  -1250 ml     LAB RESULTS: CBC  Recent Labs Lab 07/24/15 1331  07/26/15 0457 07/27/15 0610 07/28/15 0247 07/29/15 0502 07/30/15 0651  WBC 28.0*  < > 10.1 9.9 8.1 8.0 10.0  HGB 15.4*  < > 9.1* 9.3* 10.3* 10.5* 11.4*  HCT 45.3  < >  27.7* 27.4* 31.0* 31.7* 33.9*  PLT 131*  < > 72* 78* 106* 114* 146*  MCV 84.2  < > 83.9 85.6 85.9 86.1 85.2  MCH 28.6  < > 27.6 29.1 28.5 28.5 28.6  MCHC 34.0  < > 32.9 33.9 33.2 33.1 33.6  RDW 13.0  < > 13.7 13.9 14.1 13.6 13.4  LYMPHSABS 0.8  --   --   --   --  0.6*  --   MONOABS 1.1*  --   --   --   --  0.2  --   EOSABS 0.0  --   --   --   --  0.0  --   BASOSABS 0.0  --   --   --   --  0.0  --   < > = values in this interval not displayed.  Chemistries   Recent Labs Lab 07/27/15 1929 07/27/15 2200 07/28/15 0247 07/29/15 0502 07/30/15 0651 07/31/15 0540  NA 134*  --  138 135 136 134*  K 4.8  --  4.2 3.9 3.1* 4.2  CL 95*  --  96* 96* 89* 96*  CO2 29  --  31 29 34* 29  GLUCOSE 249*  --  164*  192* 153* 176*  BUN 11  --  14 22* 22* 24*  CREATININE 0.56  --  0.50 0.56 0.57 0.57  CALCIUM 8.1*  --  8.2* 8.5* 8.6* 8.5*  MG  --  1.9 1.8 2.0 1.6* 2.1    CBG:  Recent Labs Lab 07/30/15 1610 07/30/15 2017 07/31/15 0018 07/31/15 0313 07/31/15 0802  GLUCAP 212* 244* 140* 157* 130*    GFR Estimated Creatinine Clearance: 45.4 mL/min (by C-G formula based on Cr of 0.57).  Coagulation profile No results for input(s): INR, PROTIME in the last 168 hours.  Cardiac Enzymes  Recent Labs Lab 07/24/15 1554  TROPONINI 0.03    Invalid input(s): POCBNP No results for input(s): DDIMER in the last 72 hours. No results for input(s): HGBA1C in the last 72 hours. No results for input(s): CHOL, HDL, LDLCALC, TRIG, CHOLHDL, LDLDIRECT in the last 72 hours. No results for input(s): TSH, T4TOTAL, T3FREE, THYROIDAB in the last 72 hours.  Invalid input(s): FREET3 No results for input(s): VITAMINB12, FOLATE, FERRITIN, TIBC, IRON, RETICCTPCT in the last 72 hours. No results for input(s): LIPASE, AMYLASE in the last 72 hours.  Urine Studies No results for input(s): UHGB, CRYS in the last 72 hours.  Invalid input(s): UACOL, UAPR, USPG, UPH, UTP, UGL, UKET, UBIL, UNIT, UROB, ULEU, UEPI, UWBC, URBC, UBAC, CAST, UCOM, BILUA  MICROBIOLOGY: Recent Results (from the past 240 hour(s))  Urine culture     Status: None   Collection Time: 07/21/15  5:22 PM  Result Value Ref Range Status   Specimen Description URINE, CLEAN CATCH  Final   Special Requests NONE  Final   Culture >=100,000 COLONIES/mL ESCHERICHIA COLI  Final   Report Status 07/24/2015 FINAL  Final   Organism ID, Bacteria ESCHERICHIA COLI  Final      Susceptibility   Escherichia coli - MIC*    AMPICILLIN >=32 RESISTANT Resistant     CEFAZOLIN <=4 SENSITIVE Sensitive     CEFTRIAXONE <=1 SENSITIVE Sensitive     CIPROFLOXACIN 1 SENSITIVE Sensitive     GENTAMICIN <=1 SENSITIVE Sensitive     IMIPENEM <=0.25 SENSITIVE Sensitive      NITROFURANTOIN <=16 SENSITIVE Sensitive     TRIMETH/SULFA <=20 SENSITIVE Sensitive     AMPICILLIN/SULBACTAM 16 INTERMEDIATE  Intermediate     PIP/TAZO <=4 SENSITIVE Sensitive     * >=100,000 COLONIES/mL ESCHERICHIA COLI  Blood Culture (routine x 2)     Status: None   Collection Time: 07/24/15  1:32 PM  Result Value Ref Range Status   Specimen Description BLOOD LEFT FOREARM  Final   Special Requests BOTTLES DRAWN AEROBIC AND ANAEROBIC 5CC  Final   Culture  Setup Time   Final    GRAM NEGATIVE RODS AEROBIC BOTTLE ONLY CRITICAL RESULT CALLED TO, READ BACK BY AND VERIFIED WITH: MINDY HOPPER @0451  07/25/15 MKELLY    Culture   Final    PSEUDOMONAS AERUGINOSA SUSCEPTIBILITIES PERFORMED ON PREVIOUS CULTURE WITHIN THE LAST 5 DAYS.    Report Status 07/28/2015 FINAL  Final  Blood Culture (routine x 2)     Status: None   Collection Time: 07/24/15  1:35 PM  Result Value Ref Range Status   Specimen Description BLOOD RIGHT ARM  Final   Special Requests BOTTLES DRAWN AEROBIC AND ANAEROBIC 5CC  Final   Culture  Setup Time   Final    GRAM NEGATIVE RODS AEROBIC BOTTLE ONLY CRITICAL RESULT CALLED TO, READ BACK BY AND VERIFIED WITH: MINDY HOPPER @0451  07/25/15 MKELLY    Culture PSEUDOMONAS AERUGINOSA  Final   Report Status 07/27/2015 FINAL  Final   Organism ID, Bacteria PSEUDOMONAS AERUGINOSA  Final      Susceptibility   Pseudomonas aeruginosa - MIC*    CEFTAZIDIME 4 SENSITIVE Sensitive     CIPROFLOXACIN <=0.25 SENSITIVE Sensitive     GENTAMICIN <=1 SENSITIVE Sensitive     IMIPENEM 2 SENSITIVE Sensitive     PIP/TAZO 8 SENSITIVE Sensitive     CEFEPIME 2 SENSITIVE Sensitive     * PSEUDOMONAS AERUGINOSA  Urine culture     Status: None   Collection Time: 07/24/15  5:07 PM  Result Value Ref Range Status   Specimen Description URINE, CATHETERIZED  Final   Special Requests NONE  Final   Culture NO GROWTH 1 DAY  Final   Report Status 07/25/2015 FINAL  Final  Culture, respiratory (NON-Expectorated)      Status: None   Collection Time: 07/24/15  6:10 PM  Result Value Ref Range Status   Specimen Description TRACHEAL ASPIRATE  Final   Special Requests Immunocompromised  Final   Gram Stain   Final    ABUNDANT WBC PRESENT, PREDOMINANTLY PMN RARE SQUAMOUS EPITHELIAL CELLS PRESENT NO ORGANISMS SEEN Performed at [facility]    Culture   Final    ABUNDANT PSEUDOMONAS AERUGINOSA Performed at [facility]    Report Status 07/27/2015 FINAL  Final   Organism ID, Bacteria PSEUDOMONAS AERUGINOSA  Final      Susceptibility   Pseudomonas aeruginosa - MIC*    CEFEPIME 4 SENSITIVE Sensitive     CEFTAZIDIME 4 SENSITIVE Sensitive     CIPROFLOXACIN <=0.25 SENSITIVE Sensitive     GENTAMICIN <=1 SENSITIVE Sensitive     IMIPENEM 2 SENSITIVE Sensitive     PIP/TAZO 8 SENSITIVE Sensitive     TOBRAMYCIN <=1 SENSITIVE Sensitive     * ABUNDANT PSEUDOMONAS AERUGINOSA  MRSA PCR Screening     Status: None   Collection Time: 07/24/15  7:18 PM  Result Value Ref Range Status   MRSA by PCR NEGATIVE NEGATIVE Final    Comment:        The GeneXpert MRSA Assay (FDA approved for NASAL specimens only), is one component of a comprehensive MRSA colonization surveillance program. It is not  intended to diagnose MRSA infection nor to guide or monitor treatment for MRSA infections.   Culture, blood (routine x 2)     Status: None (Preliminary result)   Collection Time: 07/26/15 12:30 PM  Result Value Ref Range Status   Specimen Description BLOOD RIGHT ANTECUBITAL  Final   Special Requests BOTTLES DRAWN AEROBIC AND ANAEROBIC 5CC  Final   Culture NO GROWTH 4 DAYS  Final   Report Status PENDING  Incomplete  Culture, blood (routine x 2)     Status: None (Preliminary result)   Collection Time: 07/26/15 12:40 PM  Result Value Ref Range Status   Specimen Description BLOOD RIGHT HAND  Final   Special Requests BOTTLES DRAWN AEROBIC ONLY 5CC  Final   Culture NO GROWTH 4 DAYS  Final   Report Status  PENDING  Incomplete  C difficile quick scan w PCR reflex     Status: None   Collection Time: 07/26/15 11:59 PM  Result Value Ref Range Status   C Diff antigen NEGATIVE NEGATIVE Final   C Diff toxin NEGATIVE NEGATIVE Final   C Diff interpretation Negative for toxigenic C. difficile  Final    RADIOLOGY STUDIES/RESULTS: Dg Chest 2 View  07/21/2015   CLINICAL DATA:  Weakness, no respiratory symptoms, possible history of COPD, known brain tumor.  EXAM: CHEST  2 VIEW  COMPARISON:  Chest x-ray of April 13, 2015  FINDINGS: The lungs are adequately inflated and clear. The heart is mildly enlarged but stable. The pulmonary vascularity is normal. The mediastinum is normal in width. There is no pleural effusion. There is a moderate-sized hiatal hernia. The observed bony thorax exhibits no acute abnormality.  IMPRESSION: There is no active cardiopulmonary disease.   Electronically Signed   By: David  Martinique M.D.   On: 07/21/2015 17:15   Ct Head Wo Contrast  07/14/2015   ADDENDUM REPORT: 07/14/2015 20:40 ADDENDUM: Further information from the referring clinician that the patient cannot fully open her mouth. Further evaluation of the mandible shows some anterior subluxation of the mandibular and condyles with respect to the TMJ joint although no true dislocation is noted. These findings were discussed with Dr. Alvino Chapel. Electronically Signed   By: Inez Catalina M.D.   On: 07/14/2015 20:40  07/14/2015   CLINICAL DATA:  Fall today secondary to gait difficulties, known brain tumor  EXAM: CT HEAD WITHOUT CONTRAST  CT MAXILLOFACIAL WITHOUT CONTRAST  TECHNIQUE: Multidetector CT imaging of the head and maxillofacial structures were performed using the standard protocol without intravenous contrast. Multiplanar CT image reconstructions of the maxillofacial structures were also generated.  COMPARISON:  04/16/2015  FINDINGS: CT HEAD FINDINGS  The bony calvarium shows postsurgical changes on the left similar to that seen on the  prior exam. Calcified meningioma is again noted in the right temporal region stable in appearance. Postsurgical changes are noted in the left frontal lobe. The patient's known falx meningioma E centric to the right is again seen and stable. The enhancing abnormality in the left frontal region is not as well appreciated on this exam as on the recent MRI. No findings to suggest acute hemorrhage or acute infarction are noted.  CT MAXILLOFACIAL FINDINGS  Bony structures are within normal limits. No acute fracture is seen. The paranasal sinuses and mastoid air cells are well aerated. The visualized portions of the cervical spine demonstrate mild degenerative change. Small mucosal retention cyst is noted within the left maxillary antrum. Soft tissue structures show no acute abnormality. The orbits and their  contents are unremarkable.  IMPRESSION: CT of the head: Postoperative changes.  Stable meningiomas as described.  CT of maxillofacial bones: No acute fracture noted. No significant soft tissue abnormality is noted.  Electronically Signed: By: Inez Catalina M.D. On: 07/14/2015 20:27   US Renal  07/25/2015   CLINICAL DATA:  Acute kidney injury  EXAM: RENAL / URINARY TRACT ULTRASOUND COMPLETE  COMPARISON:  None.  FINDINGS: Right Kidney:  Length: 10.6 cm. Echogenicity within normal limits. No mass or hydronephrosis visualized.  Left Kidney:  Length: 10.7 cm. Echogenicity within normal limits. No mass or hydronephrosis visualized.  Bladder:  Appears normal for degree of bladder distention.  IMPRESSION: 1. Normal exam.   Electronically Signed   By: Kerby Moors M.D.   On: 07/25/2015 14:10   Dg Chest Port 1 View  07/27/2015   CLINICAL DATA:  Volume excess  EXAM: PORTABLE CHEST 1 VIEW  COMPARISON:  07/26/2015  FINDINGS: Endotracheal tube, left IJ central line, and nasogastric tube are in place as before.  Heart contours are obscured. Bibasilar opacities appear stable. Bilateral pleural effusions and bilateral lower lobe  opacities are present, left greater than right.  IMPRESSION: Bilateral pleural effusions and bibasilar opacity, left greater than right.   Electronically Signed   By: Nolon Nations M.D.   On: 07/27/2015 13:36   Dg Chest Port 1 View  07/26/2015   CLINICAL DATA:  Pneumonia.  Shortness of breath.  EXAM: PORTABLE CHEST 1 VIEW  COMPARISON:  07/25/2015.  FINDINGS: Endotracheal tube, NG tube, left IJ line stable position. Heart size normal. Persistent left lower lobe infiltrate left pleural effusion. No pneumothorax.  IMPRESSION: 1. Lines and tubes in stable position. 2. Persistent left lower lobe infiltrate and left pleural effusion.   Electronically Signed   By: Marcello Moores  Register   On: 07/26/2015 07:11   Dg Chest Port 1 View  07/25/2015   CLINICAL DATA:  Hypoxia/respiratory failure  EXAM: PORTABLE CHEST 1 VIEW  COMPARISON:  July 24, 2015  FINDINGS: Endotracheal tube tip is 1.4 cm above the carina. Central catheter tip is at the junction of the left innominate vein and superior vena cava. Nasogastric tube tip and side port are below the diaphragm. No pneumothorax. There is airspace consolidation in the left lower lobe with small left effusion. Lungs elsewhere clear. Heart size and pulmonary vascularity are normal. No adenopathy.  IMPRESSION: Tube and catheter positions as described without pneumothorax. Left lower lobe consolidation with left effusion. Lungs elsewhere clear.   Electronically Signed   By: Lowella Grip III M.D.   On: 07/25/2015 07:16   Dg Chest Portable 1 View  07/24/2015   CLINICAL DATA:  Brain tumor receiving treatment, weakness at home, near syncopal episode  EXAM: PORTABLE CHEST 1 VIEW  COMPARISON:  Portable exam 1721 hours compared to 1317 hours  FINDINGS: Tip of endotracheal tube projects 3.1 cm above carina.  Nasogastric tube extends into stomach.  LEFT jugular central venous catheter tip projects over SVC.  Slight rotation to the RIGHT.  Normal heart size mediastinal contours.   Atelectasis versus consolidation in LEFT lower lobe with associated LEFT basilar effusion.  Remaining lungs clear.  No pneumothorax.  IMPRESSION: Line and tube positions as above.  LEFT pleural effusion with atelectasis versus consolidation in LEFT lower lobe.   Electronically Signed   By: Lavonia Dana M.D.   On: 07/24/2015 17:34   Dg Chest Port 1 View  07/24/2015   CLINICAL DATA:  Shortness of breath, weakness, near syncopal  episode  EXAM: PORTABLE CHEST 1 VIEW  COMPARISON:  07/21/2015  FINDINGS: New hazy opacity over the left lower chest obscuring the left hemidiaphragm compatible with left basilar airspace process and associated left pleural effusion layering posteriorly. Basilar pneumonia not excluded. Right lung remains clear. Mild cardiac enlargement with normal vascularity. No superimposed edema. Negative for pneumothorax. Monitor leads overlie the chest. Bones are osteopenic.  IMPRESSION: New left basilar opacity and likely posterior layering effusion. Appearance compatible with pneumonia.  Recommend radiographic follow-up to document resolution.   Electronically Signed   By: Jerilynn Mages.  Shick M.D.   On: 07/24/2015 13:34   Ct Maxillofacial Wo Cm  07/14/2015   ADDENDUM REPORT: 07/14/2015 20:40 ADDENDUM: Further information from the referring clinician that the patient cannot fully open her mouth. Further evaluation of the mandible shows some anterior subluxation of the mandibular and condyles with respect to the TMJ joint although no true dislocation is noted. These findings were discussed with Dr. Alvino Chapel. Electronically Signed   By: Inez Catalina M.D.   On: 07/14/2015 20:40  07/14/2015   CLINICAL DATA:  Fall today secondary to gait difficulties, known brain tumor  EXAM: CT HEAD WITHOUT CONTRAST  CT MAXILLOFACIAL WITHOUT CONTRAST  TECHNIQUE: Multidetector CT imaging of the head and maxillofacial structures were performed using the standard protocol without intravenous contrast. Multiplanar CT image  reconstructions of the maxillofacial structures were also generated.  COMPARISON:  04/16/2015  FINDINGS: CT HEAD FINDINGS  The bony calvarium shows postsurgical changes on the left similar to that seen on the prior exam. Calcified meningioma is again noted in the right temporal region stable in appearance. Postsurgical changes are noted in the left frontal lobe. The patient's known falx meningioma E centric to the right is again seen and stable. The enhancing abnormality in the left frontal region is not as well appreciated on this exam as on the recent MRI. No findings to suggest acute hemorrhage or acute infarction are noted.  CT MAXILLOFACIAL FINDINGS  Bony structures are within normal limits. No acute fracture is seen. The paranasal sinuses and mastoid air cells are well aerated. The visualized portions of the cervical spine demonstrate mild degenerative change. Small mucosal retention cyst is noted within the left maxillary antrum. Soft tissue structures show no acute abnormality. The orbits and their contents are unremarkable.  IMPRESSION: CT of the head: Postoperative changes.  Stable meningiomas as described.  CT of maxillofacial bones: No acute fracture noted. No significant soft tissue abnormality is noted.  Electronically Signed: By: Inez Catalina M.D. On: 07/14/2015 20:27   Ct Portable Head W/o Cm  07/25/2015   CLINICAL DATA:  Encephalopathy.  EXAM: CT HEAD WITHOUT CONTRAST  TECHNIQUE: Contiguous axial images were obtained from the base of the skull through the vertex without intravenous contrast.  COMPARISON:  CT scan of July 14, 2015.  FINDINGS: Status post left parietal craniotomy. Left frontal encephalomalacia is again noted. Stable meningioma is noted in the right parafalcine region. No mass effect or midline shift is noted. Ventricular size is within normal limits. There is no evidence of hemorrhage or acute infarction. Stable calcified meningioma is noted in right temporal region.   IMPRESSION: Stable right-sided meningioma as an left-sided encephalomalacia is noted compared to prior exam. No significant changes noted compared to prior exam.   Electronically Signed   By: Marijo Conception, M.D.   On: 07/25/2015 21:21    Oren Binet, MD  [facility] Pager:[phone]  If 7PM-7AM, please contact night-coverage [email or web address] Password [email or web address]  07/31/2015, 11:13 AM   LOS: 7 days

## 2015-07-31 NOTE — Progress Notes (Signed)
Speech Language Pathology Treatment: Dysphagia  Patient Details Name: Meghan Yu MRN: [id number] DOB: [birth year] Today's Date: 07/31/2015 Time: 3086-5784 SLP Time Calculation (min) (ACUTE ONLY): 20 min  Assessment / Plan / Recommendation Clinical Impression  Skilled treatment session focused on addressing dysphagia goals.  Patient with aphonic with trace whisper at times.  SLP administered trials of ice chip via teaspoon with delayed weak cough response in 70% of trials.  Cues for multiple swallows and Supraglottic swallow ineffective at consistently preventing overt s/s of aspiration.  SLP provided Max assist multimodal cues for completion of Supraglottic swallow without presence of bolus; however, patient still not able to accurately complete.  Trials of regular textures and nectar-thick liquids via straw and RN administration of meds crushed in puree resulted in no overt s/s of aspiration.  Recommend to continue with current orders due to continued s/s of aspiration with ice chip trials; patient and RN verbalize understanding.     HPI Other Pertinent Information: 81yo female presented with cough, dyspnea, weakness, incontinence from PNA, sepsis. Radiation for meningioma. MD reprots necrotizing pneumonia. Family reports dysphagia during radiation treatment due to severe thrush.  Pt intubated from 10/3 to 10/6. Now DNR/DNI.    Pertinent Vitals Pain Assessment: No/denies pain  SLP Plan  Goals updated;All goals met    Recommendations Diet recommendations: Dysphagia 3 (mechanical soft);Nectar-thick liquid Liquids provided via: Cup;Straw Medication Administration: Crushed with puree Compensations: Slow rate;Small sips/bites Postural Changes and/or Swallow Maneuvers: Seated upright 90 degrees              Oral Care Recommendations: Oral care BID Follow up Recommendations: 24 hour supervision/assistance;Skilled Nursing facility Plan: Goals updated;All goals met    GO    Meghan Yu., CCC-SLP [phone]  [name] 07/31/2015, 11:05 AM

## 2015-07-31 NOTE — Progress Notes (Signed)
Nutrition Follow-up   INTERVENTION:  Provide Nepro Shake po TID, each supplement provides 425 kcal and 19 grams of protein   NUTRITION DIAGNOSIS:   Inadequate oral intake related to inability to eat as evidenced by NPO status.  Ongoing; diet advanced, intake remains inadequate  GOAL:   Patient will meet greater than or equal to 90% of their needs  Unmet  MONITOR:   PO intake, Supplement acceptance, Weight trends, Skin, Labs  REASON FOR ASSESSMENT:   Consult Enteral/tube feeding initiation and management  ASSESSMENT:   81yo female presented with cough, dyspnea, weakness, incontinence from PNA, sepsis. Recently received radiation for meningioma. Extubated on 10/6 per her wishes; DNR status.  Pt was assessed by SLP on 10/7 and advanced to a Dysphagia 3 diet with nectar-thick liquids. Pt's family at bedside assisted in answering questions. Pt has been having difficulty swallowing- pain with solid foods and liquids going down the wrong way. Family reports that patient has been eating and drinking very little the past couple days with no PO intake this morning. Pt agreeable to trying nectar-thick nutritional supplements.  Pt's weight is above usual body weight; +1 RUE and LUE edema per nursing notes.   Labs: low calcium, elevated glucose, low chloride   Diet Order:  DIET DYS 3 Room service appropriate?: Yes; Fluid consistency:: Nectar Thick  Skin:  Wound (see comment) (stage I pressure ulcer on buttocks)  Last BM:  10/10  Height:   Ht Readings from Last 1 Encounters:  07/24/15 4\' 11"  (1.499 m)    Weight:   Wt Readings from Last 1 Encounters:  07/29/15 144 lb 6.4 oz (65.5 kg)    Ideal Body Weight:  44.5 kg  BMI:  Body mass index is 29.15 kg/(m^2).  Estimated Nutritional Needs:   Kcal:  1400-1600  Protein:  75-90 grams  Fluid:  1.401.6 L/day  EDUCATION NEEDS:   No education needs identified at this time  [place], LDN Inpatient Clinical  Dietitian Pager: [phone] After Hours Pager: [phone]

## 2015-07-31 NOTE — Consult Note (Addendum)
Physical Medicine and Rehabilitation Consult Reason for Consult: Debilitation/acute hypoxic respiratory failure/HCAP Referring Physician: [facility]   HPI: Meghan Yu is a 81 y.o. right handed female well-known to rehabilitation services from inpatient rehabilitation admission June 2014 with history of meningioma of falx and left frontal convexity with radiation treatment recently completed and currently remains on Decadron therapy. Patient lives with her family and 24-hour care provided one level home with 5 steps to entry. She used a rolling walker prior to admission needing some assistance with bathing and dressing. Presented 07/24/2015 with increasing shortness of breath, dysphagia and progressive weakness. Chest x-ray showed new left basilar opacity and likely posterior layering effusion consistent with pneumonia. Placed on broad-spectrum antibodies for sepsis with blood cultures on 10/ 3 +ve for Pseudomonas repeat blood cultures on 10/ 5 negative so far. Required intubation in the ER for increasing shortness of breath for protection of airway. Troponin negative. Mildly elevated creatinine 1.30. WBC 28,000 questionable steroid-induced. Echocardiogram with ejection fraction of 60% and no wall motion abnormalities identified. She was extubated 07/27/2015. Subcutaneous Lovenox for DVT prophylaxis. Currently maintained on a mechanical soft nectar thick liquid diet. Physical therapy evaluation completed with recommendations of physical medicine rehabilitation consult.   Review of Systems  Constitutional: Positive for weight loss. Negative for fever and chills.  HENT: Negative for hearing loss.   Eyes: Negative for blurred vision and double vision.  Respiratory: Positive for cough and shortness of breath.   Cardiovascular: Positive for palpitations and leg swelling. Negative for chest pain.  Gastrointestinal: Negative for nausea and vomiting.       GERD Bowel incontinence  Genitourinary:  Negative for hematuria.       Bladder incontinence  Skin: Negative for rash.  Neurological: Positive for weakness. Negative for seizures, loss of consciousness and headaches.  Psychiatric/Behavioral:       Anxiety  All other systems reviewed and are negative.  Past Medical History  Diagnosis Date  . Hyperlipidemia   . Anxiety disorder   . Arthritis of knee, right     Needs replacement/Dr. Sharol Given  . Colon polyps     colonoscopy by Dr. Wynetta Emery  . Brain cancer ([place]) 03/30/13    Left frontal meningioma  . Hypothyroidism     hypothyroidism  . UTI (lower urinary tract infection) 2014    HX e. coli  . Degenerative arthritis of hand     bilateral  . Sepsis ([place])   . SVT (supraventricular tachycardia) ([place]) 2014   Past Surgical History  Procedure Laterality Date  . Knee arthroscopy    . Cholecystectomy    . Abdominal hysterectomy    . Craniotomy Left 03/30/2013    Procedure: LEFT FRONTAL CRANIOTOMY FOR RESECTION OF MENINGIOMA;  Surgeon: Kristeen Miss, MD;  Location: [facility];  Service: Neurosurgery;  Laterality: Left;  Left Frontal Craniotomy for tumor excision   Family History  Problem Relation Age of Onset  . Stroke Mother   . Cirrhosis Father   . Emphysema Sister   . Rectal cancer Sister    Social History:  reports that she has never smoked. She does not have any smokeless tobacco history on file. She reports that she does not drink alcohol or use illicit drugs. Allergies: No Known Allergies Medications Prior to Admission  Medication Sig Dispense Refill  . ALPRAZolam (XANAX) 1 MG tablet Take 1 tablet (1 mg total) by mouth 2 (two) times daily as needed for sleep or anxiety. 30 tablet 0  . amLODipine (  NORVASC) 5 MG tablet Take 5 mg by mouth daily.    . calcium carbonate (OS-CAL) 600 MG TABS Take 600 mg by mouth 2 (two) times daily with a meal.    . cetirizine (ZYRTEC) 10 MG tablet Take 10 mg by mouth daily.    . Cholecalciferol (VITAMIN D) 2000 UNITS CAPS Take 2,000 Units by  mouth daily.     . citalopram (CELEXA) 10 MG tablet Take 10 mg by mouth daily.     . clotrimazole (MYCELEX) 10 MG troche Take 1 lozenge (10 mg total) by mouth 5 (five) times daily. 70 tablet 0  . dexamethasone (DECADRON) 4 MG tablet Take 1 tablet (4 mg total) by mouth 3 (three) times daily. 90 tablet 0  . fluconazole (DIFLUCAN) 100 MG tablet Take 1 tablet (100 mg total) by mouth daily. 7 tablet 0  . ibuprofen (ADVIL,MOTRIN) 200 MG tablet Take 400 mg by mouth every 6 (six) hours as needed for headache, mild pain or moderate pain.    Marland Kitchen levothyroxine (SYNTHROID, LEVOTHROID) 75 MCG tablet Take 1 tablet (75 mcg total) by mouth daily before breakfast. 30 tablet 1  . lidocaine (XYLOCAINE) 2 % solution Patient: Mix 1part 2% viscous lidocaine, 1part H20. Swish and/or swallow 8mL of this mixture, 1min before meals and at bedtime, up to QID 100 mL 5  . metoprolol (LOPRESSOR) 50 MG tablet Take 1 tablet (50 mg total) by mouth every 12 (twelve) hours. (Patient taking differently: Take 50 mg by mouth daily. ) 60 tablet 6  . Multiple Vitamin (MULTIVITAMIN WITH MINERALS) TABS Take 1 tablet by mouth daily.    . Omega-3 Fatty Acids (FISH OIL) 1000 MG CAPS Take 1-2 capsules (1,000-2,000 mg total) by mouth 2 (two) times daily. Takes 2000 mg in the morning and 1000 mg in the evening 60 capsule 0  . omeprazole (PRILOSEC) 40 MG capsule Take 40 mg by mouth daily.     . prochlorperazine (COMPAZINE) 10 MG tablet Take 10 mg by mouth every 6 (six) hours as needed for nausea or vomiting.    . senna-docusate (SENOKOT-S) 8.6-50 MG per tablet Take 1 tablet by mouth at bedtime. 2 tablet 0  . simvastatin (ZOCOR) 20 MG tablet Take 1 tablet (20 mg total) by mouth every evening. 30 tablet 1  . ciprofloxacin (CIPRO) 500 MG tablet Take 1 tablet (500 mg total) by mouth 2 (two) times daily. (Patient not taking: Reported on 05/12/2015) 14 tablet 0  . levETIRAcetam (KEPPRA) 500 MG tablet Take 1 tablet (500 mg total) by mouth 2 (two) times  daily. (Patient not taking: Reported on 07/14/2015) 60 tablet 1  . loratadine (CLARITIN) 10 MG tablet Take 1 tablet (10 mg total) by mouth daily. (Patient not taking: Reported on 05/26/2015) 30 tablet 0  . magnesium chloride (SLOW-MAG) 64 MG TBEC Take 1 tablet (64 mg total) by mouth daily. (Patient not taking: Reported on 05/12/2015) 60 tablet 1    Home: Home Living Family/patient expects to be discharged to:: Unsure Living Arrangements: Children (Lives with daughter Dawnetta) Available Help at Discharge: Family, Available 24 hours/day Type of Home: Mobile home Home Access: Stairs to enter Entrance Stairs-Number of Steps: 5 Entrance Stairs-Rails: Right, Left Home Layout: One level Bathroom Shower/Tub: Gaffer, Other (comment) ("walk-in tub") Home Equipment: [place] in, [employer] - 2 wheels, Bedside commode  Functional History: Prior Function Level of Independence: Needs assistance Gait / Transfers Assistance Needed: household distances with RW ADL's / Homemaking Assistance Needed: Needed slight assist with  dressing and more assist with bath. Communication / Swallowing Assistance Needed: Recent difficulty swallowing, family feels from thrush Functional Status:  Mobility: Bed Mobility Overal bed mobility: Needs Assistance Bed Mobility: Rolling, Sidelying to Sit Rolling: Min assist Sidelying to sit: Mod assist (assist of 1 but min guard of 2) General bed mobility comments: Use of chuck pad to rotate hips to EOB. Mod assist provided for trunk support with supine to sit with Min guard at EOB for safety. Transfers Overall transfer level: Needs assistance Equipment used: 2 person hand held assist Transfers: Sit to/from Stand, Stand Pivot Transfers Sit to Stand: Mod assist Stand pivot transfers: Max assist, +2 physical assistance General transfer comment: Performed sit to stand with Mod assist +2 using chuck pad to initiate stand.  Max assist +2 for standing pivot transfer from  bed to chair with R LE knee/ankle blocking and assistance to move R LE due to inability to dorsiflex R ankle.  Pt with tendency for R ankle to go into PF and inversion.      ADL:    Cognition: Cognition Overall Cognitive Status: Difficult to assess Orientation Level: Oriented X4 Cognition Arousal/Alertness: Awake/alert Behavior During Therapy: Flat affect Overall Cognitive Status: Difficult to assess Difficult to assess due to: Impaired communication (very few words)  Blood pressure 160/78, pulse 105, temperature 97.7 F (36.5 C), temperature source Oral, resp. rate 16, height 4\' 11"  (1.499 m), weight 65.5 kg (144 lb 6.4 oz), SpO2 95 %. Physical Exam  Constitutional: She appears well-developed and well-nourished.  HENT:  Head: Normocephalic.  Eyes: Conjunctivae and EOM are normal.  Neck: Normal range of motion. Neck supple. No thyromegaly present.  Cardiovascular: Normal rate and regular rhythm.   Respiratory: Breath sounds normal.  Decreased inspiratory effort but clear to auscultation +[place]  GI: Bowel sounds are normal. She exhibits no distension.  Musculoskeletal:  Strength 4- throughout, except distal RLE 0/5.  PRAFO in place.  Neurological: She is alert. She displays normal reflexes.  Oriented to person and place date of birth. Follows simple commands Sensation to light touch intact throughout Poor phonation  Skin: Skin is warm and dry.  Psychiatric: She has a normal mood and affect. Her behavior is normal.    Results for orders placed or performed during the hospital encounter of 07/24/15 (from the past 24 hour(s))  Glucose, capillary     Status: Abnormal   Collection Time: 07/31/15 12:18 AM  Result Value Ref Range   Glucose-Capillary 140 (H) 65 - 99 mg/dL   Comment 1 Notify RN    Comment 2 Document in Chart   Glucose, capillary     Status: Abnormal   Collection Time: 07/31/15  3:13 AM  Result Value Ref Range   Glucose-Capillary 157 (H) 65 - 99 mg/dL   Comment 1  Notify RN    Comment 2 Document in Chart   Basic metabolic panel     Status: Abnormal   Collection Time: 07/31/15  5:40 AM  Result Value Ref Range   Sodium 134 (L) 135 - 145 mmol/L   Potassium 4.2 3.5 - 5.1 mmol/L   Chloride 96 (L) 101 - 111 mmol/L   CO2 29 22 - 32 mmol/L   Glucose, Bld 176 (H) 65 - 99 mg/dL   BUN 24 (H) 6 - 20 mg/dL   Creatinine, Ser 0.57 0.44 - 1.00 mg/dL   Calcium 8.5 (L) 8.9 - 10.3 mg/dL   GFR calc non Af Amer >60 >60 mL/min   GFR calc Af  Amer >60 >60 mL/min   Anion gap 9 5 - 15  Magnesium     Status: None   Collection Time: 07/31/15  5:40 AM  Result Value Ref Range   Magnesium 2.1 1.7 - 2.4 mg/dL  Glucose, capillary     Status: Abnormal   Collection Time: 07/31/15  8:02 AM  Result Value Ref Range   Glucose-Capillary 130 (H) 65 - 99 mg/dL  Glucose, capillary     Status: Abnormal   Collection Time: 07/31/15 11:52 AM  Result Value Ref Range   Glucose-Capillary 187 (H) 65 - 99 mg/dL   Comment 1 Notify RN    Comment 2 Document in Chart   Glucose, capillary     Status: Abnormal   Collection Time: 07/31/15  4:58 PM  Result Value Ref Range   Glucose-Capillary 313 (H) 65 - 99 mg/dL   Comment 1 Notify RN    Comment 2 Document in Chart   Glucose, capillary     Status: Abnormal   Collection Time: 07/31/15  7:55 PM  Result Value Ref Range   Glucose-Capillary 194 (H) 65 - 99 mg/dL   Comment 1 Notify RN    Comment 2 Document in Chart    No results found.  Assessment/Plan: Diagnosis: Debility secondary to HCAP Labs and images independently reviewed.  Old records reviewed and summated above. 1. Does the need for close, 24 hr/day medical supervision in concert with the patient's rehab needs make it unreasonable for this patient to be served in a less intensive setting? Yes 2. Co-Morbidities requiring supervision/potential complications: Pain, hyponatremia, anemia 3. Due to bladder management, bowel management, safety, skin/wound care, disease management,  medication administration, pain management and patient education, does the patient require 24 hr/day rehab nursing? Yes 4. Does the patient require coordinated care of a physician, rehab nurse, PT (1-2 hrs/day, 5 days/week), OT (1-2 hrs/day, 5 days/week) and SLP (1-2 hrs/day, 5 days/week) to address physical and functional deficits in the context of the above medical diagnosis(es)? Yes Addressing deficits in the following areas: balance, endurance, locomotion, strength, transferring, bowel/bladder control, bathing, dressing, feeding, grooming, toileting, cognition, speech, language, swallowing and psychosocial support 5. Can the patient actively participate in an intensive therapy program of at least 3 hrs of therapy per day at least 5 days per week? Not at present 6. The potential for patient to make measurable gains while on inpatient rehab is good 7. Anticipated functional outcomes upon discharge from inpatient rehab are supervision and min assist  with PT, modified independent and supervision with OT, modified independent and supervision with SLP. 8. Estimated rehab length of stay to reach the above functional goals is: 10 days. 9. Does the patient have adequate social supports and living environment to accommodate these discharge functional goals? Yes 10. Anticipated D/C setting: Home 11. Anticipated post D/C treatments: HH therapy and Home excercise program 12. Overall Rehab/Functional Prognosis: good  RECOMMENDATIONS: This patient's condition is appropriate for continued rehabilitative care in the following setting: CIR Patient has agreed to participate in recommended program. Potentially Note that insurance prior authorization may be required for reimbursement for recommended care.  Comment: Rehab Admissions Coordinator to follow up, when pt able to tolerate 3 hours of therapy/day.     Delice Lesch, MD 07/31/2015

## 2015-07-31 NOTE — Progress Notes (Signed)
Orthopedic Tech Progress Note Patient Details:  Meghan Yu 1932-10-01 [id number]  Ortho Devices Type of Ortho Device:  (prafo boot) Ortho Device/Splint Location: rle Ortho Device/Splint Interventions: Application   [name] 07/31/2015, 10:08 AM

## 2015-07-31 NOTE — Care Management Important Message (Signed)
Important Message  Patient Details  Name: Meghan Yu MRN: [id number] Date of Birth: [birth year]   Medicare Important Message Given:  Yes-second notification given    Delorse Lek 07/31/2015, 2:07 PM

## 2015-07-31 NOTE — Progress Notes (Signed)
Physical Therapy Treatment Patient Details Name: Meghan Yu MRN: [id number] DOB: [birth year] Today's Date: 07/31/2015    History of Present Illness 81 yo female presented with cough, dyspnea, weakness, incontinence from PNA, sepsis. Radiation for meningioma. MD reports necrotizing pneumonia. Pt intubated from 10/3 to 10/6. Now DNR/DNI.     PT Comments    Pt continues to demonstrate impairments indicated below, requiring further skilled PT.  Pt demonstrated need for Min assist with rolling bilaterally as PT assisted with peri-care.  For supine to sit, pt requires mod assist for trunk and LE management as well as min guard to ensure safety with pt EOB.  She requires Mod A +2 with use of chuck pad for sit to stand transfers and Max assist +2 for standing pivot transfers.  Pt with inability to dorsiflex R LE, indicating the need for the PT to provide blocking to R LE knee/ankle to prevent planterflexion and inversion in standing.  She also required physical assistance to move R LE during transfer as well. Continue to recommend CIR pending pt response and progress in acute PT.   Follow Up Recommendations  CIR (pending progression and tolerance to therapy)     Equipment Recommendations  Other (comment) (TBD)    Recommendations for Other Services Rehab consult     Precautions / Restrictions Precautions Precautions: Sternal Restrictions Weight Bearing Restrictions: No    Mobility  Bed Mobility Overal bed mobility: Needs Assistance Bed Mobility: Rolling;Sidelying to Sit Rolling: Min assist Sidelying to sit: Mod assist (assist of 1 but min guard of 2)       General bed mobility comments: Use of chuck pad to rotate hips to EOB. Mod assist provided for trunk support with supine to sit with Min guard at EOB for safety.  Transfers Overall transfer level: Needs assistance Equipment used: 2 person hand held assist Transfers: Sit to/from [facility] Sit to Stand: Mod  assist Stand pivot transfers: Max assist;+2 physical assistance       General transfer comment: Performed sit to stand with Mod assist +2 using chuck pad to initiate stand.  Max assist +2 for standing pivot transfer from bed to chair with R LE knee/ankle blocking and assistance to move R LE due to inability to dorsiflex R ankle.  Pt with tendency for R ankle to go into PF and inversion.  Ambulation/Gait                 Stairs            Wheelchair Mobility    Modified Rankin (Stroke Patients Only)       Balance Overall balance assessment: Needs assistance Sitting-balance support: Feet supported Sitting balance-Leahy Scale: Fair     Standing balance support: Bilateral upper extremity supported Standing balance-Leahy Scale: Zero Standing balance comment: ability to maintain standing affected by inability to dorsiflex R ankle, making it difficult for pt to keep foot flat on floor                    Cognition Arousal/Alertness: Awake/alert Behavior During Therapy: Flat affect Overall Cognitive Status: Difficult to assess                      Exercises General Exercises - Lower Extremity Ankle Circles/Pumps: AROM;PROM;10 reps;Seated;Other (comment) (AROM on L, PROM on R due to inability to dorsiflex actively) Long Arc Quad: Strengthening;Both;10 reps;Seated Hip Flexion/Marching: AROM;Both;10 reps;Seated    General Comments General comments (skin integrity, edema, etc.): Family  is present and notes that she had no noticable problems with R ankle dorsiflexion PTA.  Pt very quiet and does not communicate much, whispering when she does attempt.  Pt requiring frequent VCs for technique and to stay on task with performance of LE exercises.      Pertinent Vitals/Pain Pain Assessment: No/denies pain    Home Living                      Prior Function            PT Goals (current goals can now be found in the care plan section) Acute Rehab  PT Goals Patient Stated Goal: None stated PT Goal Formulation: With family Time For Goal Achievement: 08/11/15 Potential to Achieve Goals: Fair Progress towards PT goals: Progressing toward goals    Frequency  Min 3X/week    PT Plan Current plan remains appropriate    Co-evaluation             End of Session Equipment Utilized During Treatment: Oxygen;Gait belt Activity Tolerance: Patient tolerated treatment well Patient left: in chair;with family/visitor present;with call bell/phone within reach     Time: 1346-1418 PT Time Calculation (min) (ACUTE ONLY): 32 min  Charges:  $Therapeutic Exercise: 8-22 mins $Therapeutic Activity: 8-22 mins                    G Codes:      Joshuah Minella 08/08/2015, 3:15 PM  Lorita Officer, SPT

## 2015-08-01 LAB — C DIFFICILE QUICK SCREEN W PCR REFLEX
C Diff antigen: NEGATIVE
C Diff interpretation: NEGATIVE
C Diff toxin: NEGATIVE

## 2015-08-01 LAB — GLUCOSE, CAPILLARY
GLUCOSE-CAPILLARY: 139 mg/dL — AB (ref 65–99)
GLUCOSE-CAPILLARY: 183 mg/dL — AB (ref 65–99)
GLUCOSE-CAPILLARY: 234 mg/dL — AB (ref 65–99)
GLUCOSE-CAPILLARY: 301 mg/dL — AB (ref 65–99)
Glucose-Capillary: 179 mg/dL — ABNORMAL HIGH (ref 65–99)
Glucose-Capillary: 303 mg/dL — ABNORMAL HIGH (ref 65–99)

## 2015-08-01 MED ORDER — LEVOFLOXACIN 750 MG PO TABS
750.0000 mg | ORAL_TABLET | ORAL | Status: DC
  Administered 2015-08-01: 750 mg via ORAL
  Filled 2015-08-01: qty 1

## 2015-08-01 MED ORDER — MENTHOL 3 MG MT LOZG
1.0000 | LOZENGE | OROMUCOSAL | Status: DC | PRN
Start: 2015-08-01 — End: 2015-08-02

## 2015-08-01 NOTE — Clinical Social Work Placement (Signed)
   CLINICAL SOCIAL WORK PLACEMENT  NOTE  Date:  08/01/2015  Patient Details  Name: Raynisha Avilla MRN: [id number] Date of Birth: [birth year]  Clinical Social Work is seeking post-discharge placement for this patient at the [place] level of care (*CSW will initial, date and re-position this form in  chart as items are completed):  Yes   Patient/family provided with [place] Work Department's list of facilities offering this level of care within the geographic area requested by the patient (or if unable, by the patient's family).  Yes   Patient/family informed of their freedom to choose among providers that offer the needed level of care, that participate in Medicare, Medicaid or managed care program needed by the patient, have an available bed and are willing to accept the patient.  Yes   Patient/family informed of 's ownership interest in [facility] and [facility], as well as of the fact that they are under no obligation to receive care at these facilities.  PASRR submitted to EDS on 08/01/15     PASRR number received on 08/01/15     Existing PASRR number confirmed on       FL2 transmitted to all facilities in geographic area requested by pt/family on 08/01/15     FL2 transmitted to all facilities within larger geographic area on       Patient informed that his/her managed care company has contracts with or will negotiate with certain facilities, including the following:            Patient/family informed of bed offers received.  Patient chooses bed at       Physician recommends and patient chooses bed at      Patient to be transferred to   on  .  Patient to be transferred to facility by       Patient family notified on   of transfer.  Name of family member notified:        PHYSICIAN       Additional Comment:    _______________________________________________ Greta Doom, LCSW 08/01/2015, 12:12 PM

## 2015-08-01 NOTE — Clinical Social Work Note (Signed)
Clinical Social Work Assessment  Patient Details  Name: Meghan Yu MRN: [id number] Date of Birth: Jun 14, 1932  Date of referral:  08/01/15               Reason for consult:  Facility Placement                Permission sought to share information with:  Family Supports Permission granted to share information::     Name::     Smith,Teshara  Relationship::  daughter   Housing/Transportation Living arrangements for the past 2 months:  Single Family Home Source of Information:  Adult Children Patient Interpreter Needed:  None Criminal Activity/Legal Involvement Pertinent to Current Situation/Hospitalization:  No - Comment as needed Significant Relationships:  Adult Children Lives with:  Adult Children Do you feel safe going back to the place where you live?  No Need for family participation in patient care:  Yes (Comment)  Care giving concerns: Evalette expressed concerns with CSW regarding placement. Shanavia reported that the pt will not be placement long-term. CSW informed Eulia that this placement is only for rehab prior to coming back home.    Social Worker assessment / plan: CSW spoke with the pt's daughter Calvin. CSW introduced self and purpose of the call. CSW discussed SNF rehab. CSW explained the SNF process. CSW explained insurance and its relation to SNF placement. CSW provided [place] with a SNF list. CSW answered all questions in which the [place] inquired about. CSW will continue to follow this pt and assist with discharge as needed.   Employment status:  Retired Forensic scientist:  Medicare PT Recommendations:  Inpatient [place] / Referral to community resources:  [place]  Patient/Family's Response to care: Nikoletta reported being unsure about the plan of care.     Patient/Family's Understanding of and Emotional Response to Diagnosis, Current Treatment, and Prognosis:  Caitlinn did not discuss the pt's care Davonda continue to express concern about  thinking the pt will be placemed at a SNF long-term.  Emotional Assessment Appearance:  Appears stated age Attitude/Demeanor/Rapport:  Unable to Assess Affect (typically observed):  Unable to Assess Orientation:  Oriented to Self, Oriented to Situation Alcohol / Substance use:  Not Applicable Psych involvement (Current and /or in the community):  No (Comment)  Discharge Needs  Concerns to be addressed:  Denies Needs/Concerns at this time Readmission within the last 30 days:  No Current discharge risk:  None Barriers to Discharge:  No Barriers Identified   Kaladin Noseworthy, LCSW 08/01/2015, 11:22 AM

## 2015-08-01 NOTE — Progress Notes (Signed)
I met with pt, grandson and his wife at bedside and then contacted her daughter, Tiauna, by phone. Pt is not at a level to yet be able to tolerate the intensity of an inpt rehab admission. Daughter is in agreement. They are agreeable to SNF short term only. SW is involved. We will sign off. [phone]

## 2015-08-01 NOTE — Progress Notes (Signed)
ANTIBIOTIC CONSULT NOTE - FOLLOW UP  Pharmacy Consult for Levaquin PO Indication: pneumonia and bactermia with Pseudomonas  No Known Allergies  Patient Measurements: Height: 4\' 11"  (149.9 cm) Weight: 130 lb 15.3 oz (59.4 kg) IBW/kg (Calculated) : 43.2  Vital Signs: Temp: 97.7 F (36.5 C) (10/11 0539) Temp Source: Axillary (10/11 0539) BP: 143/60 mmHg (10/11 0947) Pulse Rate: 98 (10/11 0947)  Labs:  Recent Labs  07/30/15 0651 07/31/15 0540  WBC 10.0  --   HGB 11.4*  --   PLT 146*  --   CREATININE 0.57 0.57   Estimated Creatinine Clearance: 43.3 mL/min (by C-G formula based on Cr of 0.57).  Assessment:  Day # 3 Fortaz for Pseudomonal PNA/bactermia and UTI. Last Fortaz dose given at 10am today.  To change to PO Levaquin today to complete 10 days of therapy, stop date  10/16.  Goal of Therapy:  Appropriate Levaquin dose for renal function and infection  Plan:   Levaquin 750 mg PO q48hrs x 3 doses.  First dose today, last dose will be on 08/05/15, but should provide coverage thru stop date of 08/06/15.  Stop [place].   Microbiology: Recent Results (from the past 720 hour(s))  Urine culture     Status: None   Collection Time: 07/21/15  5:22 PM  Result Value Ref Range Status   Specimen Description URINE, CLEAN CATCH  Final   Special Requests NONE  Final   Culture >=100,000 COLONIES/mL ESCHERICHIA COLI  Final   Report Status 07/24/2015 FINAL  Final   Organism ID, Bacteria ESCHERICHIA COLI  Final      Susceptibility   Escherichia coli - MIC*    AMPICILLIN >=32 RESISTANT Resistant     CEFAZOLIN <=4 SENSITIVE Sensitive     CEFTRIAXONE <=1 SENSITIVE Sensitive     CIPROFLOXACIN 1 SENSITIVE Sensitive     GENTAMICIN <=1 SENSITIVE Sensitive     IMIPENEM <=0.25 SENSITIVE Sensitive     NITROFURANTOIN <=16 SENSITIVE Sensitive     TRIMETH/SULFA <=20 SENSITIVE Sensitive     AMPICILLIN/SULBACTAM 16 INTERMEDIATE Intermediate     PIP/TAZO <=4 SENSITIVE Sensitive     *  >=100,000 COLONIES/mL ESCHERICHIA COLI  Blood Culture (routine x 2)     Status: None   Collection Time: 07/24/15  1:32 PM  Result Value Ref Range Status   Specimen Description BLOOD LEFT FOREARM  Final   Special Requests BOTTLES DRAWN AEROBIC AND ANAEROBIC 5CC  Final   Culture  Setup Time   Final    GRAM NEGATIVE RODS AEROBIC BOTTLE ONLY CRITICAL RESULT CALLED TO, READ BACK BY AND VERIFIED WITH: MINDY HOPPER @0451  07/25/15 MKELLY    Culture   Final    PSEUDOMONAS AERUGINOSA SUSCEPTIBILITIES PERFORMED ON PREVIOUS CULTURE WITHIN THE LAST 5 DAYS.    Report Status 07/28/2015 FINAL  Final  Blood Culture (routine x 2)     Status: None   Collection Time: 07/24/15  1:35 PM  Result Value Ref Range Status   Specimen Description BLOOD RIGHT ARM  Final   Special Requests BOTTLES DRAWN AEROBIC AND ANAEROBIC 5CC  Final   Culture  Setup Time   Final    GRAM NEGATIVE RODS AEROBIC BOTTLE ONLY CRITICAL RESULT CALLED TO, READ BACK BY AND VERIFIED WITH: MINDY HOPPER @0451  07/25/15 MKELLY    Culture PSEUDOMONAS AERUGINOSA  Final   Report Status 07/27/2015 FINAL  Final   Organism ID, Bacteria PSEUDOMONAS AERUGINOSA  Final      Susceptibility   Pseudomonas aeruginosa - MIC*  CEFTAZIDIME 4 SENSITIVE Sensitive     CIPROFLOXACIN <=0.25 SENSITIVE Sensitive     GENTAMICIN <=1 SENSITIVE Sensitive     IMIPENEM 2 SENSITIVE Sensitive     PIP/TAZO 8 SENSITIVE Sensitive     CEFEPIME 2 SENSITIVE Sensitive     * PSEUDOMONAS AERUGINOSA  Urine culture     Status: None   Collection Time: 07/24/15  5:07 PM  Result Value Ref Range Status   Specimen Description URINE, CATHETERIZED  Final   Special Requests NONE  Final   Culture NO GROWTH 1 DAY  Final   Report Status 07/25/2015 FINAL  Final  Culture, respiratory (NON-Expectorated)     Status: None   Collection Time: 07/24/15  6:10 PM  Result Value Ref Range Status   Specimen Description TRACHEAL ASPIRATE  Final   Special Requests Immunocompromised  Final    Gram Stain   Final    ABUNDANT WBC PRESENT, PREDOMINANTLY PMN RARE SQUAMOUS EPITHELIAL CELLS PRESENT NO ORGANISMS SEEN Performed at [facility]    Culture   Final    ABUNDANT PSEUDOMONAS AERUGINOSA Performed at [facility]    Report Status 07/27/2015 FINAL  Final   Organism ID, Bacteria PSEUDOMONAS AERUGINOSA  Final      Susceptibility   Pseudomonas aeruginosa - MIC*    CEFEPIME 4 SENSITIVE Sensitive     CEFTAZIDIME 4 SENSITIVE Sensitive     CIPROFLOXACIN <=0.25 SENSITIVE Sensitive     GENTAMICIN <=1 SENSITIVE Sensitive     IMIPENEM 2 SENSITIVE Sensitive     PIP/TAZO 8 SENSITIVE Sensitive     TOBRAMYCIN <=1 SENSITIVE Sensitive     * ABUNDANT PSEUDOMONAS AERUGINOSA  MRSA PCR Screening     Status: None   Collection Time: 07/24/15  7:18 PM  Result Value Ref Range Status   MRSA by PCR NEGATIVE NEGATIVE Final    Comment:        The GeneXpert MRSA Assay (FDA approved for NASAL specimens only), is one component of a comprehensive MRSA colonization surveillance program. It is not intended to diagnose MRSA infection nor to guide or monitor treatment for MRSA infections.   Culture, blood (routine x 2)     Status: None   Collection Time: 07/26/15 12:30 PM  Result Value Ref Range Status   Specimen Description BLOOD RIGHT ANTECUBITAL  Final   Special Requests BOTTLES DRAWN AEROBIC AND ANAEROBIC 5CC  Final   Culture NO GROWTH 5 DAYS  Final   Report Status 07/31/2015 FINAL  Final  Culture, blood (routine x 2)     Status: None   Collection Time: 07/26/15 12:40 PM  Result Value Ref Range Status   Specimen Description BLOOD RIGHT HAND  Final   Special Requests BOTTLES DRAWN AEROBIC ONLY 5CC  Final   Culture NO GROWTH 5 DAYS  Final   Report Status 07/31/2015 FINAL  Final  C difficile quick scan w PCR reflex     Status: None   Collection Time: 07/26/15 11:59 PM  Result Value Ref Range Status   C Diff antigen NEGATIVE NEGATIVE Final   C Diff toxin NEGATIVE NEGATIVE  Final   C Diff interpretation Negative for toxigenic C. difficile  Final     Arty Baumgartner, [place] Pager: [phone] 08/01/2015,11:02 AM

## 2015-08-01 NOTE — Progress Notes (Signed)
PATIENT DETAILS Name: Meghan Yu Age: 81 y.o. Sex: female Date of Birth: 08/20/1932 Admit Date: 07/24/2015 Admitting Physician [name], MD [id number], MD  Brief Narrative: 81yo female with hx of Meningioma-s/p craniotomy in 2014-undergoing XRT prior to this admit-presented with cough/fever/weakness-found to have PNA with septic shock. Developed worsening respiratory failure and intubated on 10/3. Tracheal aspirate/Blood cultures positive for Pseudomonas. Managed by PCCM in the ICU-extubated on 10/6 per her wishes. DNR status-transferred to [facility] on 10/8.See below for further details.  Major events: 10/03 Admit, VDRF 10/3- shock, pressors 10/5- awake alert, cooperative 10/6- bronch LLL blood old friable, c/w necrotizing PNA 10/6- extubated per pt wishes-DNR 10/8-transfer to [facility]  Subjective: Started having diarrhea since yesterday-4-5 times-per RN stools are loose and watery.  Assessment/Plan: Active Problems: Acute Hypoxic Respiratory Failure:secondary to Necrotizing Pseudomonal PNA. Intubated on 10/3, extubated on 10/6. Doing well on 2L via nasal cannula-continue IV Abx. DNR reconfirmed with patient and daughter. Follow  HCAP:Necrotizing PNA due to Pseudomonas. Tracheal Aspirate on 10/3 +ve for Pseudomonas. Maintained on Fortaz-antibiotic stop date 10/16. Spoke with Dr. Caren Griffins Snyder-infectious disease-will review chart-recommended to transition to levofloxacin. Overall slowly Improving, and remains afebrile.  Pseudomonas Bacteremia:Blood culture on 10/3 +ve for Pseudomonas-On Fortaz-repeat Blood culture on 10/5 neg so far. See above.  Septic Shock:secondary to HCAP/Pseudomonal Bacteremia. Sepsis Pathophysiology has resolved.Afebrile-Leukocytosis has resolved.  Abx/cultures as above. Main issue now is deconditioning and generalized weakness.  Diarrhea: Apparently has a history of C. difficile-recheck C. difficile PCR-if negative, suspect could be from  antibiotics or supplements.  Acute Encephalopathy:due to sepsis. Resolved.  LTJ:[id number] azotemia-resolved.  Thrombocytopenia:secondary to sepsis-improving. Follow periodically  Anemia:secondary to critical illness-Hb stable-follow periodically  Hypophosphotemia:resolved.   Hypokalemia: Repleted  Hx of Meningioma:s/p craniotomy and resection in 2014-getting XRT prior to this admit. Continue Decadron.  Hypothyroidism:continue Synthroid  Dysphagia/Odynophagia:Suspect dysphagia secondary to acute illness/debility-odynophagia ongoing prior to this admit-likely due to XRT/thrush-completed Diflucan.On Dysphagia 3 patient/family aware of aspiration risk  Steroid induced hyperglycemia:A1C 5.7-Continue SSI-follow  HTN: Continue Metoprolol-follow BP trend. Resume Amlodipine over the next few days  GERD:PPI  Dyslipidemia:resume Statin when more stable.  Deconditioning: Medical issues are stable and have plateaued-main issue now is deconditioning and generalized weakness. PT eval completed-recommending CIR- await CIR evaluation  Disposition: Remain inpatient-SNF tomorrow  Antimicrobial agents  See below  Anti-infectives    Start     Dose/Rate Route Frequency Ordered Stop   07/27/15 2200  cefTAZidime (FORTAZ) 2 g in dextrose 5 % 50 mL IVPB     2 g 100 mL/hr over 30 Minutes Intravenous Every 12 hours 07/27/15 1136 08/06/15 2159   07/26/15 1100  meropenem (MERREM) 1 g in sodium chloride 0.9 % 100 mL IVPB  Status:  Discontinued     1 g 200 mL/hr over 30 Minutes Intravenous Every 12 hours 07/26/15 1038 07/27/15 1136   07/25/15 2200  fluconazole (DIFLUCAN) IVPB 100 mg     100 mg 50 mL/hr over 60 Minutes Intravenous Daily at bedtime 07/24/15 2102 07/31/15 2317   07/25/15 1400  vancomycin (VANCOCIN) 500 mg in sodium chloride 0.9 % 100 mL IVPB  Status:  Discontinued     500 mg 100 mL/hr over 60 Minutes Intravenous Every 24 hours 07/24/15 1428 07/25/15 1011   07/25/15 1100  meropenem  (MERREM) 500 mg in sodium chloride 0.9 % 50 mL IVPB  Status:  Discontinued     500 mg 100 mL/hr over 30 Minutes  Intravenous Every 12 hours 07/25/15 1011 07/26/15 1038   07/24/15 2200  fluconazole (DIFLUCAN) IVPB 200 mg     200 mg 100 mL/hr over 60 Minutes Intravenous  Once 07/24/15 2058 07/25/15 0021   07/24/15 2200  fluconazole (DIFLUCAN) IVPB 100 mg  Status:  Discontinued     100 mg 50 mL/hr over 60 Minutes Intravenous Daily at bedtime 07/24/15 2058 07/24/15 2102   07/24/15 2000  piperacillin-tazobactam (ZOSYN) IVPB 3.375 g  Status:  Discontinued     3.375 g 12.5 mL/hr over 240 Minutes Intravenous Every 8 hours 07/24/15 1428 07/25/15 1011   07/24/15 1400  piperacillin-tazobactam (ZOSYN) IVPB 3.375 g     3.375 g 100 mL/hr over 30 Minutes Intravenous  Once 07/24/15 1347 07/24/15 1437   07/24/15 1400  vancomycin (VANCOCIN) IVPB 1000 mg/200 mL premix     1,000 mg 200 mL/hr over 60 Minutes Intravenous  Once 07/24/15 1347 07/24/15 1527      DVT Prophylaxis: Prophylactic Lovenox from 10/8-continue-as platelet improving  Code Status:  DNR  Family Communication Multiple family members at bedside.  Procedures: 10/03 >>IJ CVL  10/3>>10/6 ETT 10/6>>bronch LLL blood old friable, c/w necrotizing PNA  CONSULTS:  pulmonary/intensive care  Time spent 25 minutes-Greater than 50% of this time was spent in counseling, explanation of diagnosis, planning of further management, and coordination of care  MEDICATIONS: Scheduled Meds: . antiseptic oral rinse  7 mL Mouth Rinse QID  . cefTAZidime (FORTAZ)  IV  2 g Intravenous Q12H  . chlorhexidine gluconate  15 mL Mouth Rinse BID  . dexamethasone  4 mg Oral TID  . enoxaparin (LOVENOX) injection  40 mg Subcutaneous Q24H  . feeding supplement (NEPRO CARB STEADY)  237 mL Oral TID BM  . insulin aspart  0-9 Units Subcutaneous 6 times per day  . levothyroxine  75 mcg Oral QAC breakfast  . metoprolol  50 mg Oral BID  . pantoprazole  40 mg Oral  Daily   Continuous Infusions: . sodium chloride 250 mL (07/28/15 1300)   PRN Meds:.acetaminophen, ALPRAZolam, fentaNYL (SUBLIMAZE) injection, menthol-cetylpyridinium, RESOURCE THICKENUP CLEAR    PHYSICAL EXAM: Vital signs in last 24 hours: Filed Vitals:   08/01/15 0105 08/01/15 0439 08/01/15 0539 08/01/15 0947  BP: 152/57  139/56 143/60  Pulse: 73  75 98  Temp: 98.6 F (37 C)  97.7 F (36.5 C)   TempSrc: Axillary  Axillary   Resp: 16  16   Height:      Weight:  59.4 kg (130 lb 15.3 oz)    SpO2: 97%  94%     Weight change:  Filed Weights   07/28/15 0500 07/29/15 2000 08/01/15 0439  Weight: 62.8 kg (138 lb 7.2 oz) 65.5 kg (144 lb 6.4 oz) 59.4 kg (130 lb 15.3 oz)   Body mass index is 26.44 kg/(m^2).   Gen Exam: Awake and alert with clear speech. Appears chronically weak/deconditioned Neck: Supple,  Chest: B/L Clear anteriorly CVS: S1 S2 Regular Abdomen: soft, BS +, non tender, non distended.  Extremities: No edema, lower extremities warm to touch. Neurologic: Non Focal.   Skin: No Rash.   Wounds: N/A.    Intake/Output from previous day:  Intake/Output Summary (Last 24 hours) at 08/01/15 1024 Last data filed at 07/31/15 1757  Gross per 24 hour  Intake      0 ml  Output      1 ml  Net     -1 ml     LAB RESULTS: CBC  Recent  Labs Lab 07/26/15 0457 07/27/15 0610 07/28/15 0247 07/29/15 0502 07/30/15 0651  WBC 10.1 9.9 8.1 8.0 10.0  HGB 9.1* 9.3* 10.3* 10.5* 11.4*  HCT 27.7* 27.4* 31.0* 31.7* 33.9*  PLT 72* 78* 106* 114* 146*  MCV 83.9 85.6 85.9 86.1 85.2  MCH 27.6 29.1 28.5 28.5 28.6  MCHC 32.9 33.9 33.2 33.1 33.6  RDW 13.7 13.9 14.1 13.6 13.4  LYMPHSABS  --   --   --  0.6*  --   MONOABS  --   --   --  0.2  --   EOSABS  --   --   --  0.0  --   BASOSABS  --   --   --  0.0  --     Chemistries   Recent Labs Lab 07/27/15 1929 07/27/15 2200 07/28/15 0247 07/29/15 0502 07/30/15 0651 07/31/15 0540  NA 134*  --  138 135 136 134*  K 4.8  --  4.2  3.9 3.1* 4.2  CL 95*  --  96* 96* 89* 96*  CO2 29  --  31 29 34* 29  GLUCOSE 249*  --  164* 192* 153* 176*  BUN 11  --  14 22* 22* 24*  CREATININE 0.56  --  0.50 0.56 0.57 0.57  CALCIUM 8.1*  --  8.2* 8.5* 8.6* 8.5*  MG  --  1.9 1.8 2.0 1.6* 2.1    CBG:  Recent Labs Lab 07/31/15 1152 07/31/15 1658 07/31/15 1955 07/31/15 2338 08/01/15 0350  GLUCAP 187* 313* 194* 301* 183*    GFR Estimated Creatinine Clearance: 43.3 mL/min (by C-G formula based on Cr of 0.57).  Coagulation profile No results for input(s): INR, PROTIME in the last 168 hours.  Cardiac Enzymes No results for input(s): CKMB, TROPONINI, MYOGLOBIN in the last 168 hours.  Invalid input(s): CK  Invalid input(s): POCBNP No results for input(s): DDIMER in the last 72 hours. No results for input(s): HGBA1C in the last 72 hours. No results for input(s): CHOL, HDL, LDLCALC, TRIG, CHOLHDL, LDLDIRECT in the last 72 hours. No results for input(s): TSH, T4TOTAL, T3FREE, THYROIDAB in the last 72 hours.  Invalid input(s): FREET3 No results for input(s): VITAMINB12, FOLATE, FERRITIN, TIBC, IRON, RETICCTPCT in the last 72 hours. No results for input(s): LIPASE, AMYLASE in the last 72 hours.  Urine Studies No results for input(s): UHGB, CRYS in the last 72 hours.  Invalid input(s): UACOL, UAPR, USPG, UPH, UTP, UGL, UKET, UBIL, UNIT, UROB, ULEU, UEPI, UWBC, URBC, UBAC, CAST, UCOM, BILUA  MICROBIOLOGY: Recent Results (from the past 240 hour(s))  Blood Culture (routine x 2)     Status: None   Collection Time: 07/24/15  1:32 PM  Result Value Ref Range Status   Specimen Description BLOOD LEFT FOREARM  Final   Special Requests BOTTLES DRAWN AEROBIC AND ANAEROBIC 5CC  Final   Culture  Setup Time   Final    GRAM NEGATIVE RODS AEROBIC BOTTLE ONLY CRITICAL RESULT CALLED TO, READ BACK BY AND VERIFIED WITH: MINDY HOPPER @0451  07/25/15 MKELLY    Culture   Final    PSEUDOMONAS AERUGINOSA SUSCEPTIBILITIES PERFORMED ON PREVIOUS  CULTURE WITHIN THE LAST 5 DAYS.    Report Status 07/28/2015 FINAL  Final  Blood Culture (routine x 2)     Status: None   Collection Time: 07/24/15  1:35 PM  Result Value Ref Range Status   Specimen Description BLOOD RIGHT ARM  Final   Special Requests BOTTLES DRAWN AEROBIC AND ANAEROBIC 5CC  Final  Culture  Setup Time   Final    GRAM NEGATIVE RODS AEROBIC BOTTLE ONLY CRITICAL RESULT CALLED TO, READ BACK BY AND VERIFIED WITH: MINDY HOPPER @0451  07/25/15 MKELLY    Culture PSEUDOMONAS AERUGINOSA  Final   Report Status 07/27/2015 FINAL  Final   Organism ID, Bacteria PSEUDOMONAS AERUGINOSA  Final      Susceptibility   Pseudomonas aeruginosa - MIC*    CEFTAZIDIME 4 SENSITIVE Sensitive     CIPROFLOXACIN <=0.25 SENSITIVE Sensitive     GENTAMICIN <=1 SENSITIVE Sensitive     IMIPENEM 2 SENSITIVE Sensitive     PIP/TAZO 8 SENSITIVE Sensitive     CEFEPIME 2 SENSITIVE Sensitive     * PSEUDOMONAS AERUGINOSA  Urine culture     Status: None   Collection Time: 07/24/15  5:07 PM  Result Value Ref Range Status   Specimen Description URINE, CATHETERIZED  Final   Special Requests NONE  Final   Culture NO GROWTH 1 DAY  Final   Report Status 07/25/2015 FINAL  Final  Culture, respiratory (NON-Expectorated)     Status: None   Collection Time: 07/24/15  6:10 PM  Result Value Ref Range Status   Specimen Description TRACHEAL ASPIRATE  Final   Special Requests Immunocompromised  Final   Gram Stain   Final    ABUNDANT WBC PRESENT, PREDOMINANTLY PMN RARE SQUAMOUS EPITHELIAL CELLS PRESENT NO ORGANISMS SEEN Performed at [facility]    Culture   Final    ABUNDANT PSEUDOMONAS AERUGINOSA Performed at [facility]    Report Status 07/27/2015 FINAL  Final   Organism ID, Bacteria PSEUDOMONAS AERUGINOSA  Final      Susceptibility   Pseudomonas aeruginosa - MIC*    CEFEPIME 4 SENSITIVE Sensitive     CEFTAZIDIME 4 SENSITIVE Sensitive     CIPROFLOXACIN <=0.25 SENSITIVE Sensitive      GENTAMICIN <=1 SENSITIVE Sensitive     IMIPENEM 2 SENSITIVE Sensitive     PIP/TAZO 8 SENSITIVE Sensitive     TOBRAMYCIN <=1 SENSITIVE Sensitive     * ABUNDANT PSEUDOMONAS AERUGINOSA  MRSA PCR Screening     Status: None   Collection Time: 07/24/15  7:18 PM  Result Value Ref Range Status   MRSA by PCR NEGATIVE NEGATIVE Final    Comment:        The GeneXpert MRSA Assay (FDA approved for NASAL specimens only), is one component of a comprehensive MRSA colonization surveillance program. It is not intended to diagnose MRSA infection nor to guide or monitor treatment for MRSA infections.   Culture, blood (routine x 2)     Status: None   Collection Time: 07/26/15 12:30 PM  Result Value Ref Range Status   Specimen Description BLOOD RIGHT ANTECUBITAL  Final   Special Requests BOTTLES DRAWN AEROBIC AND ANAEROBIC 5CC  Final   Culture NO GROWTH 5 DAYS  Final   Report Status 07/31/2015 FINAL  Final  Culture, blood (routine x 2)     Status: None   Collection Time: 07/26/15 12:40 PM  Result Value Ref Range Status   Specimen Description BLOOD RIGHT HAND  Final   Special Requests BOTTLES DRAWN AEROBIC ONLY 5CC  Final   Culture NO GROWTH 5 DAYS  Final   Report Status 07/31/2015 FINAL  Final  C difficile quick scan w PCR reflex     Status: None   Collection Time: 07/26/15 11:59 PM  Result Value Ref Range Status   C Diff antigen NEGATIVE NEGATIVE Final   C  Diff toxin NEGATIVE NEGATIVE Final   C Diff interpretation Negative for toxigenic C. difficile  Final    RADIOLOGY STUDIES/RESULTS: Dg Chest 2 View  07/21/2015   CLINICAL DATA:  Weakness, no respiratory symptoms, possible history of COPD, known brain tumor.  EXAM: CHEST  2 VIEW  COMPARISON:  Chest x-ray of April 13, 2015  FINDINGS: The lungs are adequately inflated and clear. The heart is mildly enlarged but stable. The pulmonary vascularity is normal. The mediastinum is normal in width. There is no pleural effusion. There is a moderate-sized  hiatal hernia. The observed bony thorax exhibits no acute abnormality.  IMPRESSION: There is no active cardiopulmonary disease.   Electronically Signed   By: David  Martinique M.D.   On: 07/21/2015 17:15   Ct Head Wo Contrast  07/14/2015   ADDENDUM REPORT: 07/14/2015 20:40 ADDENDUM: Further information from the referring clinician that the patient cannot fully open her mouth. Further evaluation of the mandible shows some anterior subluxation of the mandibular and condyles with respect to the TMJ joint although no true dislocation is noted. These findings were discussed with Dr. Alvino Chapel. Electronically Signed   By: Inez Catalina M.D.   On: 07/14/2015 20:40  07/14/2015   CLINICAL DATA:  Fall today secondary to gait difficulties, known brain tumor  EXAM: CT HEAD WITHOUT CONTRAST  CT MAXILLOFACIAL WITHOUT CONTRAST  TECHNIQUE: Multidetector CT imaging of the head and maxillofacial structures were performed using the standard protocol without intravenous contrast. Multiplanar CT image reconstructions of the maxillofacial structures were also generated.  COMPARISON:  04/16/2015  FINDINGS: CT HEAD FINDINGS  The bony calvarium shows postsurgical changes on the left similar to that seen on the prior exam. Calcified meningioma is again noted in the right temporal region stable in appearance. Postsurgical changes are noted in the left frontal lobe. The patient's known falx meningioma E centric to the right is again seen and stable. The enhancing abnormality in the left frontal region is not as well appreciated on this exam as on the recent MRI. No findings to suggest acute hemorrhage or acute infarction are noted.  CT MAXILLOFACIAL FINDINGS  Bony structures are within normal limits. No acute fracture is seen. The paranasal sinuses and mastoid air cells are well aerated. The visualized portions of the cervical spine demonstrate mild degenerative change. Small mucosal retention cyst is noted within the left maxillary antrum.  Soft tissue structures show no acute abnormality. The orbits and their contents are unremarkable.  IMPRESSION: CT of the head: Postoperative changes.  Stable meningiomas as described.  CT of maxillofacial bones: No acute fracture noted. No significant soft tissue abnormality is noted.  Electronically Signed: By: Inez Catalina M.D. On: 07/14/2015 20:27   US Renal  07/25/2015   CLINICAL DATA:  Acute kidney injury  EXAM: RENAL / URINARY TRACT ULTRASOUND COMPLETE  COMPARISON:  None.  FINDINGS: Right Kidney:  Length: 10.6 cm. Echogenicity within normal limits. No mass or hydronephrosis visualized.  Left Kidney:  Length: 10.7 cm. Echogenicity within normal limits. No mass or hydronephrosis visualized.  Bladder:  Appears normal for degree of bladder distention.  IMPRESSION: 1. Normal exam.   Electronically Signed   By: Kerby Moors M.D.   On: 07/25/2015 14:10   Dg Chest Port 1 View  07/27/2015   CLINICAL DATA:  Volume excess  EXAM: PORTABLE CHEST 1 VIEW  COMPARISON:  07/26/2015  FINDINGS: Endotracheal tube, left IJ central line, and nasogastric tube are in place as before.  Heart contours are obscured.  Bibasilar opacities appear stable. Bilateral pleural effusions and bilateral lower lobe opacities are present, left greater than right.  IMPRESSION: Bilateral pleural effusions and bibasilar opacity, left greater than right.   Electronically Signed   By: Nolon Nations M.D.   On: 07/27/2015 13:36   Dg Chest Port 1 View  07/26/2015   CLINICAL DATA:  Pneumonia.  Shortness of breath.  EXAM: PORTABLE CHEST 1 VIEW  COMPARISON:  07/25/2015.  FINDINGS: Endotracheal tube, NG tube, left IJ line stable position. Heart size normal. Persistent left lower lobe infiltrate left pleural effusion. No pneumothorax.  IMPRESSION: 1. Lines and tubes in stable position. 2. Persistent left lower lobe infiltrate and left pleural effusion.   Electronically Signed   By: Marcello Moores  Register   On: 07/26/2015 07:11   Dg Chest Port 1  View  07/25/2015   CLINICAL DATA:  Hypoxia/respiratory failure  EXAM: PORTABLE CHEST 1 VIEW  COMPARISON:  July 24, 2015  FINDINGS: Endotracheal tube tip is 1.4 cm above the carina. Central catheter tip is at the junction of the left innominate vein and superior vena cava. Nasogastric tube tip and side port are below the diaphragm. No pneumothorax. There is airspace consolidation in the left lower lobe with small left effusion. Lungs elsewhere clear. Heart size and pulmonary vascularity are normal. No adenopathy.  IMPRESSION: Tube and catheter positions as described without pneumothorax. Left lower lobe consolidation with left effusion. Lungs elsewhere clear.   Electronically Signed   By: Lowella Grip III M.D.   On: 07/25/2015 07:16   Dg Chest Portable 1 View  07/24/2015   CLINICAL DATA:  Brain tumor receiving treatment, weakness at home, near syncopal episode  EXAM: PORTABLE CHEST 1 VIEW  COMPARISON:  Portable exam 1721 hours compared to 1317 hours  FINDINGS: Tip of endotracheal tube projects 3.1 cm above carina.  Nasogastric tube extends into stomach.  LEFT jugular central venous catheter tip projects over SVC.  Slight rotation to the RIGHT.  Normal heart size mediastinal contours.  Atelectasis versus consolidation in LEFT lower lobe with associated LEFT basilar effusion.  Remaining lungs clear.  No pneumothorax.  IMPRESSION: Line and tube positions as above.  LEFT pleural effusion with atelectasis versus consolidation in LEFT lower lobe.   Electronically Signed   By: Lavonia Dana M.D.   On: 07/24/2015 17:34   Dg Chest Port 1 View  07/24/2015   CLINICAL DATA:  Shortness of breath, weakness, near syncopal episode  EXAM: PORTABLE CHEST 1 VIEW  COMPARISON:  07/21/2015  FINDINGS: New hazy opacity over the left lower chest obscuring the left hemidiaphragm compatible with left basilar airspace process and associated left pleural effusion layering posteriorly. Basilar pneumonia not excluded. Right lung  remains clear. Mild cardiac enlargement with normal vascularity. No superimposed edema. Negative for pneumothorax. Monitor leads overlie the chest. Bones are osteopenic.  IMPRESSION: New left basilar opacity and likely posterior layering effusion. Appearance compatible with pneumonia.  Recommend radiographic follow-up to document resolution.   Electronically Signed   By: Jerilynn Mages.  Shick M.D.   On: 07/24/2015 13:34   Ct Maxillofacial Wo Cm  07/14/2015   ADDENDUM REPORT: 07/14/2015 20:40 ADDENDUM: Further information from the referring clinician that the patient cannot fully open her mouth. Further evaluation of the mandible shows some anterior subluxation of the mandibular and condyles with respect to the TMJ joint although no true dislocation is noted. These findings were discussed with Dr. Alvino Chapel. Electronically Signed   By: Inez Catalina M.D.   On: 07/14/2015 20:40  07/14/2015   CLINICAL DATA:  Fall today secondary to gait difficulties, known brain tumor  EXAM: CT HEAD WITHOUT CONTRAST  CT MAXILLOFACIAL WITHOUT CONTRAST  TECHNIQUE: Multidetector CT imaging of the head and maxillofacial structures were performed using the standard protocol without intravenous contrast. Multiplanar CT image reconstructions of the maxillofacial structures were also generated.  COMPARISON:  04/16/2015  FINDINGS: CT HEAD FINDINGS  The bony calvarium shows postsurgical changes on the left similar to that seen on the prior exam. Calcified meningioma is again noted in the right temporal region stable in appearance. Postsurgical changes are noted in the left frontal lobe. The patient's known falx meningioma E centric to the right is again seen and stable. The enhancing abnormality in the left frontal region is not as well appreciated on this exam as on the recent MRI. No findings to suggest acute hemorrhage or acute infarction are noted.  CT MAXILLOFACIAL FINDINGS  Bony structures are within normal limits. No acute fracture is seen. The  paranasal sinuses and mastoid air cells are well aerated. The visualized portions of the cervical spine demonstrate mild degenerative change. Small mucosal retention cyst is noted within the left maxillary antrum. Soft tissue structures show no acute abnormality. The orbits and their contents are unremarkable.  IMPRESSION: CT of the head: Postoperative changes.  Stable meningiomas as described.  CT of maxillofacial bones: No acute fracture noted. No significant soft tissue abnormality is noted.  Electronically Signed: By: Inez Catalina M.D. On: 07/14/2015 20:27   Ct Portable Head W/o Cm  07/25/2015   CLINICAL DATA:  Encephalopathy.  EXAM: CT HEAD WITHOUT CONTRAST  TECHNIQUE: Contiguous axial images were obtained from the base of the skull through the vertex without intravenous contrast.  COMPARISON:  CT scan of July 14, 2015.  FINDINGS: Status post left parietal craniotomy. Left frontal encephalomalacia is again noted. Stable meningioma is noted in the right parafalcine region. No mass effect or midline shift is noted. Ventricular size is within normal limits. There is no evidence of hemorrhage or acute infarction. Stable calcified meningioma is noted in right temporal region.  IMPRESSION: Stable right-sided meningioma as an left-sided encephalomalacia is noted compared to prior exam. No significant changes noted compared to prior exam.   Electronically Signed   By: Marijo Conception, M.D.   On: 07/25/2015 21:21    Oren Binet, MD  [facility] Pager:[phone]  If 7PM-7AM, please contact night-coverage [email or web address] Password [email or web address] 08/01/2015, 10:24 AM   LOS: 8 days

## 2015-08-02 LAB — GLUCOSE, CAPILLARY: GLUCOSE-CAPILLARY: 142 mg/dL — AB (ref 65–99)

## 2015-08-02 MED ORDER — LEVOFLOXACIN 750 MG PO TABS: 750.0000 mg | ORAL_TABLET | ORAL | Status: DC

## 2015-08-02 MED ORDER — NEPRO/CARBSTEADY PO LIQD: 237.0000 mL | Freq: Three times a day (TID) | ORAL | Status: AC

## 2015-08-02 MED ORDER — ALPRAZOLAM 1 MG PO TABS: 1.0000 mg | ORAL_TABLET | Freq: Two times a day (BID) | ORAL | Status: AC | PRN

## 2015-08-02 MED ORDER — MENTHOL 3 MG MT LOZG: 1.0000 | LOZENGE | OROMUCOSAL | Status: DC | PRN

## 2015-08-02 NOTE — Progress Notes (Deleted)
Physical Therapy Treatment Patient Details Name: Meghan Yu MRN: [id number] DOB: 1932-01-02 Today's Date: 08/02/2015    History of Present Illness 81 yo female presented with cough, dyspnea, weakness, incontinence from PNA, sepsis. Radiation for meningioma. MD reports necrotizing pneumonia. Pt intubated from 10/3 to 10/6. Now DNR/DNI.     PT Comments    Patient progressing with ambulation and balance this session.  Walked much increased distance with one seated rest and overall min assist (met goal for ambulation).  Do note she fatigues and then balance is worse as well as c/o back pain with quick movements.    Follow Up Recommendations  SNF     Equipment Recommendations  Rolling walker with 5" wheels (TBA at SNF)    Recommendations for Other Services       Precautions / Restrictions Precautions Precautions: Fall Restrictions Weight Bearing Restrictions: No    Mobility  Bed Mobility Overal bed mobility: Needs Assistance Bed Mobility: Sit to Supine Rolling: Supervision Sidelying to sit: Supervision   Sit to supine: Mod assist   General bed mobility comments: increased time and with assist of bed rail with HOB elevated; assist for legs into bed and for repositioning in supine  Transfers Overall transfer level: Needs assistance Equipment used: Rolling walker (2 wheeled) Transfers: Sit to/from Stand Sit to Stand: Mod assist         General transfer comment: increased time with lifting assist from bed and armchair, cues for hand placement as pt trying to pull up on walker  Ambulation/Gait Ambulation/Gait assistance: Min guard;Min assist Ambulation Distance (Feet): 75 Feet (x 2) Assistive device: Rolling walker (2 wheeled) Gait Pattern/deviations: Step-through pattern;Narrow base of support;Shuffle;Leaning posteriorly     General Gait Details: as she fatigued noted increased posterior leaning   Stairs            Wheelchair Mobility    Modified Rankin  (Stroke Patients Only) Modified Rankin (Stroke Patients Only) Pre-Morbid Rankin Score: Moderately severe disability Modified Rankin: Moderately severe disability     Balance Overall balance assessment: Needs assistance     Sitting balance - Comments: initially sitting unsupported supervision, after ambulation with fatigue needed min-guard for balance due to posterior bias at edge of bed   Standing balance support: Bilateral upper extremity supported Standing balance-Leahy Scale: Poor Standing balance comment: UE support for balance                    Cognition Arousal/Alertness: Awake/alert Behavior During Therapy: Flat affect Overall Cognitive Status: Impaired/Different from baseline Area of Impairment: Safety/judgement;Memory     Memory: Decreased short-term memory Following Commands: Follows one step commands with increased time            Exercises      General Comments        Pertinent Vitals/Pain Pain Assessment: Faces Faces Pain Scale: Hurts little more Pain Location: lower back with certain movement Pain Descriptors / Indicators: Aching Pain Intervention(s): Monitored during session;Repositioned    Home Living                      Prior Function            PT Goals (current goals can now be found in the care plan section) Progress towards PT goals: Progressing toward goals    Frequency  Min 3X/week    PT Plan Discharge plan needs to be updated    Co-evaluation  End of Session Equipment Utilized During Treatment: Gait belt Activity Tolerance: Patient tolerated treatment well Patient left: in bed;with call bell/phone within reach;with family/visitor present     Time: 4462-8638 PT Time Calculation (min) (ACUTE ONLY): 27 min  Charges:  $Gait Training: 8-22 mins $Therapeutic Activity: 8-22 mins                    G Codes:      Aarnav Steagall,CYNDI 09/01/2015, 11:25 AM  Magda Kiel, [place] September 01, 2015

## 2015-08-02 NOTE — Clinical Social Work Placement (Signed)
   CLINICAL SOCIAL WORK PLACEMENT  NOTE  Date:  08/02/2015  Patient Details  Name: Meghan Yu MRN: [id number] Date of Birth: Sep 14, 1932  Clinical Social Work is seeking post-discharge placement for this patient at the [place] level of care (*CSW will initial, date and re-position this form in  chart as items are completed):  Yes   Patient/family provided with [facility] Work Department's list of facilities offering this level of care within the geographic area requested by the patient (or if unable, by the patient's family).  Yes   Patient/family informed of their freedom to choose among providers that offer the needed level of care, that participate in Medicare, Medicaid or managed care program needed by the patient, have an available bed and are willing to accept the patient.  Yes   Patient/family informed of [facility] ownership interest in [facility] and [facility], as well as of the fact that they are under no obligation to receive care at these facilities.  PASRR submitted to EDS on 08/01/15     PASRR number received on 08/01/15     Existing PASRR number confirmed on       FL2 transmitted to all facilities in geographic area requested by pt/family on 08/01/15     FL2 transmitted to all facilities within larger geographic area on       Patient informed that his/her managed care company has contracts with or will negotiate with certain facilities, including the following:        Yes   Patient/family informed of bed offers received.  Patient chooses bed at [facility]     Physician recommends and patient chooses bed at      Patient to be transferred to [facility] on 08/02/15.  Patient to be transferred to facility by [facility]      Patient family notified on 08/02/15 of transfer.  Name of family member notified:  Lilleigh Hechavarria, daughter      PHYSICIAN       Additional Comment:     _______________________________________________ Greta Doom, LCSW 08/02/2015, 10:25 AM

## 2015-08-02 NOTE — Progress Notes (Signed)
Pt discharged to [facility], skilled nursing facility. Discharge instructions given and IV discontinued. Left unit via [facility] at 1145am. Attempted to call report to facility, left multiple messages for nursing and director of nursing. Wendee Copp

## 2015-08-02 NOTE — Discharge Summary (Signed)
PATIENT DETAILS Name: Meghan Yu Age: 81 y.o. Sex: female Date of Birth: [birth year] MRN: [id number]. Admitting Physician: [name], MD JSE:[id number],OHYWVP, MD  Admit Date: 07/24/2015 Discharge date: 08/02/2015  Recommendations for Outpatient Follow-up:  1. Consider palliative care evaluation while at SNF 2. Please repeat CBC/BMET in 1 week 3. Please speech therapy follow patient while at SNF   PRIMARY DISCHARGE DIAGNOSIS:  Active Problems:   HCAP (healthcare-associated pneumonia)   Pressure ulcer   AKI (acute kidney injury) ([place])   Encephalopathy   Pneumonia   Pneumonia due to Pseudomonas species [facility])      PAST MEDICAL HISTORY: Past Medical History  Diagnosis Date  . Hyperlipidemia   . Anxiety disorder   . Arthritis of knee, right     Needs replacement/Dr. Sharol Given  . Colon polyps     colonoscopy by Dr. Wynetta Emery  . Brain cancer ([place]) 03/30/13    Left frontal meningioma  . Hypothyroidism     hypothyroidism  . UTI (lower urinary tract infection) 2014    HX e. coli  . Degenerative arthritis of hand     bilateral  . Sepsis ([place])   . SVT (supraventricular tachycardia) ([place]) 2014    DISCHARGE MEDICATIONS: Current Discharge Medication List    START taking these medications   Details  levofloxacin (LEVAQUIN) 750 MG tablet Take 1 tablet (750 mg total) by mouth every other day. Last dose on 10/15 and then discontinue    menthol-cetylpyridinium (CEPACOL) 3 MG lozenge Take 1 lozenge (3 mg total) by mouth as needed for sore throat.    Nutritional Supplements (FEEDING SUPPLEMENT, NEPRO CARB STEADY,) LIQD Take 237 mLs by mouth 3 (three) times daily between meals. Refills: 0      CONTINUE these medications which have CHANGED   Details  ALPRAZolam (XANAX) 1 MG tablet Take 1 tablet (1 mg total) by mouth 2 (two) times daily as needed for sleep or anxiety. Qty: 15 tablet, Refills: 0      CONTINUE these medications which have NOT CHANGED   Details  amLODipine (NORVASC)  5 MG tablet Take 5 mg by mouth daily.    calcium carbonate (OS-CAL) 600 MG TABS Take 600 mg by mouth 2 (two) times daily with a meal.    cetirizine (ZYRTEC) 10 MG tablet Take 10 mg by mouth daily.   Associated Diagnoses: Meningioma, recurrent of brain ([place]); Atypical meningioma of brain ([facility])    Cholecalciferol (VITAMIN D) 2000 UNITS CAPS Take 2,000 Units by mouth daily.     citalopram (CELEXA) 10 MG tablet Take 10 mg by mouth daily.     dexamethasone (DECADRON) 4 MG tablet Take 1 tablet (4 mg total) by mouth 3 (three) times daily. Qty: 90 tablet, Refills: 0    levothyroxine (SYNTHROID, LEVOTHROID) 75 MCG tablet Take 1 tablet (75 mcg total) by mouth daily before breakfast. Qty: 30 tablet, Refills: 1    lidocaine (XYLOCAINE) 2 % solution Patient: Mix 1part 2% viscous lidocaine, 1part H20. Swish and/or swallow 50mL of this mixture, 37min before meals and at bedtime, up to QID Qty: 100 mL, Refills: 5   Associated Diagnoses: Atypical meningioma of brain ([facility])    metoprolol (LOPRESSOR) 50 MG tablet Take 1 tablet (50 mg total) by mouth every 12 (twelve) hours. Qty: 60 tablet, Refills: 6    Multiple Vitamin (MULTIVITAMIN WITH MINERALS) TABS Take 1 tablet by mouth daily.    Omega-3 Fatty Acids (FISH OIL) 1000 MG CAPS Take 1-2 capsules (1,000-2,000 mg total) by mouth 2 (two)  times daily. Takes 2000 mg in the morning and 1000 mg in the evening Qty: 60 capsule, Refills: 0    omeprazole (PRILOSEC) 40 MG capsule Take 40 mg by mouth daily.     prochlorperazine (COMPAZINE) 10 MG tablet Take 10 mg by mouth every 6 (six) hours as needed for nausea or vomiting.    senna-docusate (SENOKOT-S) 8.6-50 MG per tablet Take 1 tablet by mouth at bedtime. Qty: 2 tablet, Refills: 0    simvastatin (ZOCOR) 20 MG tablet Take 1 tablet (20 mg total) by mouth every evening. Qty: 30 tablet, Refills: 1      STOP taking these medications     clotrimazole (MYCELEX) 10 MG troche      fluconazole (DIFLUCAN) 100  MG tablet      ibuprofen (ADVIL,MOTRIN) 200 MG tablet      ciprofloxacin (CIPRO) 500 MG tablet      levETIRAcetam (KEPPRA) 500 MG tablet      loratadine (CLARITIN) 10 MG tablet      magnesium chloride (SLOW-MAG) 64 MG TBEC         ALLERGIES:  No Known Allergies  BRIEF HPI:  See H&P, Labs, Consult and Test reports for all details in brief, 81yo female with hx of Meningioma-s/p craniotomy in 2014-undergoing XRT prior to this admit-presented with cough/fever/weakness-found to have PNA with septic shock. Developed worsening respiratory failure and intubated on 10/3.  CONSULTATIONS:   pulmonary/intensive care  PERTINENT RADIOLOGIC STUDIES: Dg Chest 2 View  07/21/2015  CLINICAL DATA:  Weakness, no respiratory symptoms, possible history of COPD, known brain tumor. EXAM: CHEST  2 VIEW COMPARISON:  Chest x-ray of April 13, 2015 FINDINGS: The lungs are adequately inflated and clear. The heart is mildly enlarged but stable. The pulmonary vascularity is normal. The mediastinum is normal in width. There is no pleural effusion. There is a moderate-sized hiatal hernia. The observed bony thorax exhibits no acute abnormality. IMPRESSION: There is no active cardiopulmonary disease. Electronically Signed   By: David  Martinique M.D.   On: 07/21/2015 17:15   Ct Head Wo Contrast  07/14/2015  ADDENDUM REPORT: 07/14/2015 20:40 ADDENDUM: Further information from the referring clinician that the patient cannot fully open her mouth. Further evaluation of the mandible shows some anterior subluxation of the mandibular and condyles with respect to the TMJ joint although no true dislocation is noted. These findings were discussed with Dr. Alvino Chapel. Electronically Signed   By: Inez Catalina M.D.   On: 07/14/2015 20:40  07/14/2015  CLINICAL DATA:  Fall today secondary to gait difficulties, known brain tumor EXAM: CT HEAD WITHOUT CONTRAST CT MAXILLOFACIAL WITHOUT CONTRAST TECHNIQUE: Multidetector CT imaging of the head and  maxillofacial structures were performed using the standard protocol without intravenous contrast. Multiplanar CT image reconstructions of the maxillofacial structures were also generated. COMPARISON:  04/16/2015 FINDINGS: CT HEAD FINDINGS The bony calvarium shows postsurgical changes on the left similar to that seen on the prior exam. Calcified meningioma is again noted in the right temporal region stable in appearance. Postsurgical changes are noted in the left frontal lobe. The patient's known falx meningioma E centric to the right is again seen and stable. The enhancing abnormality in the left frontal region is not as well appreciated on this exam as on the recent MRI. No findings to suggest acute hemorrhage or acute infarction are noted. CT MAXILLOFACIAL FINDINGS Bony structures are within normal limits. No acute fracture is seen. The paranasal sinuses and mastoid air cells are well aerated. The visualized portions  of the cervical spine demonstrate mild degenerative change. Small mucosal retention cyst is noted within the left maxillary antrum. Soft tissue structures show no acute abnormality. The orbits and their contents are unremarkable. IMPRESSION: CT of the head: Postoperative changes. Stable meningiomas as described. CT of maxillofacial bones: No acute fracture noted. No significant soft tissue abnormality is noted. Electronically Signed: By: Inez Catalina M.D. On: 07/14/2015 20:27   US Renal  07/25/2015  CLINICAL DATA:  Acute kidney injury EXAM: RENAL / URINARY TRACT ULTRASOUND COMPLETE COMPARISON:  None. FINDINGS: Right Kidney: Length: 10.6 cm. Echogenicity within normal limits. No mass or hydronephrosis visualized. Left Kidney: Length: 10.7 cm. Echogenicity within normal limits. No mass or hydronephrosis visualized. Bladder: Appears normal for degree of bladder distention. IMPRESSION: 1. Normal exam. Electronically Signed   By: Kerby Moors M.D.   On: 07/25/2015 14:10   Dg Chest Port 1  View  07/27/2015  CLINICAL DATA:  Volume excess EXAM: PORTABLE CHEST 1 VIEW COMPARISON:  07/26/2015 FINDINGS: Endotracheal tube, left IJ central line, and nasogastric tube are in place as before. Heart contours are obscured. Bibasilar opacities appear stable. Bilateral pleural effusions and bilateral lower lobe opacities are present, left greater than right. IMPRESSION: Bilateral pleural effusions and bibasilar opacity, left greater than right. Electronically Signed   By: Nolon Nations M.D.   On: 07/27/2015 13:36   Dg Chest Port 1 View  07/26/2015  CLINICAL DATA:  Pneumonia.  Shortness of breath. EXAM: PORTABLE CHEST 1 VIEW COMPARISON:  07/25/2015. FINDINGS: Endotracheal tube, NG tube, left IJ line stable position. Heart size normal. Persistent left lower lobe infiltrate left pleural effusion. No pneumothorax. IMPRESSION: 1. Lines and tubes in stable position. 2. Persistent left lower lobe infiltrate and left pleural effusion. Electronically Signed   By: Marcello Moores  Register   On: 07/26/2015 07:11   Dg Chest Port 1 View  07/25/2015  CLINICAL DATA:  Hypoxia/respiratory failure EXAM: PORTABLE CHEST 1 VIEW COMPARISON:  July 24, 2015 FINDINGS: Endotracheal tube tip is 1.4 cm above the carina. Central catheter tip is at the junction of the left innominate vein and superior vena cava. Nasogastric tube tip and side port are below the diaphragm. No pneumothorax. There is airspace consolidation in the left lower lobe with small left effusion. Lungs elsewhere clear. Heart size and pulmonary vascularity are normal. No adenopathy. IMPRESSION: Tube and catheter positions as described without pneumothorax. Left lower lobe consolidation with left effusion. Lungs elsewhere clear. Electronically Signed   By: Lowella Grip III M.D.   On: 07/25/2015 07:16   Dg Chest Portable 1 View  07/24/2015  CLINICAL DATA:  Brain tumor receiving treatment, weakness at home, near syncopal episode EXAM: PORTABLE CHEST 1 VIEW  COMPARISON:  Portable exam 1721 hours compared to 1317 hours FINDINGS: Tip of endotracheal tube projects 3.1 cm above carina. Nasogastric tube extends into stomach. LEFT jugular central venous catheter tip projects over SVC. Slight rotation to the RIGHT. Normal heart size mediastinal contours. Atelectasis versus consolidation in LEFT lower lobe with associated LEFT basilar effusion. Remaining lungs clear. No pneumothorax. IMPRESSION: Line and tube positions as above. LEFT pleural effusion with atelectasis versus consolidation in LEFT lower lobe. Electronically Signed   By: Lavonia Dana M.D.   On: 07/24/2015 17:34   Dg Chest Port 1 View  07/24/2015  CLINICAL DATA:  Shortness of breath, weakness, near syncopal episode EXAM: PORTABLE CHEST 1 VIEW COMPARISON:  07/21/2015 FINDINGS: New hazy opacity over the left lower chest obscuring the left hemidiaphragm compatible with  left basilar airspace process and associated left pleural effusion layering posteriorly. Basilar pneumonia not excluded. Right lung remains clear. Mild cardiac enlargement with normal vascularity. No superimposed edema. Negative for pneumothorax. Monitor leads overlie the chest. Bones are osteopenic. IMPRESSION: New left basilar opacity and likely posterior layering effusion. Appearance compatible with pneumonia. Recommend radiographic follow-up to document resolution. Electronically Signed   By: Jerilynn Mages.  Shick M.D.   On: 07/24/2015 13:34   Ct Maxillofacial Wo Cm  07/14/2015  ADDENDUM REPORT: 07/14/2015 20:40 ADDENDUM: Further information from the referring clinician that the patient cannot fully open her mouth. Further evaluation of the mandible shows some anterior subluxation of the mandibular and condyles with respect to the TMJ joint although no true dislocation is noted. These findings were discussed with Dr. Alvino Chapel. Electronically Signed   By: Inez Catalina M.D.   On: 07/14/2015 20:40  07/14/2015  CLINICAL DATA:  Fall today secondary to gait  difficulties, known brain tumor EXAM: CT HEAD WITHOUT CONTRAST CT MAXILLOFACIAL WITHOUT CONTRAST TECHNIQUE: Multidetector CT imaging of the head and maxillofacial structures were performed using the standard protocol without intravenous contrast. Multiplanar CT image reconstructions of the maxillofacial structures were also generated. COMPARISON:  04/16/2015 FINDINGS: CT HEAD FINDINGS The bony calvarium shows postsurgical changes on the left similar to that seen on the prior exam. Calcified meningioma is again noted in the right temporal region stable in appearance. Postsurgical changes are noted in the left frontal lobe. The patient's known falx meningioma E centric to the right is again seen and stable. The enhancing abnormality in the left frontal region is not as well appreciated on this exam as on the recent MRI. No findings to suggest acute hemorrhage or acute infarction are noted. CT MAXILLOFACIAL FINDINGS Bony structures are within normal limits. No acute fracture is seen. The paranasal sinuses and mastoid air cells are well aerated. The visualized portions of the cervical spine demonstrate mild degenerative change. Small mucosal retention cyst is noted within the left maxillary antrum. Soft tissue structures show no acute abnormality. The orbits and their contents are unremarkable. IMPRESSION: CT of the head: Postoperative changes. Stable meningiomas as described. CT of maxillofacial bones: No acute fracture noted. No significant soft tissue abnormality is noted. Electronically Signed: By: Inez Catalina M.D. On: 07/14/2015 20:27   Ct Portable Head W/o Cm  07/25/2015  CLINICAL DATA:  Encephalopathy. EXAM: CT HEAD WITHOUT CONTRAST TECHNIQUE: Contiguous axial images were obtained from the base of the skull through the vertex without intravenous contrast. COMPARISON:  CT scan of July 14, 2015. FINDINGS: Status post left parietal craniotomy. Left frontal encephalomalacia is again noted. Stable meningioma  is noted in the right parafalcine region. No mass effect or midline shift is noted. Ventricular size is within normal limits. There is no evidence of hemorrhage or acute infarction. Stable calcified meningioma is noted in right temporal region. IMPRESSION: Stable right-sided meningioma as an left-sided encephalomalacia is noted compared to prior exam. No significant changes noted compared to prior exam. Electronically Signed   By: Marijo Conception, M.D.   On: 07/25/2015 21:21     PERTINENT LAB RESULTS: CBC: No results for input(s): WBC, HGB, HCT, PLT in the last 72 hours. CMET CMP     Component Value Date/Time   NA 134* 07/31/2015 0540   NA 135* 07/18/2015 1506   K 4.2 07/31/2015 0540   K 3.8 07/18/2015 1506   CL 96* 07/31/2015 0540   CO2 29 07/31/2015 0540   CO2 23 07/18/2015 1506  GLUCOSE 176* 07/31/2015 0540   GLUCOSE 158* 07/18/2015 1506   BUN 24* 07/31/2015 0540   BUN 42.3* 07/18/2015 1506   CREATININE 0.57 07/31/2015 0540   CREATININE 0.8 07/18/2015 1506   CALCIUM 8.5* 07/31/2015 0540   CALCIUM 9.0 07/18/2015 1506   PROT 5.5* 07/30/2015 0651   ALBUMIN 2.1* 07/30/2015 0651   AST 17 07/30/2015 0651   ALT 20 07/30/2015 0651   ALKPHOS 75 07/30/2015 0651   BILITOT 0.6 07/30/2015 0651   GFRNONAA >60 07/31/2015 0540   GFRAA >60 07/31/2015 0540    GFR Estimated Creatinine Clearance: 44.3 mL/min (by C-G formula based on Cr of 0.57). No results for input(s): LIPASE, AMYLASE in the last 72 hours. No results for input(s): CKTOTAL, CKMB, CKMBINDEX, TROPONINI in the last 72 hours. Invalid input(s): POCBNP No results for input(s): DDIMER in the last 72 hours. No results for input(s): HGBA1C in the last 72 hours. No results for input(s): CHOL, HDL, LDLCALC, TRIG, CHOLHDL, LDLDIRECT in the last 72 hours. No results for input(s): TSH, T4TOTAL, T3FREE, THYROIDAB in the last 72 hours.  Invalid input(s): FREET3 No results for input(s): VITAMINB12, FOLATE, FERRITIN, TIBC, IRON,  RETICCTPCT in the last 72 hours. Coags: No results for input(s): INR in the last 72 hours.  Invalid input(s): PT Microbiology: Recent Results (from the past 240 hour(s))  Blood Culture (routine x 2)     Status: None   Collection Time: 07/24/15  1:32 PM  Result Value Ref Range Status   Specimen Description BLOOD LEFT FOREARM  Final   Special Requests BOTTLES DRAWN AEROBIC AND ANAEROBIC 5CC  Final   Culture  Setup Time   Final    GRAM NEGATIVE RODS AEROBIC BOTTLE ONLY CRITICAL RESULT CALLED TO, READ BACK BY AND VERIFIED WITH: MINDY HOPPER @0451  07/25/15 MKELLY    Culture   Final    PSEUDOMONAS AERUGINOSA SUSCEPTIBILITIES PERFORMED ON PREVIOUS CULTURE WITHIN THE LAST 5 DAYS.    Report Status 07/28/2015 FINAL  Final  Blood Culture (routine x 2)     Status: None   Collection Time: 07/24/15  1:35 PM  Result Value Ref Range Status   Specimen Description BLOOD RIGHT ARM  Final   Special Requests BOTTLES DRAWN AEROBIC AND ANAEROBIC 5CC  Final   Culture  Setup Time   Final    GRAM NEGATIVE RODS AEROBIC BOTTLE ONLY CRITICAL RESULT CALLED TO, READ BACK BY AND VERIFIED WITH: MINDY HOPPER @0451  07/25/15 MKELLY    Culture PSEUDOMONAS AERUGINOSA  Final   Report Status 07/27/2015 FINAL  Final   Organism ID, Bacteria PSEUDOMONAS AERUGINOSA  Final      Susceptibility   Pseudomonas aeruginosa - MIC*    CEFTAZIDIME 4 SENSITIVE Sensitive     CIPROFLOXACIN <=0.25 SENSITIVE Sensitive     GENTAMICIN <=1 SENSITIVE Sensitive     IMIPENEM 2 SENSITIVE Sensitive     PIP/TAZO 8 SENSITIVE Sensitive     CEFEPIME 2 SENSITIVE Sensitive     * PSEUDOMONAS AERUGINOSA  Urine culture     Status: None   Collection Time: 07/24/15  5:07 PM  Result Value Ref Range Status   Specimen Description URINE, CATHETERIZED  Final   Special Requests NONE  Final   Culture NO GROWTH 1 DAY  Final   Report Status 07/25/2015 FINAL  Final  Culture, respiratory (NON-Expectorated)     Status: None   Collection Time: 07/24/15   6:10 PM  Result Value Ref Range Status   Specimen Description TRACHEAL ASPIRATE  Final  Special Requests Immunocompromised  Final   Gram Stain   Final    ABUNDANT WBC PRESENT, PREDOMINANTLY PMN RARE SQUAMOUS EPITHELIAL CELLS PRESENT NO ORGANISMS SEEN Performed at [facility]    Culture   Final    ABUNDANT PSEUDOMONAS AERUGINOSA Performed at [facility]    Report Status 07/27/2015 FINAL  Final   Organism ID, Bacteria PSEUDOMONAS AERUGINOSA  Final      Susceptibility   Pseudomonas aeruginosa - MIC*    CEFEPIME 4 SENSITIVE Sensitive     CEFTAZIDIME 4 SENSITIVE Sensitive     CIPROFLOXACIN <=0.25 SENSITIVE Sensitive     GENTAMICIN <=1 SENSITIVE Sensitive     IMIPENEM 2 SENSITIVE Sensitive     PIP/TAZO 8 SENSITIVE Sensitive     TOBRAMYCIN <=1 SENSITIVE Sensitive     * ABUNDANT PSEUDOMONAS AERUGINOSA  MRSA PCR Screening     Status: None   Collection Time: 07/24/15  7:18 PM  Result Value Ref Range Status   MRSA by PCR NEGATIVE NEGATIVE Final    Comment:        The GeneXpert MRSA Assay (FDA approved for NASAL specimens only), is one component of a comprehensive MRSA colonization surveillance program. It is not intended to diagnose MRSA infection nor to guide or monitor treatment for MRSA infections.   Culture, blood (routine x 2)     Status: None   Collection Time: 07/26/15 12:30 PM  Result Value Ref Range Status   Specimen Description BLOOD RIGHT ANTECUBITAL  Final   Special Requests BOTTLES DRAWN AEROBIC AND ANAEROBIC 5CC  Final   Culture NO GROWTH 5 DAYS  Final   Report Status 07/31/2015 FINAL  Final  Culture, blood (routine x 2)     Status: None   Collection Time: 07/26/15 12:40 PM  Result Value Ref Range Status   Specimen Description BLOOD RIGHT HAND  Final   Special Requests BOTTLES DRAWN AEROBIC ONLY 5CC  Final   Culture NO GROWTH 5 DAYS  Final   Report Status 07/31/2015 FINAL  Final  C difficile quick scan w PCR reflex     Status: None    Collection Time: 07/26/15 11:59 PM  Result Value Ref Range Status   C Diff antigen NEGATIVE NEGATIVE Final   C Diff toxin NEGATIVE NEGATIVE Final   C Diff interpretation Negative for toxigenic C. difficile  Final  C difficile quick scan w PCR reflex     Status: None   Collection Time: 08/01/15 11:18 AM  Result Value Ref Range Status   C Diff antigen NEGATIVE NEGATIVE Final   C Diff toxin NEGATIVE NEGATIVE Final   C Diff interpretation Negative for toxigenic C. difficile  Final     BRIEF HOSPITAL COURSE:  Brief Narrative: 81yo female with hx of Meningioma-s/p craniotomy in 2014-undergoing XRT prior to this admit-presented with cough/fever/weakness-found to have PNA with septic shock. Developed worsening respiratory failure and intubated on 10/3. Tracheal aspirate/Blood cultures positive for Pseudomonas. Managed by PCCM in the ICU-extubated on 10/6 per her wishes. DNR status-transferred to [facility] on 10/8.See below for further details.  Major events: 10/03 Admit, VDRF 10/3- shock, pressors 10/5- awake alert, cooperative 10/6- bronch LLL blood old friable, c/w necrotizing PNA 10/6- extubated per pt wishes-DNR 10/8-transfer to [facility] 10/11-changed from IV Fortaz or Oral Levaquin 10/12-Discharge to [facility] course by problem list: Acute Hypoxic Respiratory Failure:secondary to Necrotizing Pseudomonal PNA. Intubated on 10/3, extubated on 10/6. Doing well on 2L via nasal cannula-continue IV Abx. DNR reconfirmed with  patient and daughter. Please continue to transition/titrated off oxygen while at SNF  HCAP:Necrotizing PNA due to Pseudomonas. Tracheal Aspirate on 10/3 +ve for Pseudomonas. Maintained on Fortaz-transitioned to levofloxacin prior to discharge-last dose of antibiotic on 10/15. Spoke with Dr. Caren Griffins Snyder-infectious disease-who reviewed chart-recommended to transition to levofloxacin. Overall slowly Improving, and remains afebrile. Main issue at this point seems to be deconditioning  and debility.  Pseudomonas Bacteremia:Blood culture on 10/3 +ve for Pseudomonas-On Fortaz-repeat Blood culture on 10/5 neg. See above.  Septic Shock:secondary to HCAP/Pseudomonal Bacteremia. Sepsis Pathophysiology has resolved.Afebrile-Leukocytosis has resolved. Abx/cultures as above. Main issue now is deconditioning and generalized weakness.  Diarrhea: Apparently has a history of C. difficile- C. difficile PCR 2 negative. Diarrhea has resolved on its own. Suspect this was from [place] on Levaquin.  Acute Encephalopathy:due to sepsis. Resolved.  WUJ:[id number] azotemia-resolved. Please continue to follow electrolytes closely-suggest repeating chemistry panel in 1 week  Thrombocytopenia:secondary to sepsis-improving. Please follow CBC in 1 week  Anemia:secondary to critical illness-Hb stable-follow CBC in 1 week  Hypophosphotemia:resolved.   Hypokalemia: Repleted - follow chemistry panel in 1 week  Hx of Meningioma:s/p craniotomy and resection in 2014-getting XRT prior to this admit. Continue Decadron. Please ensure follow-up with her radiation oncologist at next scheduled appointment.  Hypothyroidism:continue Synthroid on discharge  Dysphagia/Odynophagia:Suspect dysphagia secondary to acute illness/debility-odynophagia ongoing prior to this admit-likely due to XRT/thrush-completed Diflucan.On Dysphagia 3 patient/family aware of aspiration risk. Please ensure follow-up by speech therapy at SNF  Steroid induced hyperglycemia:A1C 5.7-maintained on SSI while inpatient. CBGs stable.   HTN: Moderately controlled with metoprolol, resume amlodipine on discharge  GERD:PPI  Dyslipidemia:resume Statin on discharge  Deconditioning: Medical issues are stable and have plateaued-main issue now is deconditioning and generalized weakness. PT eval completed-recommending CIR-  CIR evaluation completed-felt to be more appropriate for SNF.   TODAY-DAY OF DISCHARGE:  Subjective:   Meghan Yu  today has no headache,no chest abdominal pain,no new weakness tingling or numbness. No further diarrhea-continues to be very weak and deconditioned  Objective:   Blood pressure 158/72, pulse 74, temperature 97.5 F (36.4 C), temperature source Axillary, resp. rate 18, height 4\' 11"  (1.499 m), weight 62.4 kg (137 lb 9.1 oz), SpO2 100 %. No intake or output data in the 24 hours ending 08/02/15 0936 Filed Weights   07/29/15 2000 08/01/15 0439 08/02/15 0500  Weight: 65.5 kg (144 lb 6.4 oz) 59.4 kg (130 lb 15.3 oz) 62.4 kg (137 lb 9.1 oz)    Exam Gen Exam: Awake and alert with clear speech. Appears chronically weak/deconditioned Neck: Supple,  Chest: B/L Clear anteriorly CVS: S1 S2 Regular Abdomen: soft, BS +, non tender, non distended.  Extremities: No edema, lower extremities warm to touch. Neurologic: Non Focal.  Skin: No Rash.  Wounds: N/A.   DISCHARGE CONDITION: Stable  DISPOSITION: SNF  DISCHARGE INSTRUCTIONS:    Activity:  As tolerated with Full fall precautions use walker/cane & assistance as needed  CODE STATUS: DO NOT RESUSCITATE  Get Medicines reviewed and adjusted: Please take all your medications with you for your next visit with your Primary MD  Please request your Primary MD to go over all hospital tests and procedure/radiological results at the follow up, please ask your Primary MD to get all Hospital records sent to his/her office.  If you experience worsening of your admission symptoms, develop shortness of breath, life threatening emergency, suicidal or homicidal thoughts you must seek medical attention immediately by calling 911 or calling your MD immediately  if symptoms less  severe.  You must read complete instructions/literature along with all the possible adverse reactions/side effects for all the Medicines you take and that have been prescribed to you. Take any new Medicines after you have completely understood and accpet all the possible adverse  reactions/side effects.   Do not drive when taking Pain medications.   Do not take more than prescribed Pain, Sleep and Anxiety Medications  Special Instructions: If you have smoked or chewed Tobacco  in the last 2 yrs please stop smoking, stop any regular Alcohol  and or any Recreational drug use.  Wear Seat belts while driving.  Please note  You were cared for by a hospitalist during your hospital stay. Once you are discharged, your primary care physician will handle any further medical issues. Please note that NO REFILLS for any discharge medications will be authorized once you are discharged, as it is imperative that you return to your primary care physician (or establish a relationship with a primary care physician if you do not have one) for your aftercare needs so that they can reassess your need for medications and monitor your lab values.   Diet recommendation: Dysphagia 3 diet with nectar thick liquids Aspiration precautions:yes  Discharge Instructions    Call MD for:  extreme fatigue    Complete by:  As directed      Call MD for:  persistant dizziness or light-headedness    Complete by:  As directed      Call MD for:  persistant nausea and vomiting    Complete by:  As directed      Diet - low sodium heart healthy    Complete by:  As directed   Dysphagia 3 diet with nectar thick liquids     Increase activity slowly    Complete by:  As directed            Follow-up Information    Follow up with Wenda Low, MD. Schedule an appointment as soon as possible for a visit in 2 weeks.   Specialty:  Internal Medicine   Contact information:   [address] [place] [address] [phone]       Follow up with Eppie Gibson, MD. Schedule an appointment as soon as possible for a visit in 2 weeks.   Specialty:  Radiation Oncology   Contact information:   [address] [address] [phone]      Total Time spent on discharge equals 45  minutes.  SignedOren Binet 08/02/2015 9:36 AM

## 2015-08-02 NOTE — Progress Notes (Signed)
Speech Language Pathology Treatment: Dysphagia  Patient Details Name: Meghan Yu MRN: [id number] DOB: Jun 01, 1932 Today's Date: 08/02/2015 Time: [id number] SLP Time Calculation (min) (ACUTE ONLY): 15 min  Assessment / Plan / Recommendation Clinical Impression  Skilled treatment session focused on addressing dysphagia goals.  SLP facilitated session with trials of ice chips, which resulted in immediate cough following 100% of trials (limited to 3 boluses before patient declined).  SLP also provided skilled observation of nectar-thick liquid consumption via straw with delayed cough x1 due to large consecutive straw sips.  Min verbal cues for portion control and pacing effective at preventing further overt s/s of aspiration with nectar-thick liquids via straw. Daughter and grandchildren present for session and SLP educated on recommendations for continuation of current diet orders pending improved glottic closure/strenght of voice and cough.  Continue with current plan of care.      HPI Other Pertinent Information: 81yo female presented with cough, dyspnea, weakness, incontinence from PNA, sepsis. Radiation for meningioma. MD reprots necrotizing pneumonia. Family reports dysphagia during radiation treatment due to severe thrush.  Pt intubated from 10/3 to 10/6. Now DNR/DNI.    Pertinent Vitals Pain Assessment: No/denies pain Faces Pain Scale: Hurts little more Pain Location: lower back with certain movement Pain Descriptors / Indicators: Aching Pain Intervention(s): Monitored during session;Repositioned  SLP Plan  Continue with current plan of care    Recommendations Diet recommendations: Dysphagia 3 (mechanical soft);Nectar-thick liquid Liquids provided via: Cup;Straw Medication Administration: Crushed with puree Supervision: Patient able to self feed;Full supervision/cueing for compensatory strategies Compensations: Slow rate;Small sips/bites Postural Changes and/or Swallow Maneuvers: Seated  upright 90 degrees              Oral Care Recommendations: Oral care BID Follow up Recommendations: 24 hour supervision/assistance;Skilled Nursing facility Plan: Continue with current plan of care    GO    Carmelia Roller., CCC-SLP [phone]  [place] 08/02/2015, 11:38 AM

## 2015-08-02 NOTE — Care Management Note (Signed)
Case Management Note  Patient Details  Name: Meghan Yu MRN: [id number] Date of Birth: 1932-05-23  Subjective/Objective:                    Action/Plan:Will be signed off as Patient will be discharged to SNF today. Will be available for any unexpected CM needs.    Expected Discharge Date:                  Expected Discharge Plan:  Skilled Nursing Facility  In-House Referral:  Clinical Social Work  Discharge planning Services  CM Consult  Post Acute Care Choice:    Choice offered to:     DME Arranged:    DME Agency:     HH Arranged:    [place] Agency:     Status of Service:  Completed, signed off  Medicare Important Message Given:  Yes-second notification given Date Medicare IM Given:    Medicare IM give by:    Date Additional Medicare IM Given:    Additional Medicare Important Message give by:     If discussed at [place] of Stay Meetings, dates discussed:    Additional Comments:  Delrae Sawyers, RN 08/02/2015, 10:26 AM

## 2015-08-03 ENCOUNTER — Non-Acute Institutional Stay (SKILLED_NURSING_FACILITY): Payer: Medicare Other | Admitting: Internal Medicine

## 2015-08-03 ENCOUNTER — Encounter: Payer: Self-pay | Admitting: Internal Medicine

## 2015-08-03 DIAGNOSIS — K219 Gastro-esophageal reflux disease without esophagitis: Secondary | ICD-10-CM | POA: Diagnosis not present

## 2015-08-03 DIAGNOSIS — E46 Unspecified protein-calorie malnutrition: Secondary | ICD-10-CM

## 2015-08-03 DIAGNOSIS — R1310 Dysphagia, unspecified: Secondary | ICD-10-CM | POA: Diagnosis not present

## 2015-08-03 DIAGNOSIS — J189 Pneumonia, unspecified organism: Secondary | ICD-10-CM | POA: Diagnosis not present

## 2015-08-03 DIAGNOSIS — D420 Neoplasm of uncertain behavior of cerebral meninges: Secondary | ICD-10-CM

## 2015-08-03 DIAGNOSIS — R5381 Other malaise: Secondary | ICD-10-CM | POA: Diagnosis not present

## 2015-08-03 DIAGNOSIS — E039 Hypothyroidism, unspecified: Secondary | ICD-10-CM | POA: Diagnosis not present

## 2015-08-03 DIAGNOSIS — N179 Acute kidney failure, unspecified: Secondary | ICD-10-CM

## 2015-08-03 DIAGNOSIS — I10 Essential (primary) hypertension: Secondary | ICD-10-CM

## 2015-08-03 DIAGNOSIS — D649 Anemia, unspecified: Secondary | ICD-10-CM | POA: Diagnosis not present

## 2015-08-03 DIAGNOSIS — D696 Thrombocytopenia, unspecified: Secondary | ICD-10-CM | POA: Diagnosis not present

## 2015-08-03 NOTE — Progress Notes (Signed)
Patient ID: Meghan Yu, female   DOB: [birth year], 81 y.o.   MRN: [id number]     [place]  PCP: Wenda Low, MD  Code Status: DNR  No Known Allergies  Chief Complaint  Patient presents with  . New Admit To SNF    New admission      HPI:  81 y.o. patient is here for short term rehabilitation post hospital admission from 07/24/15-08/02/15 with sepsis in setting of HCAP from pseudomonas pneumoniae. She had acute hypoxic respiratory failure and required intubation. She also had acute renal failure. She is seen in her room today. She has PMH of meningioma s/p craniotomy 2014 and currently on XRT prior to hospital admission. She feels weak and tired. Her appetite is poor. Denies any concerns this visit. o2 has been helping with her breathing.   Review of Systems:  Constitutional: Negative for fever, chills, diaphoresis.  HENT: Negative for headache, congestion, nasal discharge   Eyes: Negative for eye pain, blurred vision, double vision and discharge.  Respiratory: Negative for wheezing.  positive for cough and dyspnea with exertion Cardiovascular: Negative for chest pain, palpitations, leg swelling.  Gastrointestinal: Negative for heartburn, nausea, vomiting, abdominal pain Genitourinary: Negative for dysuria, flank pain.  Musculoskeletal: Negative for back pain, falls Skin: Negative for itching, rash.  Neurological: Negative for dizziness, tingling, focal weakness Psychiatric/Behavioral: Negative for depression   Past Medical History  Diagnosis Date  . Hyperlipidemia   . Anxiety disorder   . Arthritis of knee, right     Needs replacement/Dr. Sharol Given  . Colon polyps     colonoscopy by Dr. Wynetta Emery  . Brain cancer ([place]) 03/30/13    Left frontal meningioma  . Hypothyroidism     hypothyroidism  . UTI (lower urinary tract infection) 2014    HX e. coli  . Degenerative arthritis of hand     bilateral  . Sepsis ([place])   . SVT (supraventricular tachycardia)  ([place]) 2014   Past Surgical History  Procedure Laterality Date  . Knee arthroscopy    . Cholecystectomy    . Abdominal hysterectomy    . Craniotomy Left 03/30/2013    Procedure: LEFT FRONTAL CRANIOTOMY FOR RESECTION OF MENINGIOMA;  Surgeon: Kristeen Miss, MD;  Location: [facility];  Service: Neurosurgery;  Laterality: Left;  Left Frontal Craniotomy for tumor excision   Social History:   reports that she has never smoked. She does not have any smokeless tobacco history on file. She reports that she does not drink alcohol or use illicit drugs.  Family History  Problem Relation Age of Onset  . Stroke Mother   . Cirrhosis Father   . Emphysema Sister   . Rectal cancer Sister     Medications:   Medication List       This list is accurate as of: 08/03/15 10:02 AM.  Always use your most recent med list.               ALPRAZolam 1 MG tablet  Commonly known as:  XANAX  Take 1 tablet (1 mg total) by mouth 2 (two) times daily as needed for sleep or anxiety.     amLODipine 5 MG tablet  Commonly known as:  NORVASC  Take 5 mg by mouth daily.     calcium carbonate 600 MG Tabs tablet  Commonly known as:  OS-CAL  Take 600 mg by mouth 2 (two) times daily with a meal.     cetirizine 10 MG tablet  Commonly known  as:  ZYRTEC  Take 10 mg by mouth daily.     citalopram 10 MG tablet  Commonly known as:  CELEXA  Take 10 mg by mouth daily.     dexamethasone 4 MG tablet  Commonly known as:  DECADRON  Take 1 tablet (4 mg total) by mouth 3 (three) times daily.     feeding supplement (NEPRO CARB STEADY) Liqd  Take 237 mLs by mouth 3 (three) times daily between meals.     Fish Oil 1000 MG Caps  Take 1-2 capsules (1,000-2,000 mg total) by mouth 2 (two) times daily. Takes 2000 mg in the morning and 1000 mg in the evening     ipratropium-albuterol 0.5-2.5 (3) MG/3ML Soln  Commonly known as:  DUONEB  Take 3 mLs by nebulization every 6 (six) hours.     levofloxacin 750 MG tablet    Commonly known as:  LEVAQUIN  Take 1 tablet (750 mg total) by mouth every other day. Last dose on 10/15 and then discontinue     levothyroxine 75 MCG tablet  Commonly known as:  SYNTHROID, LEVOTHROID  Take 1 tablet (75 mcg total) by mouth daily before breakfast.     lidocaine 2 % solution  Commonly known as:  XYLOCAINE  Patient: Mix 1part 2% viscous lidocaine, 1part H20. Swish and/or swallow 48mL of this mixture, 7min before meals and at bedtime, up to QID     menthol-cetylpyridinium 3 MG lozenge  Commonly known as:  CEPACOL  Take 1 lozenge (3 mg total) by mouth as needed for sore throat.     metoprolol 50 MG tablet  Commonly known as:  LOPRESSOR  Take 1 tablet (50 mg total) by mouth every 12 (twelve) hours.     multivitamin with minerals Tabs tablet  Take 1 tablet by mouth daily.     omeprazole 40 MG capsule  Commonly known as:  PRILOSEC  Take 40 mg by mouth daily.     prochlorperazine 10 MG tablet  Commonly known as:  COMPAZINE  Take 10 mg by mouth every 6 (six) hours as needed for nausea or vomiting.     senna-docusate 8.6-50 MG tablet  Commonly known as:  Senokot-S  Take 1 tablet by mouth at bedtime.     simvastatin 20 MG tablet  Commonly known as:  ZOCOR  Take 1 tablet (20 mg total) by mouth every evening.     Vitamin D 2000 UNITS Caps  Take 2,000 Units by mouth daily.         Physical Exam: Filed Vitals:   08/03/15 0950  BP: 137/77  Pulse: 82  Temp: 97 F (36.1 C)  TempSrc: Oral  Resp: 18  SpO2: 93%    General- elderly female, frail and thin built, in no acute distress Head- normocephalic, atraumatic Nose- normal nasal mucosa, no maxillary or frontal sinus tenderness, no nasal discharge Throat- moist mucus membrane Eyes- PERRLA, EOMI, no pallor, no icterus, no discharge, normal conjunctiva, normal sclera Neck- no cervical lymphadenopathy Cardiovascular- normal s1,s2, no murmurs, palpable dorsalis pedis and radial pulses, no leg  edema Respiratory- bilateral poor air entry, on o2 by [place], no wheeze, no rhonchi, no crackles, no use of accessory muscles Abdomen- bowel sounds present, soft, non tender Musculoskeletal- able to move all 4 extremities, generalized weakness Neurological- no focal deficit, alert and oriented to person, place and time Psychiatry- normal mood and affect    Labs reviewed: Basic Metabolic Panel:  Recent Labs  07/27/15 2200  07/29/15 0502 07/30/15 3295  07/31/15 0540  NA  --   < > 135 136 134*  K  --   < > 3.9 3.1* 4.2  CL  --   < > 96* 89* 96*  CO2  --   < > 29 34* 29  GLUCOSE  --   < > 192* 153* 176*  BUN  --   < > 22* 22* 24*  CREATININE  --   < > 0.56 0.57 0.57  CALCIUM  --   < > 8.5* 8.6* 8.5*  MG 1.9  < > 2.0 1.6* 2.1  PHOS 3.1  --  3.0 3.3  --   < > = values in this interval not displayed. Liver Function Tests:  Recent Labs  07/24/15 1331 07/30/15 0651  AST 32 17  ALT 20 20  ALKPHOS 71 75  BILITOT 0.9 0.6  PROT 5.5* 5.5*  ALBUMIN 2.9* 2.1*   No results for input(s): LIPASE, AMYLASE in the last 8760 hours. No results for input(s): AMMONIA in the last 8760 hours. CBC:  Recent Labs  07/24/15 1331  07/28/15 0247 07/29/15 0502 07/30/15 0651  WBC 28.0*  < > 8.1 8.0 10.0  NEUTROABS 26.1*  --   --  7.3  --   HGB 15.4*  < > 10.3* 10.5* 11.4*  HCT 45.3  < > 31.0* 31.7* 33.9*  MCV 84.2  < > 85.9 86.1 85.2  PLT 131*  < > 106* 114* 146*  < > = values in this interval not displayed. Cardiac Enzymes:  Recent Labs  07/24/15 1554  TROPONINI 0.03   BNP: Invalid input(s): POCBNP CBG:  Recent Labs  08/01/15 1942 08/01/15 2357 08/02/15 0349  GLUCAP 179* 303* 142*    Radiological Exams: Dg Chest Port 1 View  07/27/2015  CLINICAL DATA:  Volume excess EXAM: PORTABLE CHEST 1 VIEW COMPARISON:  07/26/2015 FINDINGS: Endotracheal tube, left IJ central line, and nasogastric tube are in place as before. Heart contours are obscured. Bibasilar opacities appear stable.  Bilateral pleural effusions and bilateral lower lobe opacities are present, left greater than right. IMPRESSION: Bilateral pleural effusions and bibasilar opacity, left greater than right. Electronically Signed   By: Nolon Nations M.D.   On: 07/27/2015 13:36   Dg Chest Port 1 View  07/26/2015  CLINICAL DATA:  Pneumonia.  Shortness of breath. EXAM: PORTABLE CHEST 1 VIEW COMPARISON:  07/25/2015. FINDINGS: Endotracheal tube, NG tube, left IJ line stable position. Heart size normal. Persistent left lower lobe infiltrate left pleural effusion. No pneumothorax. IMPRESSION: 1. Lines and tubes in stable position. 2. Persistent left lower lobe infiltrate and left pleural effusion. Electronically Signed   By: Marcello Moores  Register   On: 07/26/2015 07:11    Assessment/Plan  Physical deconditioning With recent pneumonia and acute respiratory failure. Will have her work with physical therapy and occupational therapy team to help with gait training and muscle strengthening exercises.fall precautions. Skin care. Encourage to be out of bed. Poor prognois with her medical co-morbidities. If no improvement noted with/ during therapy, consider palliative care consult  HCAP With acute respiratory failure. Continue and complete course of levaquin 750 mg qod on 08/05/15. Continue prn duoneb. To provide incentive spirometer for pulmonary toilet. Wean off o2 as tolerated  Acute renal failure Monitor bmp, avoid nephrotoxic agent  Protein calorie malnutrition Continue feeding supplement, monitor weight, dietary consult  Meningioma S/p craniotomy 2014 and XRT until prior to admission to hospital. Continue decadron 4 mg tid for now  Dysphagia Completed course of diflucan,  continue dysphagia diet with aspiration precautions, to follow with SLP team. Continue lidocaine swish and swallow for now  Anemia With recent acute illness. Monitor cbc  Thrombocytopenia likley from recent sepsis. Monitor cbc, no bleed at  present  HTN Stable bp, continue norvasc 5 mg daily, metoprolol 50 mg bid  Hypothyroidism continue Synthroid 75 mcg daily  gerd Continue prilosec 40 mg daily    Goals of care: short term rehabilitation   Labs/tests ordered: cbc with diff, cmp 08/07/15  Family/ staff Communication: reviewed care plan with patient and nursing supervisor    Blanchie Serve, MD  [place] [phone] (Monday-Friday 8 am - 5 pm) [phone] (afterhours)

## 2015-08-14 ENCOUNTER — Other Ambulatory Visit: Payer: Self-pay | Admitting: *Deleted

## 2015-08-14 ENCOUNTER — Encounter: Payer: Self-pay | Admitting: Nurse Practitioner

## 2015-08-14 ENCOUNTER — Non-Acute Institutional Stay (SKILLED_NURSING_FACILITY): Payer: Medicare Other | Admitting: Nurse Practitioner

## 2015-08-14 DIAGNOSIS — J9601 Acute respiratory failure with hypoxia: Secondary | ICD-10-CM

## 2015-08-14 DIAGNOSIS — R627 Adult failure to thrive: Secondary | ICD-10-CM | POA: Diagnosis not present

## 2015-08-14 DIAGNOSIS — D420 Neoplasm of uncertain behavior of cerebral meninges: Secondary | ICD-10-CM

## 2015-08-14 DIAGNOSIS — F411 Generalized anxiety disorder: Secondary | ICD-10-CM

## 2015-08-14 MED ORDER — HYDROCODONE-ACETAMINOPHEN 5-325 MG PO TABS: ORAL_TABLET | ORAL | Status: DC

## 2015-08-14 NOTE — Telephone Encounter (Signed)
[facility] 

## 2015-08-14 NOTE — Progress Notes (Signed)
Nursing Home Location:  [place] of Service: SNF (85)  PCP: Wenda Low, MD  No Known Allergies  Chief Complaint  Patient presents with  . Acute Visit    HPI:  Patient is a 80 y.o. female seen today at [facility] at the request of nursing. Pt with a pmh of meningioma s/p craniotomy 2014 on XRT prior to hospital admission. Pt recently hospitalized sepsis in setting of HCAP from pseudomonas pneumoniae and acute hypoxic respiratory failure and required intubation. Pt had a decline over the weekend and has become more short of breath, tachycardic, and drop in O2 this morning. Family and pt do not wish for pt to go back to the hospital but to be made comfortable. Pt currently DNR and now wants to transition strictly to comfort care. Pt has been complaining of right sided chest pains. Chest xray obtained without acute findings. pts daughter and granddaughter at bedside.   Review of Systems:  Review of Systems  Unable to perform ROS: Mental status change    Past Medical History  Diagnosis Date  . Hyperlipidemia   . Anxiety disorder   . Arthritis of knee, right     Needs replacement/Dr. Sharol Given  . Colon polyps     colonoscopy by Dr. Wynetta Emery  . Brain cancer ([place]) 03/30/13    Left frontal meningioma  . Hypothyroidism     hypothyroidism  . UTI (lower urinary tract infection) 2014    HX e. coli  . Degenerative arthritis of hand     bilateral  . Sepsis ([place])   . SVT (supraventricular tachycardia) ([place]) 2014   Past Surgical History  Procedure Laterality Date  . Knee arthroscopy    . Cholecystectomy    . Abdominal hysterectomy    . Craniotomy Left 03/30/2013    Procedure: LEFT FRONTAL CRANIOTOMY FOR RESECTION OF MENINGIOMA;  Surgeon: Kristeen Miss, MD;  Location: [facility];  Service: Neurosurgery;  Laterality: Left;  Left Frontal Craniotomy for tumor excision   Social History:   reports that she has never smoked. She does not have  any smokeless tobacco history on file. She reports that she does not drink alcohol or use illicit drugs.  Family History  Problem Relation Age of Onset  . Stroke Mother   . Cirrhosis Father   . Emphysema Sister   . Rectal cancer Sister     Medications: Patient's Medications  New Prescriptions   HYDROCODONE-ACETAMINOPHEN (NORCO/VICODIN) 5-325 MG TABLET    Take one to two tablets by mouth every 6 hours as needed for pain. Do not exceed 4gm of Tylenol in 24 hours  Previous Medications   ALPRAZOLAM (XANAX) 1 MG TABLET    Take 1 tablet (1 mg total) by mouth 2 (two) times daily as needed for sleep or anxiety.   AMLODIPINE (NORVASC) 5 MG TABLET    Take 5 mg by mouth daily.   CALCIUM CARBONATE (OS-CAL) 600 MG TABS    Take 600 mg by mouth 2 (two) times daily with a meal.   CETIRIZINE (ZYRTEC) 10 MG TABLET    Take 10 mg by mouth daily.   CHOLECALCIFEROL (VITAMIN D) 2000 UNITS CAPS    Take 2,000 Units by mouth daily.    CITALOPRAM (CELEXA) 10 MG TABLET    Take 10 mg by mouth daily.    DEXAMETHASONE (DECADRON) 4 MG TABLET    Take 1 tablet (4 mg total) by mouth 3 (three) times daily.  IPRATROPIUM-ALBUTEROL (DUONEB) 0.5-2.5 (3) MG/3ML SOLN    Take 3 mLs by nebulization every 6 (six) hours.   LEVOFLOXACIN (LEVAQUIN) 750 MG TABLET    Take 1 tablet (750 mg total) by mouth every other day. Last dose on 10/15 and then discontinue   LEVOTHYROXINE (SYNTHROID, LEVOTHROID) 75 MCG TABLET    Take 1 tablet (75 mcg total) by mouth daily before breakfast.   LIDOCAINE (XYLOCAINE) 2 % SOLUTION    Patient: Mix 1part 2% viscous lidocaine, 1part H20. Swish and/or swallow 40mL of this mixture, 58min before meals and at bedtime, up to QID   MENTHOL-CETYLPYRIDINIUM (CEPACOL) 3 MG LOZENGE    Take 1 lozenge (3 mg total) by mouth as needed for sore throat.   METOPROLOL (LOPRESSOR) 50 MG TABLET    Take 1 tablet (50 mg total) by mouth every 12 (twelve) hours.   MULTIPLE VITAMIN (MULTIVITAMIN WITH MINERALS) TABS    Take 1 tablet  by mouth daily.   NUTRITIONAL SUPPLEMENTS (FEEDING SUPPLEMENT, NEPRO CARB STEADY,) LIQD    Take 237 mLs by mouth 3 (three) times daily between meals.   OMEGA-3 FATTY ACIDS (FISH OIL) 1000 MG CAPS    Take 1-2 capsules (1,000-2,000 mg total) by mouth 2 (two) times daily. Takes 2000 mg in the morning and 1000 mg in the evening   OMEPRAZOLE (PRILOSEC) 40 MG CAPSULE    Take 40 mg by mouth daily.    PROCHLORPERAZINE (COMPAZINE) 10 MG TABLET    Take 10 mg by mouth every 6 (six) hours as needed for nausea or vomiting.   SENNA-DOCUSATE (SENOKOT-S) 8.6-50 MG PER TABLET    Take 1 tablet by mouth at bedtime.   SIMVASTATIN (ZOCOR) 20 MG TABLET    Take 1 tablet (20 mg total) by mouth every evening.  Modified Medications   No medications on file  Discontinued Medications   No medications on file     Physical Exam: Filed Vitals:   08/14/15 1057  BP: 183/97  Pulse: 164  Temp: 98.8 F (37.1 C)  Resp: 40  SpO2: 88%    Physical Exam  Constitutional: She appears lethargic.  Frail elderly female, with increase RR  Eyes: Pupils are equal, round, and reactive to light.  Neck: Neck supple.  Cardiovascular: Regular rhythm.  Tachycardia present.   Pulmonary/Chest: No accessory muscle usage. She has decreased breath sounds (throughout).  Abdominal: Soft. Bowel sounds are normal. She exhibits no distension.  Musculoskeletal: She exhibits no edema or tenderness.  Neurological: She appears lethargic.  Skin: There is pallor.    Labs reviewed: Basic Metabolic Panel:  Recent Labs  07/27/15 2200  07/29/15 0502 07/30/15 0651 07/31/15 0540  NA  --   < > 135 136 134*  K  --   < > 3.9 3.1* 4.2  CL  --   < > 96* 89* 96*  CO2  --   < > 29 34* 29  GLUCOSE  --   < > 192* 153* 176*  BUN  --   < > 22* 22* 24*  CREATININE  --   < > 0.56 0.57 0.57  CALCIUM  --   < > 8.5* 8.6* 8.5*  MG 1.9  < > 2.0 1.6* 2.1  PHOS 3.1  --  3.0 3.3  --   < > = values in this interval not displayed. Liver Function  Tests:  Recent Labs  07/24/15 1331 07/30/15 0651  AST 32 17  ALT 20 20  ALKPHOS 71 75  BILITOT 0.9  0.6  PROT 5.5* 5.5*  ALBUMIN 2.9* 2.1*   No results for input(s): LIPASE, AMYLASE in the last 8760 hours. No results for input(s): AMMONIA in the last 8760 hours. CBC:  Recent Labs  07/24/15 1331  07/28/15 0247 07/29/15 0502 07/30/15 0651  WBC 28.0*  < > 8.1 8.0 10.0  NEUTROABS 26.1*  --   --  7.3  --   HGB 15.4*  < > 10.3* 10.5* 11.4*  HCT 45.3  < > 31.0* 31.7* 33.9*  MCV 84.2  < > 85.9 86.1 85.2  PLT 131*  < > 106* 114* 146*  < > = values in this interval not displayed. TSH: No results for input(s): TSH in the last 8760 hours. A1C: Lab Results  Component Value Date   HGBA1C 5.7* 04/05/2013   Lipid Panel: No results for input(s): CHOL, HDL, LDLCALC, TRIG, CHOLHDL, LDLDIRECT in the last 8760 hours.  Radiological Exams 08/13/15   Chest X-Ray Chest single view was obtained Findings: the lungs are clear. There is no evidence of atelectasis, pneumothorax, pneumonia,consolidation, congestion or pleural effusion. The cardiac silhouette is not enlarged.  The trachea is midline. Impression: No radiographic evidence of acute pulmonary disease.  Assessment/Plan Failure to thrive Pt with worsening decline over the weekend, family wishes for comfort only. Will give Roxanol 5 mg q 4 hours for pain/shortness of breath -may repeat dose after 30 mins if pt still with increased RR -will consult hospice at this time.   Acute respiratory failure with hypoxia  With current drop in O2, to use O2 as needed for comfort and Roxanol.  -has as needed duonebs  Anxiety Will start xanax 1 mg q6h prn anxiety   Meningioma S/p craniotomy 2014  Pt was under going radiation therapy until prior to recent admission to hospital.    Carlos American. Harle Battiest  [facility] [phone] 8 am - 5 pm) [phone] (after hours)

## 2015-08-15 ENCOUNTER — Non-Acute Institutional Stay (SKILLED_NURSING_FACILITY): Payer: Medicare Other | Admitting: Internal Medicine

## 2015-08-15 DIAGNOSIS — E039 Hypothyroidism, unspecified: Secondary | ICD-10-CM | POA: Diagnosis not present

## 2015-08-15 DIAGNOSIS — R627 Adult failure to thrive: Secondary | ICD-10-CM

## 2015-08-15 DIAGNOSIS — I10 Essential (primary) hypertension: Secondary | ICD-10-CM | POA: Diagnosis not present

## 2015-08-15 DIAGNOSIS — E46 Unspecified protein-calorie malnutrition: Secondary | ICD-10-CM

## 2015-08-15 DIAGNOSIS — D420 Neoplasm of uncertain behavior of cerebral meninges: Secondary | ICD-10-CM | POA: Diagnosis not present

## 2015-08-15 DIAGNOSIS — J9601 Acute respiratory failure with hypoxia: Secondary | ICD-10-CM

## 2015-08-15 DIAGNOSIS — F4323 Adjustment disorder with mixed anxiety and depressed mood: Secondary | ICD-10-CM

## 2015-08-15 DIAGNOSIS — R1310 Dysphagia, unspecified: Secondary | ICD-10-CM

## 2015-08-15 NOTE — Progress Notes (Signed)
Patient ID: Meghan Yu, female   DOB: 1932-02-20, 81 y.o.   MRN: [id number]     Facility: [facility]   Chief complaint- discharge visit  Allergies reviewed- NKDA  HPI:  81 y.o. patient is seen for discharge visit. She had a rapid decline over the weekend and is now under hospice service. She was seen by hospice nurse today and will be discharged home with hospice service 07/29/2015 in am. She was here for short term rehabilitation post hospital admission from 07/24/15-08/02/15 with sepsis in setting of HCAP from pseudomonas pneumoniae and acute hypoxic respiratory failure and required intubation. She has PMH of meningioma s/p craniotomy 2014 on XRT prior to hospital admission and now on dexamethasone. She is seen in her room with her son and daughter in law at bedside. She appears comfortable with o2 by nasal canula. She is not participating in HPI or ROS. As per son has not been eating for past few days and is sleeping for most part of the day.   Review of Systems:  Unable to obtain  Past Medical History  Diagnosis Date  . Hyperlipidemia   . Anxiety disorder   . Arthritis of knee, right     Needs replacement/Dr. Sharol Given  . Colon polyps     colonoscopy by Dr. Wynetta Emery  . Brain cancer ([place]) 03/30/13    Left frontal meningioma  . Hypothyroidism     hypothyroidism  . UTI (lower urinary tract infection) 2014    HX e. coli  . Degenerative arthritis of hand     bilateral  . Sepsis ([place])   . SVT (supraventricular tachycardia) ([place]) 2014    Medication reviewed. See [facility]   Medication List       This list is accurate as of: 08/15/15  4:51 PM.  Always use your most recent med list.               ALPRAZolam 1 MG tablet  Commonly known as:  XANAX  Take 1 tablet (1 mg total) by mouth 2 (two) times daily as needed for sleep or anxiety.     dexamethasone 4 MG tablet  Commonly known as:  DECADRON  Take 1 tablet (4 mg total) by mouth 3 (three) times daily.     feeding supplement (NEPRO CARB STEADY) Liqd  Take 237 mLs by mouth 3 (three) times daily between meals.     ipratropium-albuterol 0.5-2.5 (3) MG/3ML Soln  Commonly known as:  DUONEB  Take 3 mLs by nebulization every 6 (six) hours.     levothyroxine 75 MCG tablet  Commonly known as:  SYNTHROID, LEVOTHROID  Take 1 tablet (75 mcg total) by mouth daily before breakfast.     lidocaine 2 % solution  Commonly known as:  XYLOCAINE  Patient: Mix 1part 2% viscous lidocaine, 1part H20. Swish and/or swallow 30mL of this mixture, 10min before meals and at bedtime, up to QID     metoprolol 50 MG tablet  Commonly known as:  LOPRESSOR  Take 1 tablet (50 mg total) by mouth every 12 (twelve) hours.     morphine 20 MG/ML concentrated solution  Commonly known as:  ROXANOL  Take 5 mg by mouth every 4 (four) hours as needed for severe pain.     prochlorperazine 10 MG tablet  Commonly known as:  COMPAZINE  Take 10 mg by mouth every 6 (six) hours as needed for nausea or vomiting.         Physical exam Afebrile, RR  21/min, HR 88/min  General- elderly female, frail and thin built, in no acute distress Head- normocephalic, atraumatic Nose- normal nasal mucosa, no maxillary or frontal sinus tenderness, no nasal discharge Throat- moist mucus membrane Eyes- normal conjunctiva, normal sclera Neck- no cervical lymphadenopathy Cardiovascular- normal s1,s2, no murmurs Respiratory- bilateral poor air entry, on o2 by [place], no wheeze, no rhonchi, no crackles, no use of accessory muscles Abdomen- bowel sounds present, soft, non tender   Labs reviewed  Assessment/plan  Failure to thrive With her Physical deconditioning, recent pneumonia and acute respiratory failure. Poor prognois with her medical co-morbidities. Patient to be discharged home with hospice services with comfort care being the main goal of care.  Acute respiratory failure Continue oxygen by nasal canula @ 3l/min.  To provide roxanol 0.25  ml/5 mg q4h prn dyspnea. Completed levaquin for HCAP. Continue prn duoneb  Protein calorie malnutrition Continue feeding supplement and assistance with feed as tolerated  Anxiety Continue xanax 1 mg q6h prn anxiety  Meningioma S/p craniotomy 2014 and XRT until prior to admission to hospital. Continue decadron 4 mg tid for now  odynophagia Continue oral care at home as tolerated. Continue lidocaine swish and swallow for now  HTN Stable bp, cd/c norvasc and continue metoprolol 50 mg bid  Hypothyroidism continue Synthroid 75 mcg daily  Discontinue her zyrtec, vitamin d, oscal, amlodipine,senokot s, multivitamin, cepacol, norco, celexa for now with limited benefit from these medication at this point of time and with comfort care being the main goal of care  Home health services: hospice services  DME required: o2 at 3l/min by nasal canula  30 days supply of prescription medications provided  Plan of care discussed with patient's son and daughter and verbalizes understanding this. Reviewed care plan with nursing staff

## 2015-08-22 DEATH — deceased

## 2015-08-25 ENCOUNTER — Ambulatory Visit: Admit: 2015-08-25 | Payer: Medicare Other | Admitting: Radiation Oncology

## 2015-08-25 ENCOUNTER — Ambulatory Visit
Admission: RE | Admit: 2015-08-25 | Discharge: 2015-08-25 | Disposition: A | Discharge status: Home / Self Care | Payer: Medicare Other | Source: Ambulatory Visit | Attending: Radiation Oncology | Admitting: Radiation Oncology

## 2015-11-28 IMAGING — CR DG CHEST 1V PORT
1 series · 1 of 1 positions shown · non-contrast
Comparison: Portable exam 6956 hours compared to 8085 hours

CLINICAL DATA: Brain tumor receiving treatment, weakness at home,
near syncopal episode

EXAM:
PORTABLE CHEST 1 VIEW

[portable]
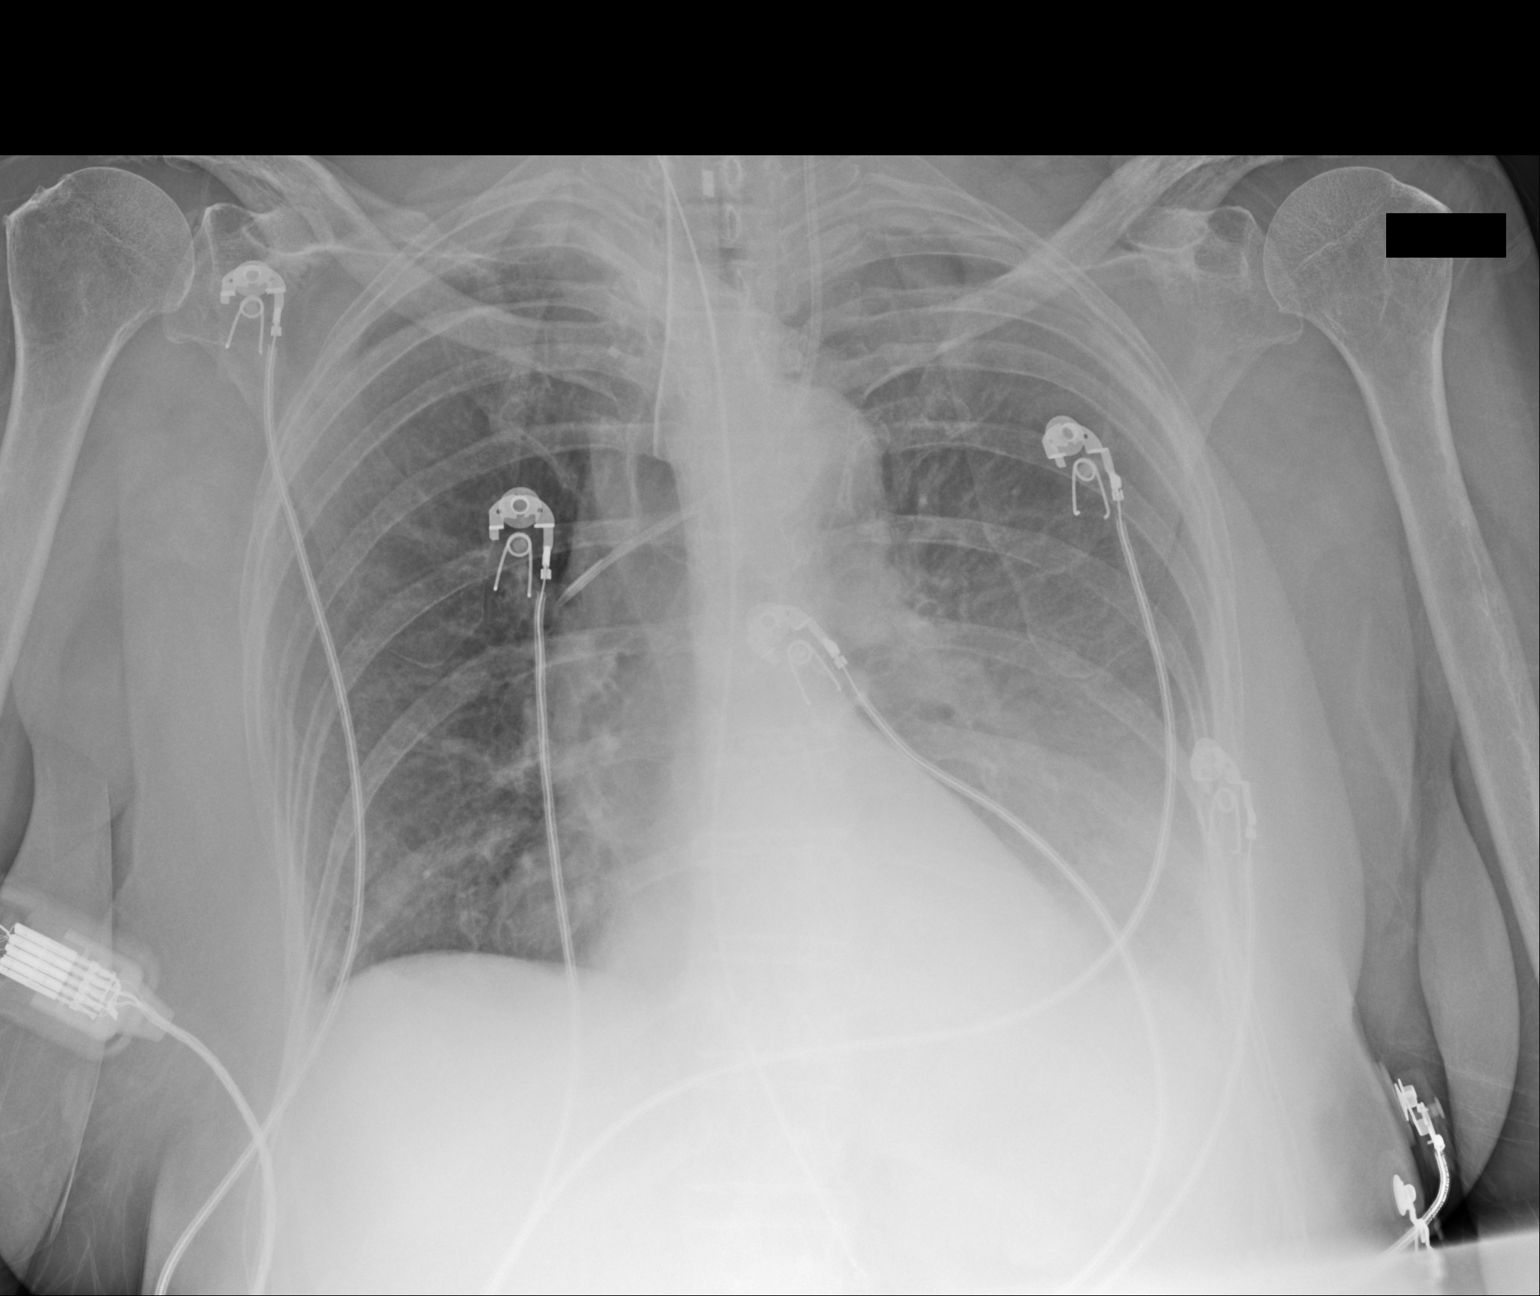

[1 of 1 positions shown; findings below may reference images not displayed]

FINDINGS: Tip of endotracheal tube projects 3.1 cm above carina.

Nasogastric tube extends into stomach.

LEFT jugular central venous catheter tip projects over SVC.

Slight rotation to the RIGHT.

Normal heart size mediastinal contours.

Atelectasis versus consolidation in LEFT lower lobe with associated
LEFT basilar effusion.

Remaining lungs clear.

No pneumothorax.
IMPRESSION: Line and tube positions as above.

LEFT pleural effusion with atelectasis versus consolidation in LEFT
lower lobe.

## 2015-11-29 IMAGING — CT CT HEAD W/O CM
1 of 2 series · 13 of 30 positions shown, 17 images · non-contrast
Comparison: CT scan of July 14, 2015.

CLINICAL DATA: Encephalopathy.

EXAM:
CT HEAD WITHOUT CONTRAST
TECHNIQUE: Contiguous axial images were obtained from the base of the skull
through the vertex without intravenous contrast.

[Series 1: — · axial · 0.49mm/px · z∈[-336,-206]mm · 13 of 32 slices shown, 17 images]
[im 3/32  brain]
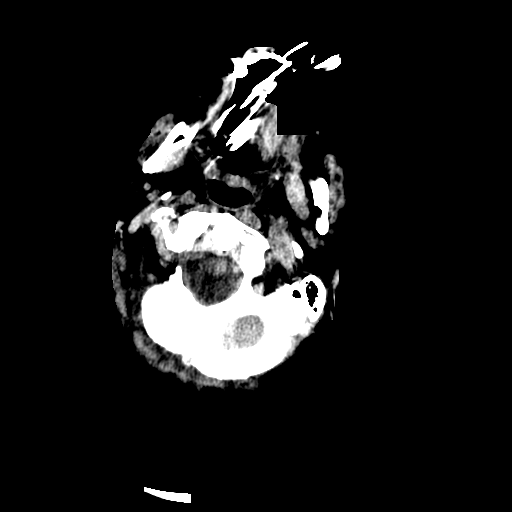
[im 3/32  bone]
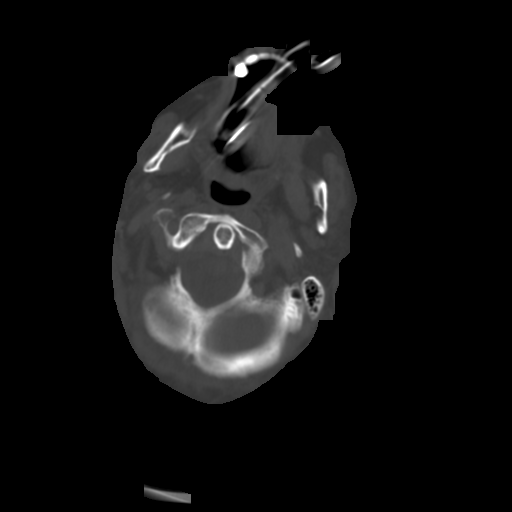
[im 5/32  brain]
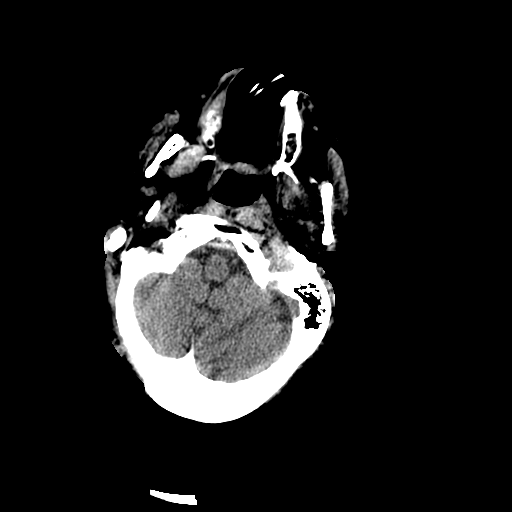
[im 7/32  brain]
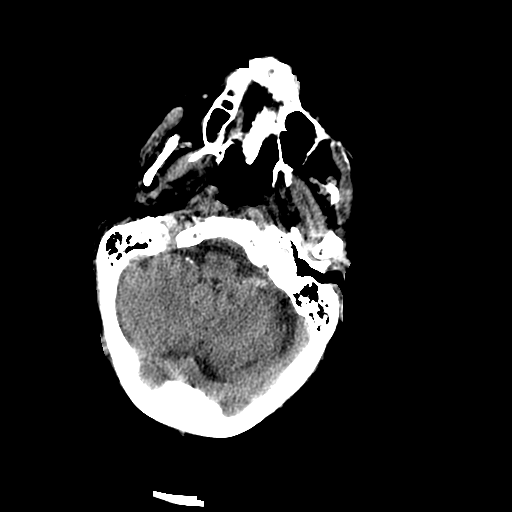
[im 9/32  brain]
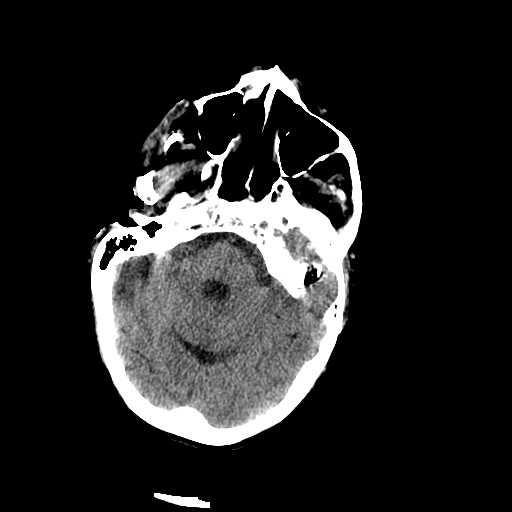
[im 12/32  brain]
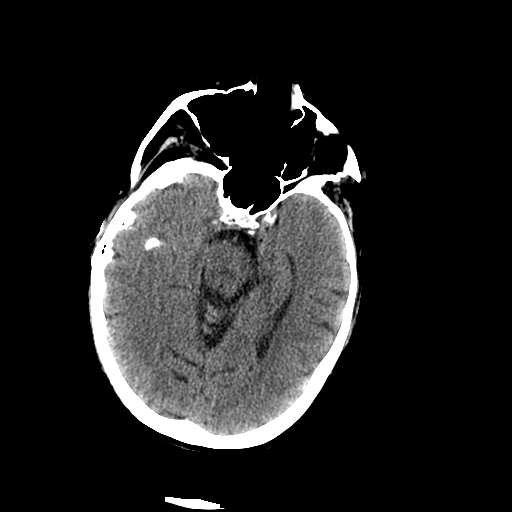
[im 12/32  bone]
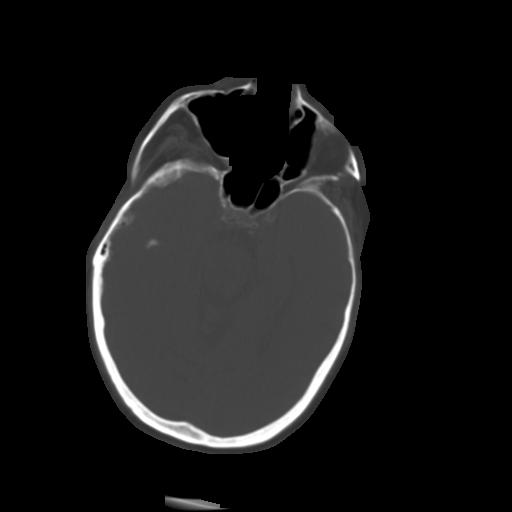
[im 14/32  brain]
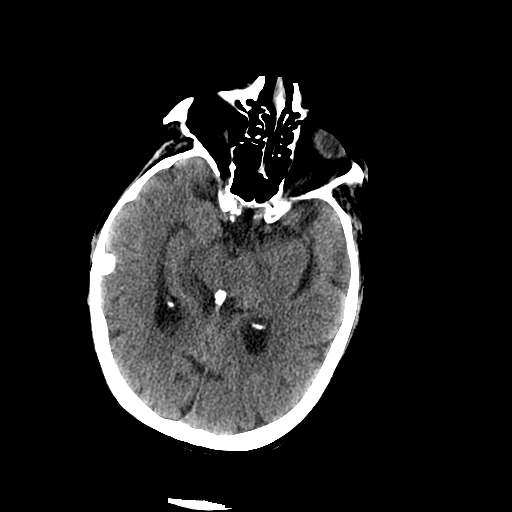
[im 16/32  brain]
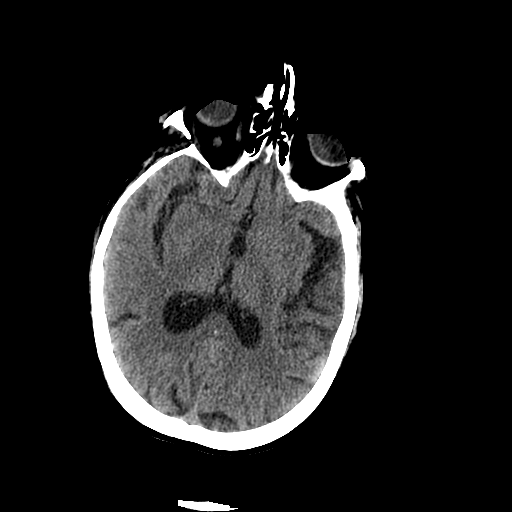
[im 18/32  brain]
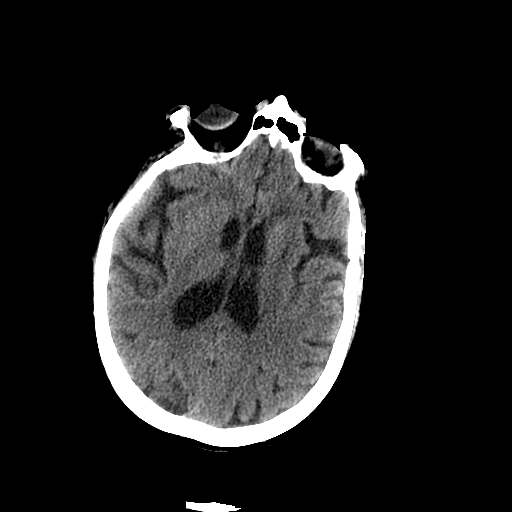
[im 20/32  brain]
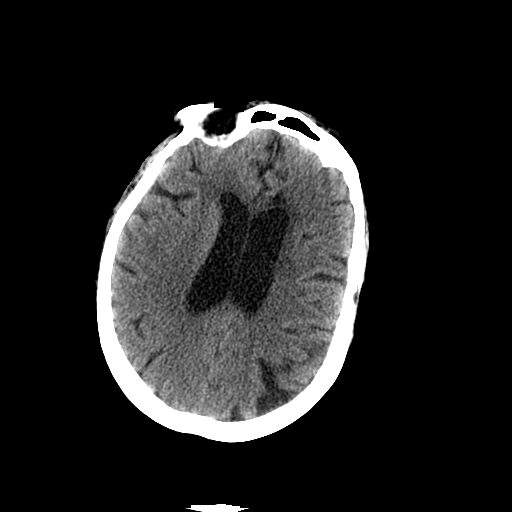
[im 20/32  bone]
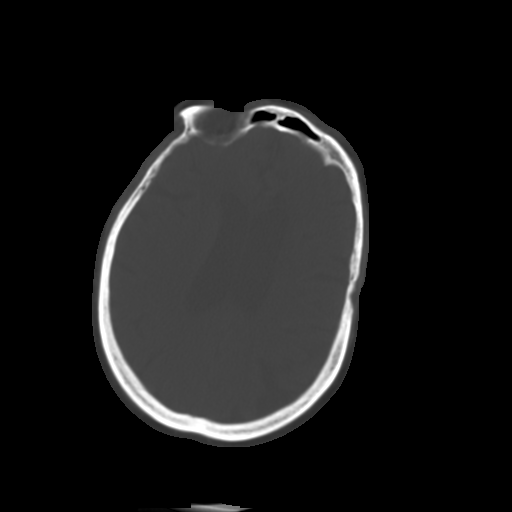
[im 23/32  brain]
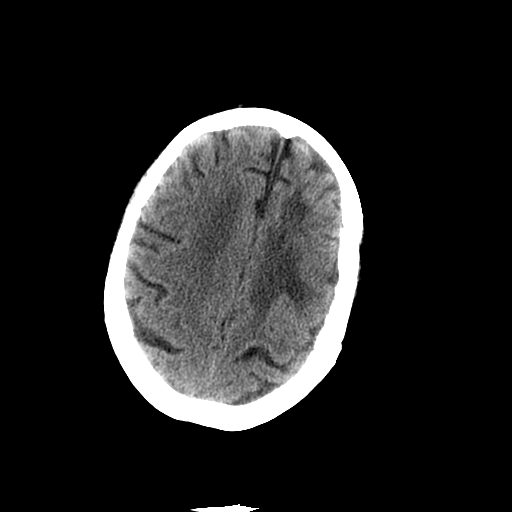
[im 25/32  brain]
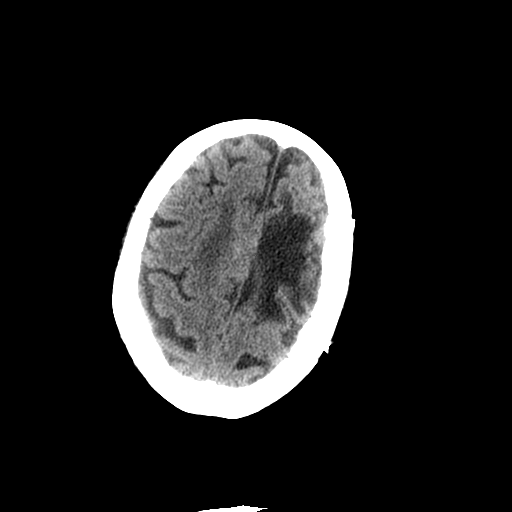
[im 27/32  brain]
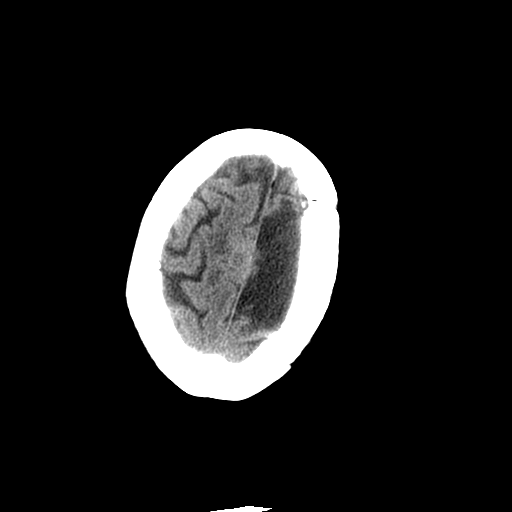
[im 29/32  brain]
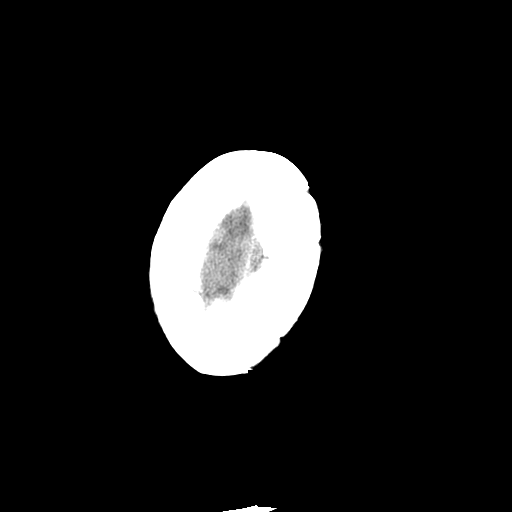
[im 29/32  bone]
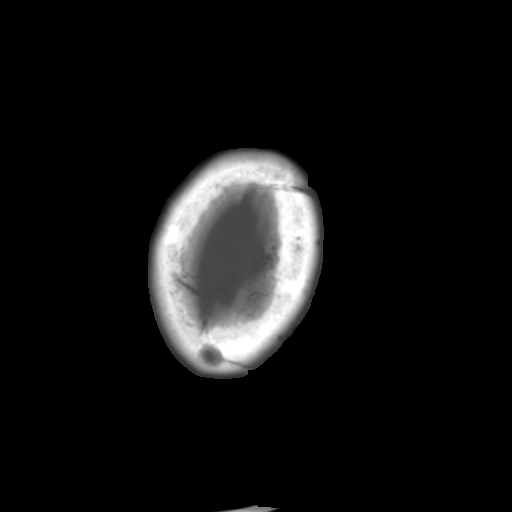

[13 of 30 positions shown; findings below may reference images not displayed]

FINDINGS: Status post left parietal craniotomy. Left frontal encephalomalacia
is again noted. Stable meningioma is noted in the right parafalcine
region. No mass effect or midline shift is noted. Ventricular size
is within normal limits. There is no evidence of hemorrhage or acute
infarction. Stable calcified meningioma is noted in right temporal
region.
IMPRESSION: Stable right-sided meningioma as an left-sided encephalomalacia is
noted compared to prior exam. No significant changes noted compared
to prior exam.

## 2015-11-30 IMAGING — CR DG CHEST 1V PORT
1 series · 1 of 1 positions shown · non-contrast
Comparison: 07/25/2015.

CLINICAL DATA: Pneumonia.  Shortness of breath.

EXAM:
PORTABLE CHEST 1 VIEW

[AP]
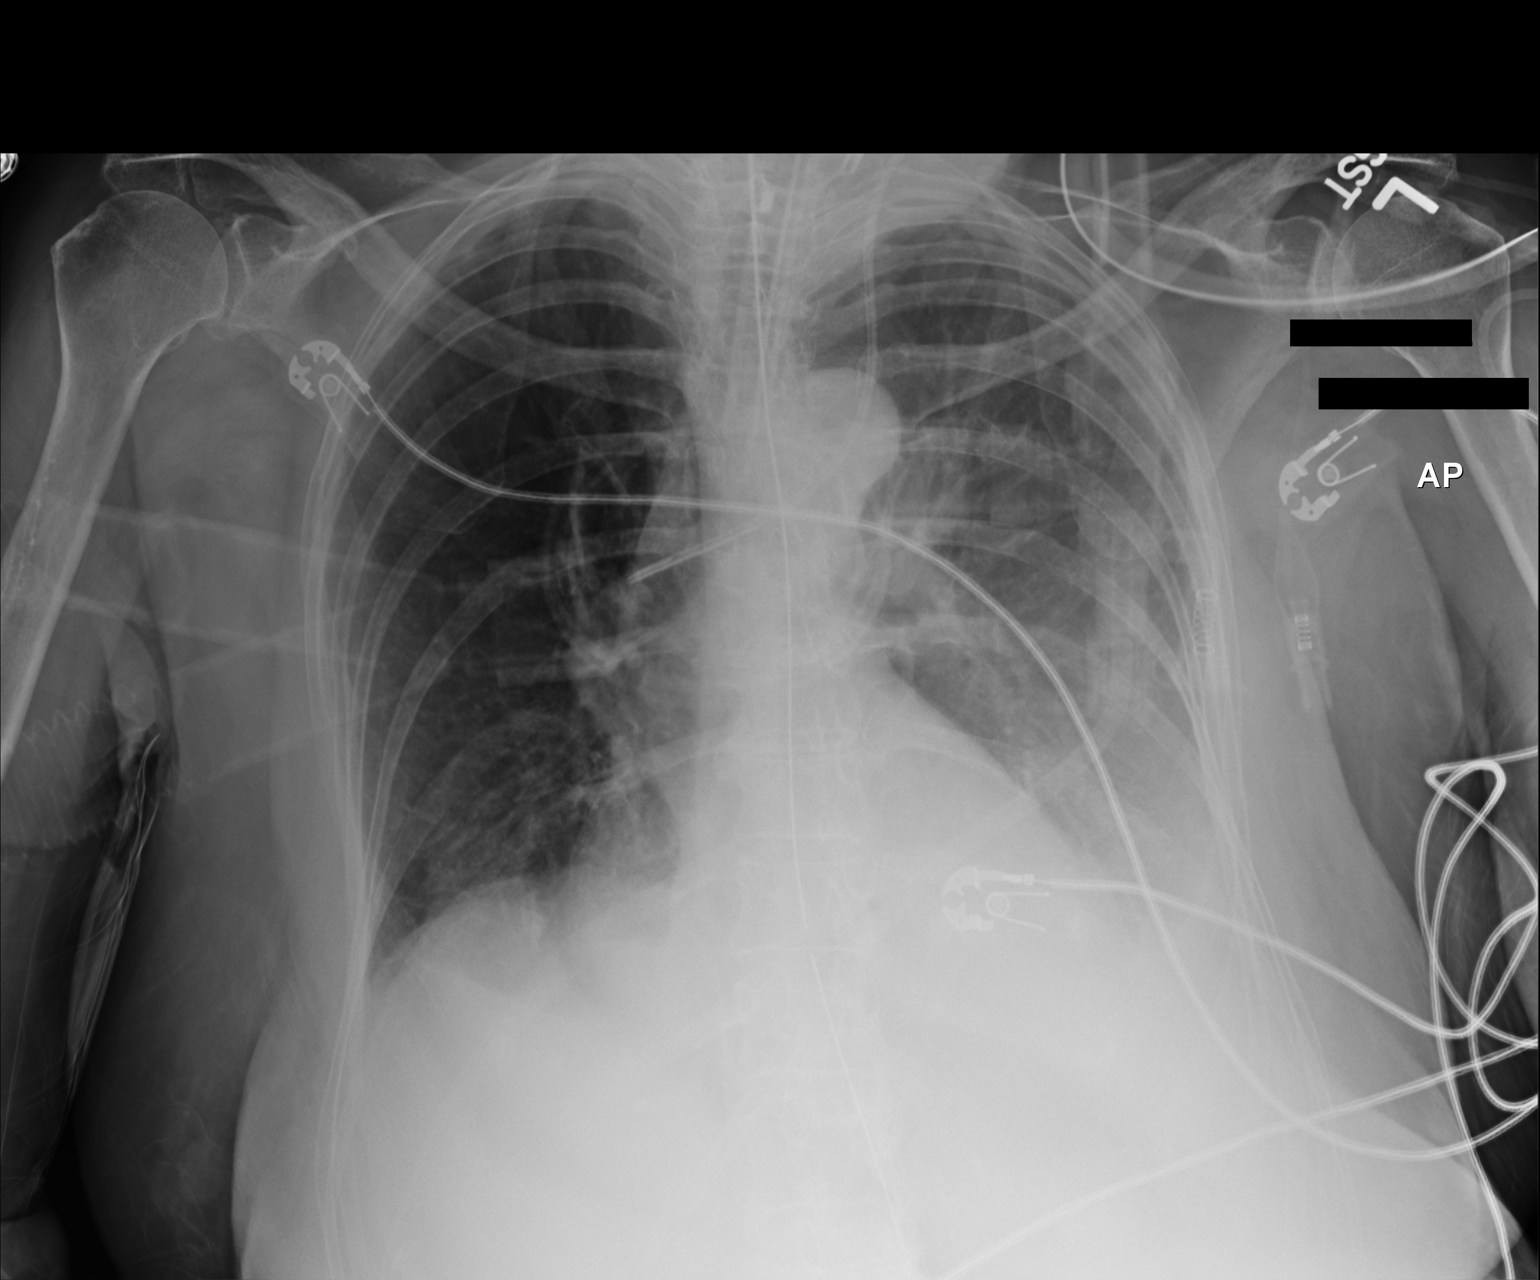

[1 of 1 positions shown; findings below may reference images not displayed]

FINDINGS: Endotracheal tube, NG tube, left IJ line stable position. Heart size
normal. Persistent left lower lobe infiltrate left pleural effusion.
No pneumothorax.
IMPRESSION: 1. Lines and tubes in stable position.
2. Persistent left lower lobe infiltrate and left pleural effusion.

## 2015-12-01 IMAGING — CR DG CHEST 1V PORT
1 series · 1 of 1 positions shown · non-contrast
Comparison: 07/26/2015

CLINICAL DATA: Volume excess

EXAM:
PORTABLE CHEST 1 VIEW

[AP]
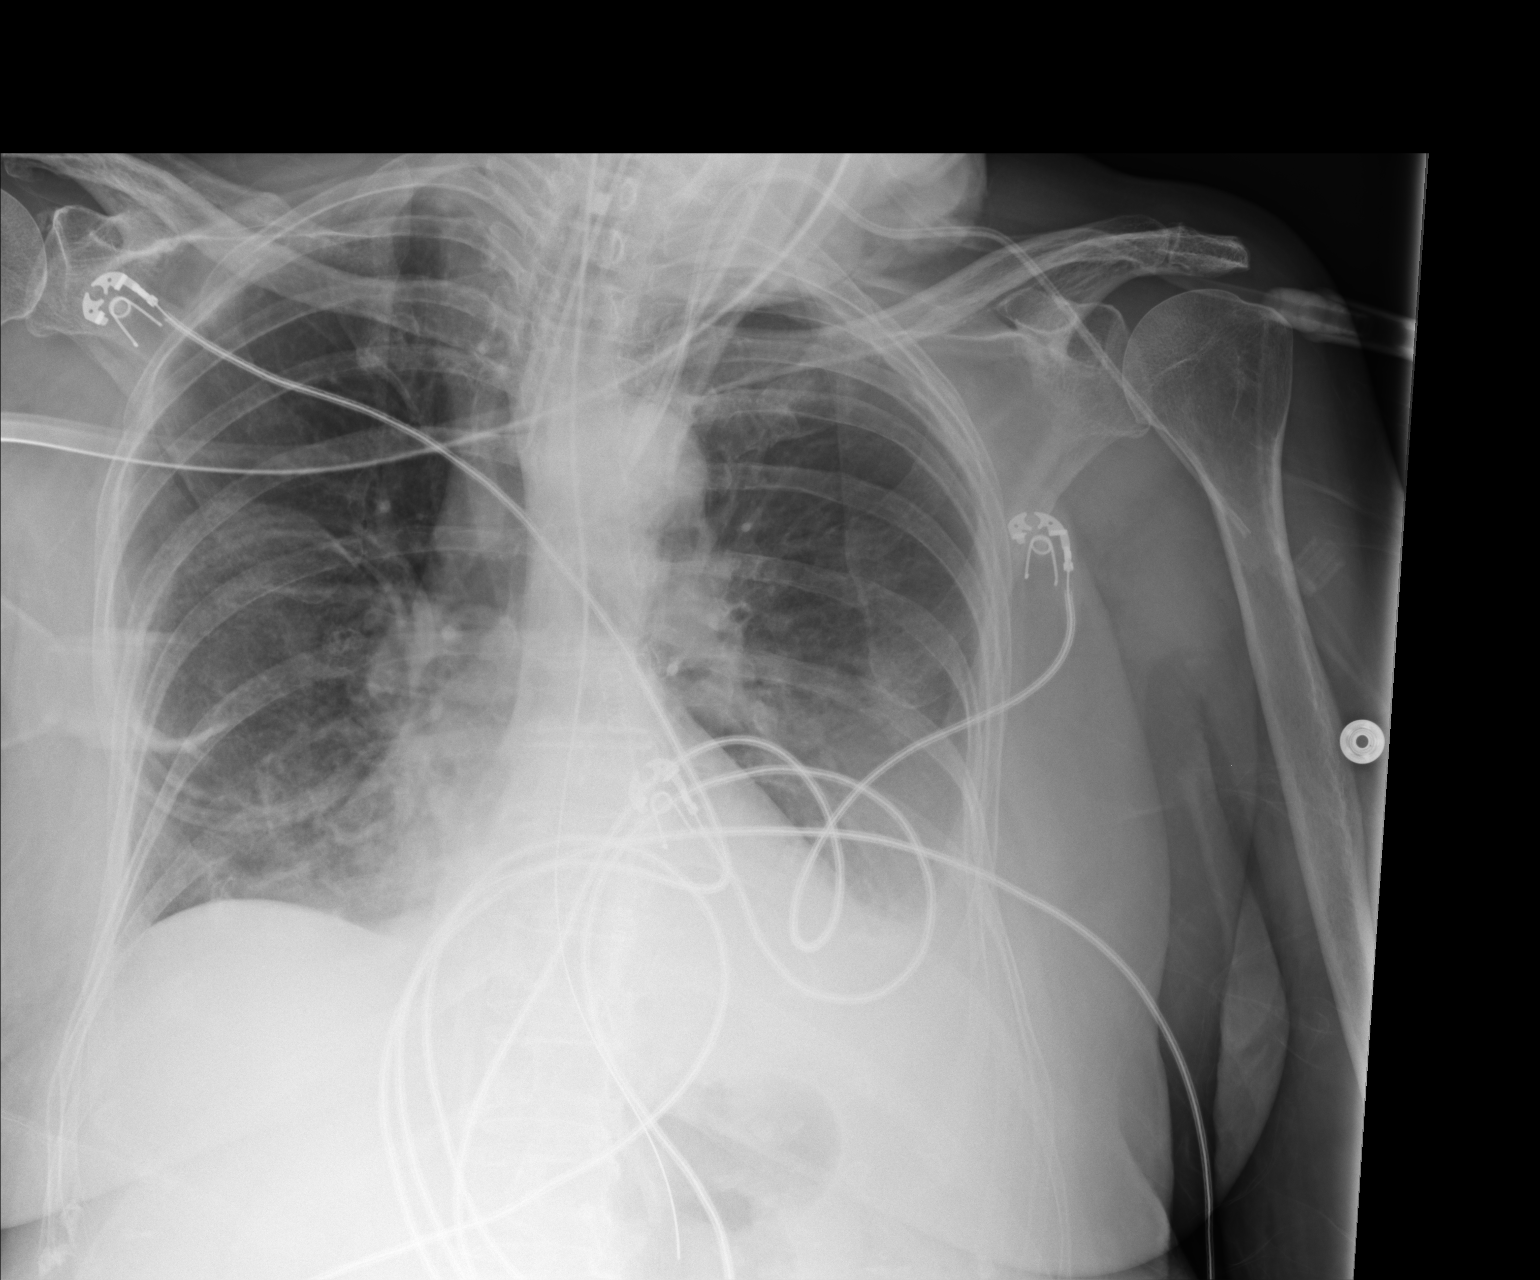

[1 of 1 positions shown; findings below may reference images not displayed]

FINDINGS: Endotracheal tube, left IJ central line, and nasogastric tube are in
place as before.

Heart contours are obscured. Bibasilar opacities appear stable.
Bilateral pleural effusions and bilateral lower lobe opacities are
present, left greater than right.
IMPRESSION: Bilateral pleural effusions and bibasilar opacity, left greater than
right.
